# Patient Record
Sex: Female | Born: 1935 | Race: White | Hispanic: No | Smoking: Former smoker
Health system: Southern US, Community
[De-identification: ages and names within clinical notes are randomized; demographics above are authoritative.]

## PROBLEM LIST (undated history)

## (undated) DIAGNOSIS — I10 Essential (primary) hypertension: Secondary | ICD-10-CM

## (undated) DIAGNOSIS — G4700 Insomnia, unspecified: Secondary | ICD-10-CM

## (undated) DIAGNOSIS — E785 Hyperlipidemia, unspecified: Secondary | ICD-10-CM

## (undated) DIAGNOSIS — Z860100 Personal history of colon polyps, unspecified: Secondary | ICD-10-CM

## (undated) DIAGNOSIS — M1990 Unspecified osteoarthritis, unspecified site: Secondary | ICD-10-CM

## (undated) DIAGNOSIS — F419 Anxiety disorder, unspecified: Secondary | ICD-10-CM

## (undated) DIAGNOSIS — Z8709 Personal history of other diseases of the respiratory system: Secondary | ICD-10-CM

## (undated) HISTORY — PX: DILATION AND CURETTAGE OF UTERUS: SHX78

## (undated) HISTORY — DX: Unspecified osteoarthritis, unspecified site: M19.90

## (undated) HISTORY — DX: Anxiety disorder, unspecified: F41.9

## (undated) HISTORY — PX: OTHER SURGICAL HISTORY: SHX169

## (undated) HISTORY — PX: TUBAL LIGATION: SHX77

## (undated) HISTORY — DX: Hyperlipidemia, unspecified: E78.5

## (undated) HISTORY — PX: POLYPECTOMY: SHX149

## (undated) HISTORY — DX: Insomnia, unspecified: G47.00

## (undated) HISTORY — DX: Personal history of colon polyps, unspecified: Z86.0100

## (undated) HISTORY — DX: Personal history of other diseases of the respiratory system: Z87.09

## (undated) HISTORY — DX: Essential (primary) hypertension: I10

## (undated) HISTORY — DX: Personal history of colonic polyps: Z86.010

---

## 1998-11-27 ENCOUNTER — Ambulatory Visit (HOSPITAL_COMMUNITY): Admission: RE | Admit: 1998-11-27 | Discharge: 1998-11-27 | Payer: Self-pay | Admitting: Gastroenterology

## 1998-11-27 ENCOUNTER — Encounter (INDEPENDENT_AMBULATORY_CARE_PROVIDER_SITE_OTHER): Payer: Self-pay | Admitting: *Deleted

## 2000-06-26 ENCOUNTER — Other Ambulatory Visit: Admission: RE | Admit: 2000-06-26 | Discharge: 2000-06-26 | Payer: Self-pay | Admitting: Obstetrics and Gynecology

## 2000-09-23 ENCOUNTER — Encounter (INDEPENDENT_AMBULATORY_CARE_PROVIDER_SITE_OTHER): Payer: Self-pay | Admitting: Specialist

## 2000-09-23 ENCOUNTER — Ambulatory Visit (HOSPITAL_BASED_OUTPATIENT_CLINIC_OR_DEPARTMENT_OTHER): Admission: RE | Admit: 2000-09-23 | Discharge: 2000-09-23 | Payer: Self-pay | Admitting: Plastic Surgery

## 2001-07-23 ENCOUNTER — Other Ambulatory Visit: Admission: RE | Admit: 2001-07-23 | Discharge: 2001-07-23 | Payer: Self-pay | Admitting: Obstetrics and Gynecology

## 2002-07-27 ENCOUNTER — Other Ambulatory Visit: Admission: RE | Admit: 2002-07-27 | Discharge: 2002-07-27 | Payer: Self-pay | Admitting: Obstetrics and Gynecology

## 2003-08-17 ENCOUNTER — Other Ambulatory Visit: Admission: RE | Admit: 2003-08-17 | Discharge: 2003-08-17 | Payer: Self-pay | Admitting: Obstetrics and Gynecology

## 2003-11-15 ENCOUNTER — Ambulatory Visit (HOSPITAL_COMMUNITY): Admission: RE | Admit: 2003-11-15 | Discharge: 2003-11-15 | Payer: Self-pay | Admitting: Internal Medicine

## 2004-01-25 ENCOUNTER — Ambulatory Visit: Payer: Self-pay | Admitting: Internal Medicine

## 2004-04-10 ENCOUNTER — Ambulatory Visit: Payer: Self-pay | Admitting: Internal Medicine

## 2004-05-03 ENCOUNTER — Ambulatory Visit: Payer: Self-pay | Admitting: Internal Medicine

## 2004-05-09 ENCOUNTER — Ambulatory Visit: Payer: Self-pay | Admitting: Internal Medicine

## 2004-07-10 ENCOUNTER — Ambulatory Visit: Payer: Self-pay | Admitting: Internal Medicine

## 2004-07-17 ENCOUNTER — Ambulatory Visit: Payer: Self-pay | Admitting: Internal Medicine

## 2004-08-20 ENCOUNTER — Other Ambulatory Visit: Admission: RE | Admit: 2004-08-20 | Discharge: 2004-08-20 | Payer: Self-pay | Admitting: Obstetrics and Gynecology

## 2004-11-13 ENCOUNTER — Ambulatory Visit: Payer: Self-pay | Admitting: Internal Medicine

## 2005-05-17 ENCOUNTER — Ambulatory Visit: Payer: Self-pay | Admitting: Internal Medicine

## 2005-07-18 ENCOUNTER — Ambulatory Visit: Payer: Self-pay | Admitting: Internal Medicine

## 2005-07-26 ENCOUNTER — Ambulatory Visit: Payer: Self-pay | Admitting: Internal Medicine

## 2005-11-22 ENCOUNTER — Ambulatory Visit: Payer: Self-pay | Admitting: Internal Medicine

## 2006-03-25 ENCOUNTER — Encounter: Payer: Self-pay | Admitting: Internal Medicine

## 2006-09-15 ENCOUNTER — Ambulatory Visit: Payer: Self-pay | Admitting: Internal Medicine

## 2006-10-07 ENCOUNTER — Encounter: Payer: Self-pay | Admitting: Internal Medicine

## 2006-10-10 ENCOUNTER — Ambulatory Visit: Payer: Self-pay | Admitting: Internal Medicine

## 2006-10-10 LAB — CONVERTED CEMR LAB
Alkaline Phosphatase: 43 units/L (ref 39–117)
BUN: 18 mg/dL (ref 6–23)
Basophils Absolute: 0 10*3/uL (ref 0.0–0.1)
Calcium: 9.7 mg/dL (ref 8.4–10.5)
Chloride: 101 meq/L (ref 96–112)
Cholesterol: 208 mg/dL (ref 0–200)
Creatinine, Ser: 0.9 mg/dL (ref 0.4–1.2)
Direct LDL: 118 mg/dL
GFR calc Af Amer: 80 mL/min
Glucose, Bld: 107 mg/dL — ABNORMAL HIGH (ref 70–99)
HCT: 39 % (ref 36.0–46.0)
Leukocytes, UA: NEGATIVE
MCV: 93.5 fL (ref 78.0–100.0)
Monocytes Absolute: 0.6 10*3/uL (ref 0.2–0.7)
Monocytes Relative: 10 % (ref 3.0–11.0)
Neutrophils Relative %: 43.7 % (ref 43.0–77.0)
Nitrite: NEGATIVE
Potassium: 4.2 meq/L (ref 3.5–5.1)
RDW: 11.6 % (ref 11.5–14.6)
Sodium: 138 meq/L (ref 135–145)
TSH: 1.05 microintl units/mL (ref 0.35–5.50)
Total Bilirubin: 1.1 mg/dL (ref 0.3–1.2)
Triglycerides: 144 mg/dL (ref 0–149)
Urobilinogen, UA: 0.2 (ref 0.0–1.0)
pH: 7 (ref 5.0–8.0)

## 2007-03-17 ENCOUNTER — Encounter: Payer: Self-pay | Admitting: *Deleted

## 2007-03-17 DIAGNOSIS — I10 Essential (primary) hypertension: Secondary | ICD-10-CM | POA: Insufficient documentation

## 2007-03-17 DIAGNOSIS — M1990 Unspecified osteoarthritis, unspecified site: Secondary | ICD-10-CM | POA: Insufficient documentation

## 2007-03-17 DIAGNOSIS — E785 Hyperlipidemia, unspecified: Secondary | ICD-10-CM

## 2007-03-17 DIAGNOSIS — G4700 Insomnia, unspecified: Secondary | ICD-10-CM | POA: Insufficient documentation

## 2007-03-17 DIAGNOSIS — J45909 Unspecified asthma, uncomplicated: Secondary | ICD-10-CM

## 2007-03-17 DIAGNOSIS — D126 Benign neoplasm of colon, unspecified: Secondary | ICD-10-CM

## 2007-03-17 DIAGNOSIS — Z9889 Other specified postprocedural states: Secondary | ICD-10-CM

## 2007-03-17 DIAGNOSIS — F418 Other specified anxiety disorders: Secondary | ICD-10-CM

## 2007-03-27 ENCOUNTER — Ambulatory Visit: Payer: Self-pay | Admitting: Internal Medicine

## 2007-03-27 DIAGNOSIS — M545 Low back pain, unspecified: Secondary | ICD-10-CM | POA: Insufficient documentation

## 2007-03-31 ENCOUNTER — Telehealth: Payer: Self-pay | Admitting: Internal Medicine

## 2007-04-03 ENCOUNTER — Encounter: Payer: Self-pay | Admitting: Internal Medicine

## 2007-04-10 ENCOUNTER — Ambulatory Visit: Payer: Self-pay | Admitting: Internal Medicine

## 2007-04-24 ENCOUNTER — Telehealth: Payer: Self-pay | Admitting: Internal Medicine

## 2007-04-28 ENCOUNTER — Ambulatory Visit: Payer: Self-pay | Admitting: Internal Medicine

## 2007-04-28 DIAGNOSIS — IMO0002 Reserved for concepts with insufficient information to code with codable children: Secondary | ICD-10-CM | POA: Insufficient documentation

## 2007-09-23 ENCOUNTER — Ambulatory Visit: Payer: Self-pay | Admitting: Internal Medicine

## 2007-09-23 LAB — CONVERTED CEMR LAB
ALT: 20 units/L (ref 0–35)
Albumin: 3.9 g/dL (ref 3.5–5.2)
Alkaline Phosphatase: 47 units/L (ref 39–117)
BUN: 15 mg/dL (ref 6–23)
Basophils Relative: 0.6 % (ref 0.0–3.0)
CO2: 29 meq/L (ref 19–32)
Cholesterol: 186 mg/dL (ref 0–200)
GFR calc Af Amer: 91 mL/min
GFR calc non Af Amer: 75 mL/min
HCT: 36.6 % (ref 36.0–46.0)
Hemoglobin: 12.9 g/dL (ref 12.0–15.0)
Ketones, ur: NEGATIVE mg/dL
Leukocytes, UA: NEGATIVE
MCHC: 35.2 g/dL (ref 30.0–36.0)
Monocytes Relative: 8.7 % (ref 3.0–12.0)
Neutro Abs: 2.7 10*3/uL (ref 1.4–7.7)
Platelets: 242 10*3/uL (ref 150–400)
RBC: 3.92 M/uL (ref 3.87–5.11)
RDW: 11.6 % (ref 11.5–14.6)
Specific Gravity, Urine: 1.015 (ref 1.000–1.03)
Total Bilirubin: 0.9 mg/dL (ref 0.3–1.2)
Total CHOL/HDL Ratio: 3.6
Total Protein, Urine: NEGATIVE mg/dL
Urobilinogen, UA: 0.2 (ref 0.0–1.0)
VLDL: 28 mg/dL (ref 0–40)
pH: 7 (ref 5.0–8.0)

## 2007-09-30 ENCOUNTER — Ambulatory Visit: Payer: Self-pay | Admitting: Internal Medicine

## 2007-10-01 ENCOUNTER — Encounter: Payer: Self-pay | Admitting: Internal Medicine

## 2008-02-01 ENCOUNTER — Ambulatory Visit: Payer: Self-pay | Admitting: Internal Medicine

## 2008-03-28 ENCOUNTER — Encounter: Payer: Self-pay | Admitting: Internal Medicine

## 2008-09-30 ENCOUNTER — Encounter: Payer: Self-pay | Admitting: Internal Medicine

## 2008-10-03 ENCOUNTER — Ambulatory Visit: Payer: Self-pay | Admitting: Internal Medicine

## 2008-10-03 LAB — CONVERTED CEMR LAB
ALT: 18 units/L (ref 0–35)
AST: 22 units/L (ref 0–37)
Albumin: 4.2 g/dL (ref 3.5–5.2)
BUN: 19 mg/dL (ref 6–23)
Basophils Absolute: 0 10*3/uL (ref 0.0–0.1)
Bilirubin, Direct: 0.1 mg/dL (ref 0.0–0.3)
Chloride: 106 meq/L (ref 96–112)
Cholesterol: 193 mg/dL (ref 0–200)
Eosinophils Absolute: 0.1 10*3/uL (ref 0.0–0.7)
Glucose, Bld: 108 mg/dL — ABNORMAL HIGH (ref 70–99)
HCT: 38.3 % (ref 36.0–46.0)
MCHC: 34.8 g/dL (ref 30.0–36.0)
MCV: 93.7 fL (ref 78.0–100.0)
Platelets: 236 10*3/uL (ref 150.0–400.0)
Potassium: 4.2 meq/L (ref 3.5–5.1)
RDW: 11.6 % (ref 11.5–14.6)
Sodium: 142 meq/L (ref 135–145)
Total Bilirubin: 1 mg/dL (ref 0.3–1.2)
Total Protein: 7.3 g/dL (ref 6.0–8.3)
Triglycerides: 102 mg/dL (ref 0.0–149.0)
WBC: 5.3 10*3/uL (ref 4.5–10.5)

## 2008-11-15 ENCOUNTER — Ambulatory Visit: Payer: Self-pay | Admitting: Internal Medicine

## 2009-04-18 ENCOUNTER — Encounter (INDEPENDENT_AMBULATORY_CARE_PROVIDER_SITE_OTHER): Payer: Self-pay | Admitting: *Deleted

## 2009-10-25 ENCOUNTER — Encounter: Payer: Self-pay | Admitting: Internal Medicine

## 2009-10-25 ENCOUNTER — Ambulatory Visit: Payer: Self-pay | Admitting: Internal Medicine

## 2009-11-16 ENCOUNTER — Encounter: Payer: Self-pay | Admitting: Internal Medicine

## 2009-11-21 ENCOUNTER — Encounter (INDEPENDENT_AMBULATORY_CARE_PROVIDER_SITE_OTHER): Payer: Self-pay | Admitting: *Deleted

## 2009-12-26 ENCOUNTER — Encounter (INDEPENDENT_AMBULATORY_CARE_PROVIDER_SITE_OTHER): Payer: Self-pay | Admitting: *Deleted

## 2010-01-01 ENCOUNTER — Ambulatory Visit: Payer: Self-pay | Admitting: Internal Medicine

## 2010-01-10 ENCOUNTER — Ambulatory Visit: Payer: Self-pay | Admitting: Internal Medicine

## 2010-01-14 ENCOUNTER — Encounter: Payer: Self-pay | Admitting: Internal Medicine

## 2010-02-22 ENCOUNTER — Telehealth: Payer: Self-pay | Admitting: Internal Medicine

## 2010-03-02 ENCOUNTER — Telehealth: Payer: Self-pay | Admitting: Internal Medicine

## 2010-03-07 ENCOUNTER — Telehealth: Payer: Self-pay | Admitting: Internal Medicine

## 2010-03-18 LAB — CONVERTED CEMR LAB
ALT: 19 units/L (ref 0–35)
AST: 21 units/L (ref 0–37)
Alkaline Phosphatase: 47 units/L (ref 39–117)
BUN: 16 mg/dL (ref 6–23)
Basophils Absolute: 0 10*3/uL (ref 0.0–0.1)
Basophils Relative: 0.7 % (ref 0.0–3.0)
Bilirubin, Direct: 0.1 mg/dL (ref 0.0–0.3)
CO2: 29 meq/L (ref 19–32)
Calcium: 9.8 mg/dL (ref 8.4–10.5)
Chloride: 101 meq/L (ref 96–112)
Creatinine, Ser: 0.7 mg/dL (ref 0.4–1.2)
Direct LDL: 115.8 mg/dL
GFR calc non Af Amer: 90.05 mL/min (ref >60)
Hemoglobin: 13.4 g/dL (ref 12.0–15.0)
Lymphocytes Relative: 33.3 % (ref 12.0–46.0)
MCHC: 35.1 g/dL (ref 30.0–36.0)
MCV: 94.5 fL (ref 78.0–100.0)
Monocytes Absolute: 0.5 10*3/uL (ref 0.1–1.0)
Platelets: 263 10*3/uL (ref 150.0–400.0)
Potassium: 4 meq/L (ref 3.5–5.1)
Sodium: 140 meq/L (ref 135–145)
Total Bilirubin: 0.6 mg/dL (ref 0.3–1.2)
Total Protein: 7 g/dL (ref 6.0–8.3)
WBC: 6.1 10*3/uL (ref 4.5–10.5)

## 2010-03-20 NOTE — Letter (Signed)
Summary: Pre Visit Letter Revised  [facility]  [address] [place], [place] [address]   Phone: [phone]  Fax: [phone]        11/21/2009 MRN: [id number] [facility] [address] [place], [place]  [address]             Procedure Date:  01/10/2010   Welcome to the Gastroenterology Division at [facility].    You are scheduled to see a nurse for your pre-procedure visit on 01/01/2010 at 10:00AM on the 3rd floor at [facility], [address]  We ask that you try to arrive at our office 15 minutes prior to your appointment time to allow for check-in.  Please take a minute to review the attached form.  If you answer "Yes" to one or more of the questions on the first page, we ask that you call the person listed at your earliest opportunity.  If you answer "No" to all of the questions, please complete the rest of the form and bring it to your appointment.    Your nurse visit will consist of discussing your medical and surgical history, your immediate family medical history, and your medications.   If you are unable to list all of your medications on the form, please bring the medication bottles to your appointment and we will list them.  We will need to be aware of both prescribed and over the counter drugs.  We will need to know exact dosage information as well.    Please be prepared to read and sign documents such as consent forms, a financial agreement, and acknowledgement forms.  If necessary, and with your consent, a friend or relative is welcome to sit-in on the nurse visit with you.  Please bring your insurance card so that we may make a copy of it.  If your insurance requires a referral to see a specialist, please bring your referral form from your primary care physician.  No co-pay is required for this nurse visit.     If you cannot keep your appointment, please call [phone] to cancel or reschedule prior to your appointment  date.  This allows [place] the opportunity to schedule an appointment for another patient in need of care.    Thank you for choosing [facility] for your medical needs.  We appreciate the opportunity to care for you.  Please visit [place] at our website  to learn more about our practice.  Sincerely, The Gastroenterology Division

## 2010-03-20 NOTE — Assessment & Plan Note (Signed)
Summary: YEARLY/WILL COME FASTING/#/CD   Vital Signs:  Patient profile:   76 year old female Height:      60 inches Weight:      127 pounds BMI:     24.89 O2 Sat:      97 % on Room air Temp:     97.9 degrees F oral Pulse rate:   70 / minute BP sitting:   128 / 80  (left arm) Cuff size:   regular  O2 Flow:  Room air         Flu Vaccine Consent Questions     Do you have a history of severe allergic reactions to this vaccine? no    Any prior history of allergic reactions to egg and/or gelatin? no    Do you have a sensitivity to the preservative Thimersol? no    Do you have a past history of Guillan-Barre Syndrome? no    Do you currently have an acute febrile illness? no    Have you ever had a severe reaction to latex? no    Vaccine information given and explained to patient? yes    Are you currently pregnant? no    Lot Number:[id number]   Exp Date:08/17/2009   Site Given  Left Deltoid IM  CC: yearly/ ab  Vision Screening:      Vision Comments: June 2011 Normal at [facility]   Primary Care Provider:  Norins  CC:  yearly/ ab.  History of Present Illness: Patient presents for general health maintenance exam. she is feeling well and has no active complaints. 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 She will be seeing Dr. Senaida Ores for gyn in the next month.  She is very active - canning, seeing her grandchildren. She is 100% independent in  all ADLs. She manages her own affairs: shopping, keeping the household, keeping track of the achievements of her grandchildren.  Preventive Screening-Counseling & Management  Alcohol-Tobacco     Alcohol drinks/day: 1     Alcohol type: wine     Smoking Status: current     Year Quit: quit 33 years ago  Caffeine-Diet-Exercise     Caffeine use/day: 1 cup     Does Patient Exercise: yes     Type of exercise: walking     Times/week: 6  Hep-HIV-STD-Contraception     Dental Visit-last 6 months yes     Sun Exposure-Excessive: no  Safety-Violence-Falls     Seat Belt Use: yes     Firearms in the Home: firearms in the home     Smoke Detectors: yes     Violence in the Home: no risk noted     Sexual Abuse: no     Fall Risk: low fall risk  Current Medications (verified): 1)  Multivitamins   Tabs (Multiple Vitamin) .... Take One Tablet Once Daily 2)  Lotrel 10-20 Mg  Caps (Amlodipine Besy-Benazepril Hcl) .... Take One Tablet Once Daily 3)  Hydrochlorothiazide 25 Mg  Tabs (Hydrochlorothiazide) .... Take One Tablet Once Daily 4)  Calcium 600 1500 Mg  Tabs (Calcium Carbonate) .... Take 1 Tablet By Mouth Two Times A Day 5)  Aspirin 81 Mg  Tabs (Aspirin) .... Take One Tablet Once Daily 6)  Glucosamine Chondroitin Complx   Tabs (Glucosamine-Chondroit-Vit C-Mn) .... Take One Tablet Twice Daily 7)  Lipitor 10 Mg  Tabs (Atorvastatin Calcium) .... 1/2 Per Day  Allergies (verified): No Known Drug Allergies  Past History:  Past Medical History: Last updated: 03/27/2007 Hx of INSOMNIA (ICD-780.52) Hx of ANXIETY (ICD-300.00) Hx of POLYP, COLON (ICD-211.3) DEGENERATIVE JOINT DISEASE (ICD-715.90) HYPERLIPIDEMIA (ICD-272.4) Hx of ASTHMA (ICD-493.90) HYPERTENSION (ICD-401.9) Osteopenia  Past Surgical History: Last updated: 03/17/2007 POLYPECTOMY, HX OF (ICD-V15.9) DILATION AND CURETTAGE, HX OF (ICD-V45.89) TUBAL LIGATION, HX OF (ICD-V26.51)    Family History: Last updated:  Oct 11, 2007 father- deceased @60 : colon cancer, had had a blow to the abdomen in an MVA mother - deceased @ 44: GI issues/cancer, HTN Neg- breast cancer; DM  Social History: Last updated: 2007/10/11 HSG Married '61 2 sons - '69, '78; 2 daughtes - '63, '65; 4 grandchildren 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    Physical Exam  General:  Well-developed,well-nourished,in no acute distress; alert,appropriate and cooperative throughout examination Head:  Normocephalic and atraumatic without obvious abnormalities.  No apparent alopecia or balding. Eyes:  No corneal or conjunctival inflammation noted. EOMI. Perrla. Funduscopic exam benign, without hemorrhages, exudates or papilledema. Vision grossly normal. Ears:  External ear exam shows no significant lesions or deformities.  Otoscopic examination reveals clear canals, tympanic membranes are intact bilaterally without bulging, retraction, inflammation or discharge. Hearing is grossly normal bilaterally. Nose:  no external deformity and no external erythema.   Mouth:  Oral mucosa and oropharynx without lesions or exudates.  Teeth in good repair. Neck:  supple, full ROM, no thyromegaly, no carotid bruits, and no cervical lymphadenopathy.   Chest Wall:  No deformities, masses, or tenderness noted. Breasts:  deferred to gyn  Lungs:  Normal respiratory effort, chest expands symmetrically. Lungs are clear to auscultation, no crackles or  wheezes. Heart:  normal rate, regular rhythm, no murmur, no gallop, no JVD, and no HJR.   Abdomen:  soft, non-tender, normal bowel sounds, no guarding, and no hepatomegaly.   Genitalia:  deferred to gyn  Msk:  enlarged PIP/DIP joints. Trigger finger 4th right. normal ROM and no joint warmth.   Pulses:  2+ radial and DP pulses Extremities:  No clubbing, cyanosis, edema, or deformity noted with normal full range of motion of all joints.   Neurologic:  alert & oriented X3, cranial nerves II-XII intact, strength normal in all extremities, gait normal, and DTRs symmetrical and normal.   Skin:  macular lesion right and left cheeks. No other suspicious lesions Cervical Nodes:  no anterior cervical adenopathy and no posterior cervical adenopathy.   Psych:  Oriented X3, memory intact for recent and remote, normally interactive, and good eye contact.     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 Shehas no limitations in her activities.  Her updated medication list for this problem includes:    Aspirin 81 Mg Tabs (Aspirin) .Marland Kitchen... Take one tablet once daily  Problem # 2:  Hx of ANXIETY (ICD-300.00) Although her home situation is challenging to her she is doing well. She continues to do  activities that please her.  Problem # 3:  DEGENERATIVE JOINT DISEASE (ICD-715.90) No active complaints at this visit. She remains very active.  Her updated medication list for this problem includes:    Aspirin 81 Mg Tabs (Aspirin) .Marland Kitchen... Take one tablet once daily  Problem # 4:  HYPERLIPIDEMIA (ICD-272.4) Due for follow-up lab with recommendations to follow.  Her updated medication list for this problem includes:    Lipitor 10 Mg Tabs (Atorvastatin calcium) .Marland Kitchen... 1/2 per day  Orders: TLB-Lipid Panel (80061-LIPID) TLB-Hepatic/Liver Function Pnl (80076-HEPATIC) TLB-TSH (Thyroid Stimulating Hormone) (84443-TSH)  Addendum - LDL at 115 is at goal. Liver  functions are normal.  Plan - continue resent medications.  Problem # 5:  HYPERTENSION (ICD-401.9)  Her updated medication list for this problem includes:    Lotrel 10-20 Mg Caps (Amlodipine besy-benazepril hcl) .Marland Kitchen... Take one tablet once daily    Hydrochlorothiazide 25 Mg Tabs (Hydrochlorothiazide) .Marland Kitchen... Take one tablet once daily  Orders: TLB-BMP (Basic Metabolic Panel-BMET) (80048-METABOL)  BP today: 128/80 Prior BP: 132/76 (10/03/2008)  Good control of BP. Labs are normal.  Plan - continue resent medications.  Problem # 6:  Preventive Health Care (ICD-V70.0)  Unremarkable history since her last visit. She does have very mild onychyomycosis of the the toe nails and wants treatment - lamisil 250mg  once daily x 12 weeks. Physical exam is normal- gyn exam deferred, breast exam deferred. She had colonoscopy '06.  She is cvurrent with pneumonia vaccine. Flu shot today. She is a candidate for shingles vaccine at a later date. 12 lead EKG normal  Ms. Wentworth  has a difficult relationship with her husband but has no signs or symptoms of depression. She is cognitively intact and independent in ADLs  as reviewed in HPI. She has no fall or injury risk. She is counselled to continue all her medications, to remain active and to possibly increase her aerobic exercise if possible, i.e. walking daily.  In summary - avery nice woman who is medically stable. She will return as needed or in 1 year.  Orders: [facility] -Subsequent Annual Wellness Visit [phone])  Complete Medication List: 1)  Multivitamins Tabs (Multiple vitamin) .... Take one tablet once daily 2)  Lotrel 10-20 Mg Caps (Amlodipine besy-benazepril hcl) .... Take one tablet once daily 3)  Hydrochlorothiazide 25 Mg Tabs (Hydrochlorothiazide) .... Take one tablet once daily 4)  Calcium 600 1500 Mg Tabs (Calcium carbonate) .... Take 1 tablet by mouth two times a day 5)  Aspirin 81 Mg Tabs (Aspirin) .... Take one tablet once daily 6)  Glucosamine  Chondroitin Complx Tabs (Glucosamine-chondroit-vit c-mn) .... Take one tablet twice daily 7)  Lipitor 10 Mg Tabs (Atorvastatin calcium) .... 1/2 per day 8)  Terbinafine Hcl 250 Mg Tabs (Terbinafine hcl) .Marland Kitchen.. 1 by mouth once daily x 12 weeks for nail fungus  Other Orders: Flu Vaccine 61yrs + (60454) Administration Flu vaccine - MCR (G0008) EKG w/ Interpretation (93000) TLB-CBC Platelet - w/Differential (85025-CBCD)   Patient: Whitney Benson Note: All result statuses are Final unless otherwise noted.  Tests: (1) Lipid Panel (LIPID)   Cholesterol          [H]  202 mg/dL                   0-981     ATP III Classification            Desirable:  < 200 mg/dL                    Borderline High:  200 - 239 mg/dL               High:  > = 240 mg/dL   Triglycerides             149.0 mg/dL                 1.9-147.8     Normal:  <150 mg/dL     Borderline High:  295 - 199 mg/dL   HDL                       62.13 mg/dL                 >08.65   VLDL Cholesterol          29.8 mg/dL                  7.8-46.9  CHO/HDL Ratio:  CHD Risk                             3                    Men          Women     1/2 Average Risk     3.4  3.3     Average Risk          5.0          4.4     2X Average Risk          9.6          7.1     3X Average Risk          15.0          11.0                           Tests: (2) Hepatic/Liver Function Panel (HEPATIC)   Total Bilirubin           0.6 mg/dL                   8.1-1.9   Direct Bilirubin          0.1 mg/dL                   1.4-7.8   Alkaline Phosphatase      47 U/L                      39-117   AST                       21 U/L                      0-37   ALT                       19 U/L                      0-35   Total Protein             7.0 g/dL                    2.9-5.6   Albumin                   4.3 g/dL                    2.1-3.0  Tests: (3) BMP (METABOL)   Sodium                    140 mEq/L                   135-145   Potassium                  4.0 mEq/L                   3.5-5.1   Chloride                  101 mEq/L                   96-112   Carbon Dioxide            29 mEq/L                    19-32   Glucose                   96 mg/dL  70-99   BUN                       16 mg/dL                    1-61   Creatinine                0.7 mg/dL                   0.9-6.0   Calcium                   9.8 mg/dL                   4.5-40.9   GFR                       90.05 mL/min                >60  Tests: (4) CBC Platelet w/Diff (CBCD)   White Cell Count          6.1 K/uL                    4.5-10.5   Red Cell Count            4.06 Mil/uL                 3.87-5.11   Hemoglobin                13.4 g/dL                   81.1-91.4   Hematocrit                38.4 %                      36.0-46.0   MCV                       94.5 fl                     78.0-100.0   MCHC                      35.1 g/dL                   78.2-95.6   RDW                       12.8 %                      11.5-14.6   Platelet Count            263.0 K/uL                  150.0-400.0   Neutrophil %              57.1 %                      43.0-77.0   Lymphocyte %              33.3 %                      12.0-46.0   Monocyte %  7.6 %                       3.0-12.0   Eosinophils%              1.3 %                       0.0-5.0   Basophils %               0.7 %                       0.0-3.0   Neutrophill Absolute      3.5 K/uL                    1.4-7.7   Lymphocyte Absolute       2.0 K/uL                    0.7-4.0   Monocyte Absolute         0.5 K/uL                    0.1-1.0  Eosinophils, Absolute                             0.1 K/uL                    0.0-0.7   Basophils Absolute        0.0 K/uL                    0.0-0.1  Tests: (5) TSH (TSH)   FastTSH                   0.86 uIU/mL                 0.35-5.50  Tests: (6) Cholesterol LDL - Direct (DIRLDL)  Cholesterol LDL - Direct                             115.8 mg/dL      Optimal:  <161 mg/dL     Near or Above Optimal:  100-129 mg/dL     Borderline High:  096-045 mg/dL     High:  409-811 mg/dLPrescriptions: TERBINAFINE HCL 250 MG TABS (TERBINAFINE HCL) 1 by mouth once daily x 12 weeks for nail fungus  #30 x 2   Entered and Authorized by:   Jacques Navy MD   Signed by:   Jacques Navy MD on 10/26/2009   Method used:   Electronically to        [facility]* (retail)       [address] [place], [place]  [address]       Ph: [phone] or [phone]       Fax: [phone]   RxID:   [id number] LIPITOR 10 MG  TABS (ATORVASTATIN CALCIUM) 1/2 per day  #15 x 12   Entered and Authorized by:   Jacques Navy MD   Signed by:   Jacques Navy MD on 10/25/2009   Method used:   Electronically to        [facility]* (retail)  [address] [place], [place]  [address]       Ph: [phone] or [phone]       Fax: [phone]   RxID:   [id number] HYDROCHLOROTHIAZIDE 25 MG  TABS (HYDROCHLOROTHIAZIDE) Take one tablet once daily  #30 x 12   Entered and Authorized by:   Jacques Navy MD   Signed by:   Jacques Navy MD on 10/25/2009   Method used:   Electronically to        [facility]* (retail)       [address] [place], [place]  [address]       Ph: [phone] or [phone]       Fax: [phone]   RxID:   [id number] LOTREL 10-20 MG  CAPS (AMLODIPINE BESY-BENAZEPRIL HCL) Take one tablet once daily  #30 x 12   Entered and Authorized by:   Jacques Navy MD   Signed by:   Jacques Navy MD on 10/25/2009   Method used:   Electronically to        [facility]* (retail)       [address] [place], [place]  [address]       Ph: [phone] or [phone]       Fax:  [phone]   RxID:   [id number]

## 2010-03-20 NOTE — Letter (Signed)
Summary: Patient Notice- Polyp Results  [facility]  [address] [place], [place] [address]   Phone: [phone]  Fax: [phone]        January 14, 2010 MRN: [id number]    [facility] [address] [place], [place]  [address]    Dear Ms. Mcclellan,  The polyp removed from your colon was adenomatous. This means that it was pre-cancerous or that  it had the potential to change into cancer over time.  I recommend that you have a repeat colonoscopy in 5 years to determine if you have developed any new polyps over time and screen for colorectal cancer. (If sensible considering your health at that time). If you develop any new rectal bleeding, abdominal pain or significant bowel habit changes, please contact us before then.  Please call us if you are having persistent problems or have questions about your condition that have not been fully answered at this time.  Sincerely,  Iva Boop MD, [facility]  This letter has been electronically signed by your physician.  Appended Document: Patient Notice- Polyp Results Letter mailed

## 2010-03-20 NOTE — Letter (Signed)
Summary: [facility] Instructions  [facility]  [address] [place], [place] [address]   Phone: [phone]  Fax: [phone]       Whitney Benson    1934/07/16    MRN: [id number]        Procedure Day Dorna Bloom:  Wednesday 01/10/2010     Arrival Time: 8:30 am      Procedure Time: 9:30 am     Location of Procedure:                    _x _  [facility] (4th Floor)                        PREPARATION FOR COLONOSCOPY WITH MOVIPREP   Starting 5 days prior to your procedure Friday 11/18 do not eat nuts, seeds, popcorn, corn, beans, peas,  salads, or any raw vegetables.  Do not take any fiber supplements (e.g. Metamucil, Citrucel, and Benefiber).  THE DAY BEFORE YOUR PROCEDURE         DATE: Tuesday 11/22  1.  Drink clear liquids the entire day-NO SOLID FOOD  2.  Do not drink anything colored red or purple.  Avoid juices with pulp.  No orange juice.  3.  Drink at least 64 oz. (8 glasses) of fluid/clear liquids during the day to prevent dehydration and help the prep work efficiently.  CLEAR LIQUIDS INCLUDE: Water Jello Ice Popsicles Tea (sugar ok, no milk/cream) Powdered fruit flavored drinks Coffee (sugar ok, no milk/cream) Gatorade Juice: apple, white grape, white cranberry  Lemonade Clear bullion, consomm, broth Carbonated beverages (any kind) Strained chicken noodle soup Hard Candy                             4.  In the morning, mix first dose of MoviPrep solution:    Empty 1 Pouch A and 1 Pouch B into the disposable container    Add lukewarm drinking water to the top line of the container. Mix to dissolve    Refrigerate (mixed solution should be used within 24 hrs)  5.  Begin drinking the prep at 5:00 p.m. The MoviPrep container is divided by 4 marks.   Every 15 minutes drink the solution down to the next mark (approximately 8 oz) until the full liter is complete.   6.  Follow completed prep with 16 oz of clear liquid of your choice  (Nothing red or purple).  Continue to drink clear liquids until bedtime.  7.  Before going to bed, mix second dose of MoviPrep solution:    Empty 1 Pouch A and 1 Pouch B into the disposable container    Add lukewarm drinking water to the top line of the container. Mix to dissolve    Refrigerate  THE DAY OF YOUR PROCEDURE      DATE: Wednesday 11/23  Beginning at 4:30 a.m. (5 hours before procedure):         1. Every 15 minutes, drink the solution down to the next mark (approx 8 oz) until the full liter is complete.  2. Follow completed prep with 16 oz. of clear liquid of your choice.    3. You may drink clear liquids until 7:30 am (2 HOURS BEFORE PROCEDURE).   MEDICATION INSTRUCTIONS  Unless otherwise instructed, you should take regular prescription medications with a small sip of water   as early as possible the morning of  your procedure.   Additional medication instructions: Do not take HCTZ day of procedure.         OTHER INSTRUCTIONS  You will need a responsible adult at least 74 years of age to accompany you and drive you home.   This person must remain in the waiting room during your procedure.  Wear loose fitting clothing that is easily removed.  Leave jewelry and other valuables at home.  However, you may wish to bring a book to read or  an iPod/MP3 player to listen to music as you wait for your procedure to start.  Remove all body piercing jewelry and leave at home.  Total time from sign-in until discharge is approximately 2-3 hours.  You should go home directly after your procedure and rest.  You can resume normal activities the  day after your procedure.  The day of your procedure you should not:   Drive   Make legal decisions   Operate machinery   Drink alcohol   Return to work  You will receive specific instructions about eating, activities and medications before you leave.    The above instructions have been reviewed and explained to me  by   Ezra Sites RN  January 01, 2010 10:08 AM    I fully understand and can verbalize these instructions _____________________________ Date _________

## 2010-03-20 NOTE — Miscellaneous (Signed)
Summary: [facility]  Clinical Lists Changes  Medications: Added new medication of MOVIPREP 100 GM  SOLR (PEG-KCL-NACL-NASULF-NA ASC-C) As per prep instructions. - Signed Rx of MOVIPREP 100 GM  SOLR (PEG-KCL-NACL-NASULF-NA ASC-C) As per prep instructions.;  #1 x 0;  Signed;  Entered by: Ezra Sites RN;  Authorized by: Iva Boop MD, FACG;  Method used: Electronically to [facility]*, [address] [place], [place]  [address], Ph: [phone] or [phone], Fax: [phone] Observations: Added new observation of NKA: T (01/01/2010 9:47)    Prescriptions: MOVIPREP 100 GM  SOLR (PEG-KCL-NACL-NASULF-NA ASC-C) As per prep instructions.  #1 x 0   Entered by:   Ezra Sites RN   Authorized by:   Iva Boop MD, [facility]   Signed by:   Ezra Sites RN on 01/01/2010   Method used:   Electronically to        [facility]* (retail)       [address] [place], [place]  [address]       Ph: [phone] or [phone]       Fax: [phone]   RxID:   [id number]

## 2010-03-20 NOTE — Procedures (Signed)
Summary: Colonoscopy  Patient: Whitney Benson Note: All result statuses are Final unless otherwise noted.  Tests: (1) Colonoscopy (COL)   COL Colonoscopy           DONE     [facility]     [address]     [place], [place]  [address]           COLONOSCOPY PROCEDURE REPORT           PATIENT:  Whitney Benson, Whitney Benson  MR#:  [id number]     BIRTHDATE:  01/24/1935, 75 yrs. old  GENDER:  female     ENDOSCOPIST:  Iva Boop, MD, [facility]           PROCEDURE DATE:  01/10/2010     PROCEDURE:  Colonoscopy with biopsy     ASA CLASS:  Class II     INDICATIONS:  Elevated Risk Screening, family history of colon     cancer father died at age 67 from colon cancer     MEDICATIONS:   Fentanyl 50 mcg IV, Versed 5 mg IV           DESCRIPTION OF PROCEDURE:   After the risks benefits and     alternatives of the procedure were thoroughly explained, informed     consent was obtained.  Digital rectal exam was performed and     revealed no abnormalities.   The LB CF-[id number] endoscope     was introduced through the anus and advanced to the cecum, which     was identified by both the appendix and ileocecal valve, without     limitations.  The quality of the prep was excellent, using     MoviPrep.  The instrument was then slowly withdrawn as the colon     was fully examined. Insertion: 5:00 minutes Withdrawal: 8:46     minutes     <<PROCEDUREIMAGES>>           FINDINGS:  A diminutive polyp was found in the ascending colon.     The polyp was removed using cold biopsy forceps.  Mild     diverticulosis was found in the sigmoid colon.  This was otherwise     a normal examination of the colon.   Retroflexed views in the     rectum revealed it was not tolerated by the patient.    The scope     was then withdrawn from the patient and the procedure completed.           COMPLICATIONS:  None     ENDOSCOPIC IMPRESSION:     1) Diminutive polyp in the ascending colon     2) Mild diverticulosis in the sigmoid  colon     3) Otherwise normal examination     4) Family history of colon cancer (father deceased at 61)           REPEAT EXAM:  In for Colonoscopy, pending biopsy results. likely 5     years           Iva Boop, MD, [facility]           CC:  The Patient           n.     eSIGNED:   Iva Boop at 01/10/2010 10:32 AM           Whitney Benson, [id number]  Note: An exclamation mark (!) indicates a result that was not dispersed into the flowsheet. Document Creation  Date: 01/10/2010 10:32 AM _______________________________________________________________________  (1) Order result status: Final Collection or observation date-time: 01/10/2010 10:23 Requested date-time:  Receipt date-time:  Reported date-time:  Referring Physician:   Ordering Physician: Stan Head [phone]) Specimen Source:  Source: Launa Grill Order Number: [id number] Lab site:   Appended Document: Colonoscopy   Colonoscopy  Procedure date:  01/10/2010  Findings:          1) Diminutive polyp in the ascending colon     2) Mild diverticulosis in the sigmoid colon     3) Otherwise normal examination     4) Family history of colon cancer (father deceased at 39)  1. Colon, polyp(s), ascending :  TUBULAR ADENOMA.  NO HIGH GRADE DYSPLASIA OR MALIGNANCY IDENTIFIED.  Comments:      Repeat colonoscopy in 5 years.   Procedures Next Due Date:    Colonoscopy: 01/2015   Appended Document: Colonoscopy     Procedures Next Due Date:    Colonoscopy: 01/2015

## 2010-03-20 NOTE — Letter (Signed)
Summary: Juluis Mire MD  Juluis Mire MD   Imported By: Lester Waterville 11/30/2009 09:53:14  _____________________________________________________________________  External Attachment:    Type:   Image     Comment:   External Document

## 2010-03-20 NOTE — Letter (Signed)
Summary: Colonoscopy Letter  [facility]  [address] [place], [place] [address]   Phone: [phone]  Fax: [phone]      April 18, 2009 MRN: [id number]   [facility] [address] [place], [place]  [address]   Dear Ms. Brancato,   According to your medical record, it is time for you to schedule a Colonoscopy. The American Cancer Society recommends this procedure as a method to detect early colon cancer. Patients with a family history of colon cancer, or a personal history of colon polyps or inflammatory bowel disease are at increased risk.  This letter has beeen generated based on the recommendations made at the time of your procedure. If you feel that in your particular situation this may no longer apply, please contact our office.  Please call our office at [phone] to schedule this appointment or to update your records at your earliest convenience.  Thank you for cooperating with [place] to provide you with the very best care possible.   Sincerely,  Iva Boop, M.D.  [facility] [phone]

## 2010-03-22 NOTE — Progress Notes (Signed)
Summary: med change?  Phone Note From Pharmacy Call back at [phone]   Caller: [facility] Summary of Call: [facility] called regarding pt's Lipitor rx. Insurance will no longer cover Lipitor and they are requesting pt be switched to Zocor Initial call taken by: Brenton Grills CMA Duncan Dull),  February 22, 2010 11:33 AM  Follow-up for Phone Call        ok to change to simvatatin 20mg  qPM  Follow-up by: Jacques Navy MD,  February 22, 2010 1:03 PM    New/Updated Medications: SIMVASTATIN 20 MG TABS (SIMVASTATIN) 1 by mouth qPM Prescriptions: SIMVASTATIN 20 MG TABS (SIMVASTATIN) 1 by mouth qPM  #30 x 12   Entered and Authorized by:   Jacques Navy MD   Signed by:   Jacques Navy MD on 02/22/2010   Method used:   Electronically to        [facility]* (retail)       [address] [place], [place]  [address]       Ph: [phone] or [phone]       Fax: [phone]   RxID:   [id number]

## 2010-03-22 NOTE — Progress Notes (Signed)
  Phone Note Call from Patient Call back at Home Phone [phone]   Caller: [phone] Call For: Jacques Navy MD Summary of Call: Pt out of Lipitor, spouse states form will be faxed today from [facility].Will need signed and sent back to drug store. Initial call taken by: Verdell Face,  March 02, 2010 9:41 AM  Follow-up for Phone Call        Pt advised that she spoke with [employer] and they will now cover Lipitor at $6/month and pt would like to be changed back. Okay to change rx and sent to [facility]? Follow-up by: Margaret Pyle, CMA,  March 02, 2010 10:27 AM  Additional Follow-up for Phone Call Additional follow up Details #1::        ok for lipitor 20mg  #30, refill x 12, sig 1 by mouth qPM. eScribed to drug store. Additional Follow-up by: Jacques Navy MD,  March 02, 2010 10:55 AM    Additional Follow-up for Phone Call Additional follow up Details #2::    Pt advised Follow-up by: Margaret Pyle, CMA,  March 02, 2010 1:56 PM  New/Updated Medications: LIPITOR 20 MG TABS (ATORVASTATIN CALCIUM) 1 by mouth qPM Prescriptions: LIPITOR 20 MG TABS (ATORVASTATIN CALCIUM) 1 by mouth qPM  #30 x 12   Entered and Authorized by:   Jacques Navy MD   Signed by:   Jacques Navy MD on 03/02/2010   Method used:   Electronically to        [facility] (retail)       [address] [place], [place]  [address]       Ph: [phone] or [phone]       Fax: [phone]   RxID:   [id number]

## 2010-03-28 NOTE — Progress Notes (Signed)
Summary: PA-Lipitor  Phone Note Call from Patient   Summary of Call: Pt and pharm called regarding lipitor. We did lipitor PA form yesterday.  Initial call taken by: Lamar Sprinkles, CMA,  March 07, 2010 11:34 AM  Follow-up for Phone Call        PA Faxed to [facility] @ [phone], awaiting approval. Dagoberto Reef  March 07, 2010 4:39 PM   PA WAS DENIED. Per pharmacy- pt can get this med for approx 45$. Pt was spoken to [employer] and they advised our office can get cost share reduction to get med for 6/mth. Request was denied.  Pt must have tried and failed pravastatin and lovastatin. Are there any clinical reasons why the patient should not take either of those options? If yes, we can resubmit the information to insurance for review, otherwise pt needs rx for either.   FYI- simvastatin may have same problem b/c insurance only prefers the 2 above mention options.  Follow-up by: Lamar Sprinkles, CMA,  March 17, 2010 1:59 PM  Additional Follow-up for Phone Call Additional follow up Details #1::        So...the patient has been very well controlled on lipitor but this is no longer covered!!   Plan - trial of lovastatin 40mg  once daily. repeat lipid panel in  3 weeks 272.4, hepatic 995.20. If she fails lovastatin will not try pravastatin an even weaker statin product., but apply again for PA. Additional Follow-up by: Jacques Navy MD,  March 18, 2010 6:03 PM    Additional Follow-up for Phone Call Additional follow up Details #2::    Spoke w/pt's husband - pharm filled simvastatin 20mg  (previously rx'd) w/no problems. Med list updated................Marland KitchenLamar Sprinkles, CMA  March 19, 2010 4:14 PM  Follow-up by: Jacques Navy MD,  March 19, 2010 5:49 PM  New/Updated Medications: SIMVASTATIN 20 MG TABS (SIMVASTATIN) 1 at bedtime

## 2010-03-30 ENCOUNTER — Other Ambulatory Visit: Payer: Medicare Other

## 2010-03-30 ENCOUNTER — Encounter (INDEPENDENT_AMBULATORY_CARE_PROVIDER_SITE_OTHER): Payer: Self-pay | Admitting: *Deleted

## 2010-03-30 ENCOUNTER — Other Ambulatory Visit: Payer: Self-pay | Admitting: Internal Medicine

## 2010-03-30 DIAGNOSIS — T887XXA Unspecified adverse effect of drug or medicament, initial encounter: Secondary | ICD-10-CM

## 2010-03-30 LAB — LIPID PANEL
HDL: 57.4 mg/dL (ref >39.00)
LDL Cholesterol: 96 mg/dL (ref 0–99)
Triglycerides: 179 mg/dL — ABNORMAL HIGH (ref 0.0–149.0)

## 2010-03-30 LAB — HEPATIC FUNCTION PANEL
ALT: 19 U/L (ref 0–35)
Total Protein: 6.5 g/dL (ref 6.0–8.3)

## 2010-04-08 ENCOUNTER — Encounter: Payer: Self-pay | Admitting: Internal Medicine

## 2010-04-17 NOTE — Letter (Signed)
[facility] [address] [place], [place]  [address] Phone: [phone]      April 09, 2010   [facility] [address] [place], [place] [address]  RE:  LAB RESULTS  Dear  [name],  The following is an interpretation of your most recent lab tests.  Please take note of any instructions provided or changes to medications that have resulted from your lab work.  LIVER FUNCTION TESTS:  Good - no changes needed  LIPID PANEL:  Good - no changes needed Triglyceride: 179.0   Cholesterol: 189   LDL: 96   HDL: 57.40   Chol/HDL%:  3   Great control of cholesterol. Keep up the good work and keep taking the medication.   Sincerely Yours,    Jacques Navy MD Patient: Whitney Benson Note: All result statuses are Final unless otherwise noted.  Tests: (1) Lipid Panel (LIPID)   Cholesterol               189 mg/dL                   2-831     ATP III Classification            Desirable:  < 200 mg/dL                    Borderline High:  200 - 239 mg/dL               High:  > = 240 mg/dL   Triglycerides        [H]  179.0 mg/dL                 5.1-761.6     Normal:  <150 mg/dL     Borderline High:  073 - 199 mg/dL   HDL                       71.06 mg/dL                 >26.94   VLDL Cholesterol          35.8 mg/dL                  8.5-46.2   LDL Cholesterol           96 mg/dL                    7-03  CHO/HDL Ratio:  CHD Risk                             3                    Men          Women     1/2 Average Risk     3.4          3.3     Average Risk          5.0          4.4     2X Average Risk          9.6          7.1     3X Average Risk          15.0          11.0  Tests: (2) Hepatic/Liver Function Panel (HEPATIC)   Total Bilirubin      [L]  0.2 mg/dL                   4.5-4.0   Direct Bilirubin          0.0 mg/dL                   9.8-1.1   Alkaline Phosphatase      49 U/L                      39-117   AST                        22 U/L                      0-37   ALT                       19 U/L                      0-35   Total Protein             6.5 g/dL                    9.1-4.7   Albumin                   3.7 g/dL                    8.2-9.5

## 2010-05-21 ENCOUNTER — Other Ambulatory Visit: Payer: Self-pay | Admitting: Internal Medicine

## 2010-05-24 ENCOUNTER — Other Ambulatory Visit: Payer: Self-pay | Admitting: Internal Medicine

## 2010-05-24 MED ORDER — DIAZEPAM 5 MG PO TABS: 5.0000 mg | ORAL_TABLET | Freq: Four times a day (QID) | ORAL | Status: AC | PRN

## 2010-07-03 NOTE — Assessment & Plan Note (Signed)
[facility]                           PRIMARY CARE OFFICE NOTE   NAME:Whitney Benson, Nevin                        MRN:          [id number]  DATE:09/15/2006                            DOB:          [birth year]    Ms. Whitney Benson is a 76 year old woman who presents for followup evaluation  and exam.  She is followed for hypertension, hyperlipidemia.   The patient was last seen in the office July 26, 2005 and in the interval  has had a good year with no significant medical problems or changes in  her condition.  She continues to have chronic low back pain.  She has  some hip pain, both relieved by Aleve.  She also complains of possible  decreased hearing.   PAST MEDICAL HISTORY:   SURGICAL:  1. BTL.  2. History of dilation and curettage.   MEDICAL:  1. Usual childhood disease.  2. Hypertension.  3. History of asthma, now quiescent.  4. Hyperlipidemia.  5. Degenerative joint disease.  6. History of colon polyps.  7. Anxiety and insomnia.   CURRENT MEDICATIONS:  1. Lipitor 5 mg daily.  2. Multivitamin daily.  3. Lotrel 10/20 once daily.  4. Hydrochlorothiazide 25 mg daily.  5. Viactiv 1 daily.  6. Aspirin 81 mg daily.  7. Glucosamine chondroitin.  8. Vitamin C daily.   FAMILY HISTORY:  Sister with ear, nose, and throat cancer, and lung  cancer.  No family history for colon cancer or breast cancer.   SOCIAL HISTORY:  The patient is married for greater than 42 years.  She  has 2 daughters, 2 sons, 2 grandchildren.  The patient's marriage is  stable.   HEALTH MAINTENANCE:  The patient's last bone density study dates from  2005, which showed mild osteopenia.  The patient reports that she is  current with mammography, but does not know the specific date.  Last  pelvic and Pap smear was August of 2007.  Last colonoscopy was May 03, 2004 with followup recommended in 2011.   IMMUNIZATIONS:  The patient has had pneumonia vaccine November 2000.  She had  a tetanus booster possibly 9 years ago.   PHYSICIAN ROSTER:  Dionne Ano. Amanda Pea, M.D. for orthopedics.  Juluis Mire, M.D. for gynecology.  John C. Madilyn Fireman, M.D. for GI.   REVIEW OF SYSTEMS:  The patient has had no constitutional symptoms.  Her  last eye exam was June of 2008 and she has no problems.  No ENT,  cardiovascular, respiratory, GI, GU, or neurologic complaints.   EXAMINATION:  Temperature 97.4, blood pressure 152/85, pulse 71, weight  127.  GENERAL APPEARANCE:  This is a well-developed, well-nourished Caucasian  woman in no acute distress.  HEENT:  Normocephalic, atraumatic.  EACs and TMs showed some cerumen,  but TMs are normal otherwise.  Oropharynx with native dentition.  No  buccal or palatal lesions were noted.  Posterior pharynx was clear.  Conjunctivae, sclerae clear.  PERRLA.  EOMI.  Funduscopic exam deferred  to recent ophthalm exam.  NECK:  Supple without thyromegaly.  NODES:  No adenopathy  was noted in the cervical or supraclavicular  regions.  CHEST:  Without deformity.  LUNGS:  Clear with no rales, wheezes, or rhonchi.  BREASTS:  Deferred to gynecology.  CARDIOVASCULAR:  2+ radial pulse.  No JVD or carotid bruit.  She had a  quiet precordium with regular rate and rhythm without murmurs, rubs, or  gallops.  ABDOMEN:  Soft.  No guarding or rebound.  No organo-splenomegaly was  noted.  PELVIC AND RECTAL:  Deferred to gynecology.  EXTREMITIES:  Without cyanosis, clubbing, or edema.  No deformity was  noted, although some early signs of osteoarthritis at the first MCP  joints and the second MCP joints.  SKIN:  Clear.  NEUROLOGIC:  Nonfocal.   No laboratory ordered at this time, and we will defer for 3 weeks.   ASSESSMENT AND PLAN:  1. Hypertension.  The patient's blood pressure is mildly elevated at      today's visit.  At the last visit, she was 148/76.  Plan:  The      patient will continue her present medications of Lotrel and      hydrochlorothiazide.   Will have her monitor her blood pressures at      home and notify me if her systolics are consistently greater than      140.  2. Hyperlipidemia.  The patient's last lipid panel showed a      cholesterol of 199, triglycerides 120, HDL 63, and LDL of 112 on      low-dose Lipitor.  She felt it might be contributing to her back      pain and hand pain, so she discontinued medication.  At this point,      I have advised her to resume medication.  We will recheck her      lipids in 3 weeks.  3. Pulmonary.  The patient is stable with no abnormalities on      examination.  4. Health maintenance.  The patient discontinued Fosamax.  She would      be due for a followup DEXA scan.  The patient is current with her      gynecologist for pelvic and Pap smears.   SUMMARY:  This is a pleasant woman with minor discomfort in her back and  hips.  She does get relief with Aleve and she is reassured that this is  adequate medication at this time.  She is to return to see me on a  p.r.n. basis.     Whitney Gess Norins, MD  Electronically Signed    MEN/MedQ  DD: 09/16/2006  DT: 09/16/2006  Job #: [id number]   cc:   [name]

## 2010-07-06 NOTE — Op Note (Signed)
[place]  Patient:    Whitney Benson, Whitney Benson                        MRN: [id number] Proc. Date: 09/23/00 Adm. Date:  [id number] Attending:  Eloise Levels                           Operative Report  PREOPERATIVE DIAGNOSIS:  Basal cell carcinoma, right posterior shoulder.  POSTOPERATIVE DIAGNOSIS:  Basal cell carcinoma, right posterior shoulder.  PROCEDURES: 1. Excision of 1.3 cm basal cell carcinoma, posterior right shoulder, with    intraoperative frozen section diagnosis. 2. Complex closure of 2.8 cm incision, right posterior shoulder.  SURGEON:  Mary A. Contogiannis, M.D.  ANESTHESIA:  1% lidocaine with epinephrine.  COMPLICATIONS:  None.  INDICATIONS FOR PROCEDURE:  The patient is a 76 year old Caucasian female who has a biopsy-proven basal cell carcinoma of the right posterior shoulder.  The lesion has been there for about six months and has not been healing over.  She presents at this point to undergo excision of the lesion with an intraoperative frozen section diagnosis.  DESCRIPTION OF PROCEDURE:  The patient was brought into the minor room and placed on the OR table in the prone position.  The right shoulder and posterior back were then prepped with Betadine and draped in sterile fashion. The skin and subcutaneous tissues in the area of the basal cell cancer were then injected with 1% lidocaine with epinephrine.  After adequate hemostasis and anesthesia had taken effect, the procedure was begun.  Using loupe magnification, the basal cell cancer was identified and a 2 mm margin of apparent clear skin was taken in a circumferential fashion around the cancer. This was done in elliptical fashion full-thickness through the skin and subcutaneous fatty tissues.  The specimen was then marked at the 12 oclock position and passed off the table to undergo intraoperative frozen section diagnosis.  After consultation with the pathologist, he  reported that there was a basal cell cancer in the specimen and that all the margins were clear. At this point the skin edges were then undermined for easy closure.  The wound was closed in complex fashion after meticulous hemostasis was obtained.  The deeper subcutaneous tissues were closed with 3-0 Monocryl sutures.  The skin was then closed using 3-0 Monocryl interrupted dermal sutures, followed by 4-0 Monocryl running intracuticular stitch on the skin.  The incision was then dressed with benzoin and Steri-Strips.  There were no complications.  The patient tolerated the procedure well.  She was then discharged home in stable condition in the care of her husband.  She was taught proper wound care prior to discharge.  A follow-up appointment will be tomorrow in the office. DD:  09/23/00 TD:  09/24/00 Job: [id number]

## 2010-07-27 ENCOUNTER — Telehealth: Payer: Self-pay | Admitting: *Deleted

## 2010-07-27 MED ORDER — DIAZEPAM 5 MG PO TABS: 5.0000 mg | ORAL_TABLET | Freq: Four times a day (QID) | ORAL | Status: AC | PRN

## 2010-07-27 NOTE — Telephone Encounter (Signed)
Ok for refill x 5 

## 2010-07-27 NOTE — Telephone Encounter (Signed)
Fax from [facility] for Diazepam 5mg  SIG take one tablet by mouth q 6 hours prn last fill was 05/24/2010. Please Advise

## 2010-07-27 NOTE — Telephone Encounter (Signed)
Called in RX 

## 2010-10-29 ENCOUNTER — Other Ambulatory Visit: Payer: Self-pay | Admitting: Internal Medicine

## 2010-11-09 ENCOUNTER — Encounter: Payer: Self-pay | Admitting: Internal Medicine

## 2010-11-12 ENCOUNTER — Ambulatory Visit (INDEPENDENT_AMBULATORY_CARE_PROVIDER_SITE_OTHER): Payer: Medicare Other | Admitting: Internal Medicine

## 2010-11-12 ENCOUNTER — Other Ambulatory Visit (INDEPENDENT_AMBULATORY_CARE_PROVIDER_SITE_OTHER): Payer: Medicare Other

## 2010-11-12 VITALS — BP 138/76 | HR 63 | Temp 98.0°F | Wt 126.0 lb

## 2010-11-12 DIAGNOSIS — I10 Essential (primary) hypertension: Secondary | ICD-10-CM

## 2010-11-12 DIAGNOSIS — M545 Low back pain: Secondary | ICD-10-CM

## 2010-11-12 DIAGNOSIS — Z0000 Encounter for general adult medical examination without abnormal findings: Secondary | ICD-10-CM

## 2010-11-12 DIAGNOSIS — E785 Hyperlipidemia, unspecified: Secondary | ICD-10-CM

## 2010-11-12 DIAGNOSIS — Z136 Encounter for screening for cardiovascular disorders: Secondary | ICD-10-CM

## 2010-11-12 LAB — COMPREHENSIVE METABOLIC PANEL
ALT: 18 U/L (ref 0–35)
Albumin: 4.2 g/dL (ref 3.5–5.2)
Alkaline Phosphatase: 48 U/L (ref 39–117)
Calcium: 9.1 mg/dL (ref 8.4–10.5)
Creatinine, Ser: 0.8 mg/dL (ref 0.4–1.2)
GFR: 73.38 mL/min (ref >60.00)
Sodium: 140 mEq/L (ref 135–145)
Total Protein: 7.1 g/dL (ref 6.0–8.3)

## 2010-11-12 LAB — HEPATIC FUNCTION PANEL
ALT: 18 U/L (ref 0–35)
AST: 22 U/L (ref 0–37)
Albumin: 4.2 g/dL (ref 3.5–5.2)
Alkaline Phosphatase: 48 U/L (ref 39–117)
Bilirubin, Direct: 0.1 mg/dL (ref 0.0–0.3)
Total Bilirubin: 0.8 mg/dL (ref 0.3–1.2)

## 2010-11-12 LAB — LIPID PANEL
Total CHOL/HDL Ratio: 3
Triglycerides: 118 mg/dL (ref 0.0–149.0)
VLDL: 23.6 mg/dL (ref 0.0–40.0)

## 2010-11-12 NOTE — Progress Notes (Signed)
Subjective:    Patient ID: Whitney Benson, female    DOB: 1934/07/16, 76 y.o.   MRN: [id number]  HPI   The patient is here for annual Medicare wellness examination and management of other chronic and acute problems. sdhe cpontinues to have chronic back pain but only takes meloxicam and valium on an as needed basis. She is not doing any back exercises   The risk factors are reflected in the social history.  The roster of all physicians providing medical care to patient - is listed in the Snapshot section of the chart.  Activities of daily living:  The patient is 100% inedpendent in all ADLs: dressing, toileting, feeding as well as independent mobility  Home safety : The patient has smoke detectors in the home. They wear seatbelts.No firearms at home ( firearms are present in the home, kept in a safe fashion). There is no violence in the home.   There is no risks for hepatitis, STDs or HIV. There is no   history of blood transfusion. They have no travel history to infectious disease endemic areas of the world.  The patient has not seen their dentist in the last six month. They have not seen their eye doctor in the last year. They admit to any hearing difficulty and have not had audiologic testing in the last year.  They do not  have excessive sun exposure. Discussed the need for sun protection: hats, long sleeves and use of sunscreen if there is significant sun exposure.   Diet: the importance of a healthy diet is discussed. They do have a healthy (unhealthy-high fat/fast food) diet.  The patient has a regular exercise program: walking, house work.  The benefits of regular aerobic exercise were discussed.  Depression screen: there are no signs or vegative symptoms of depression- irritability, change in appetite, anhedonia, sadness/tearfullness.  Cognitive assessment: the patient manages all their financial and personal affairs and is actively engaged. They could relate day,date,year and  events; recalled 3/3 objects at 3 minutes; performed clock-face test normally.  The following portions of the patient's history were reviewed and updated as appropriate: allergies, current medications, past family history, past medical history,  past surgical history, past social history  and problem list.  Vision, hearing, body mass index were assessed and reviewed.   During the course of the visit the patient was educated and counseled about appropriate screening and preventive services including : fall prevention , diabetes screening, nutrition counseling, colorectal cancer screening, and recommended immunizations.   Review of Systems Review of Systems  Constitutional:  Negative for fever, chills, activity change and unexpected weight change.  HEENT:  Negative for hearing loss, ear pain, congestion, neck stiffness and postnasal drip. Negative for sore throat or swallowing problems. Negative for dental complaints.   Eyes: Negative for vision loss or change in visual acuity.  Respiratory: Negative for chest tightness and wheezing.   Cardiovascular: Negative for chest pain and palpitation. No decreased exercise tolerance Gastrointestinal: No change in bowel habit. No bloating or gas. No reflux or indigestion Genitourinary: Negative for urgency, frequency, flank pain and difficulty urinating.  Musculoskeletal: Negative for myalgias, back pain, arthralgias and gait problem.  Neurological: Negative for dizziness, tremors, weakness and headaches.  Hematological: Negative for adenopathy.  Psychiatric/Behavioral: Negative for behavioral problems and dysphoric mood.       Objective:   Physical Exam Vitals reviewed. Gen'l: well nourished, well developed white woman in no distress HEENT - [place]/AT, EACs/TMs normal, oropharynx with native dentition in  good condition, no buccal or palatal lesions, posterior pharynx clear, mucous membranes moist. C&S clear, PERRLA, fundi - normal Neck - supple, no  thyromegaly Nodes- negative submental, cervical, supraclavicular regions Chest - no deformity, no CVAT Lungs - cleat without rales, wheezes. No increased work of breathing Breast - deferred to gyn  Cardiovascular - regular rate and rhythm, quiet precordium, no murmurs, rubs or gallops, 2+ radial, DP and PT pulses Abdomen - BS+ x 4, no HSM, no guarding or rebound or tenderness Pelvic - deferred to gyn Rectal - deferred to gyn Extremities - no clubbing, cyanosis, edema or deformity.  Neuro - A&O x 3, CN II-XII normal, motor strength normal and equal, DTRs 2+ and symmetrical biceps, radial, and patellar tendons. Cerebellar - no tremor, no rigidity, fluid movement and normal gait. Derm - Head, neck, back, abdomen and extremities without suspicious lesions  Lab Results  Component Value Date   WBC 6.1 10/25/2009   HGB 13.4 10/25/2009   HCT 38.4 10/25/2009   PLT 263.0 10/25/2009   CHOL 169 11/12/2010   TRIG 118.0 11/12/2010   HDL 65.90 11/12/2010   LDLDIRECT 115.8 10/25/2009   ALT 18 11/12/2010   ALT 18 11/12/2010   AST 22 11/12/2010   AST 22 11/12/2010   NA 140 11/12/2010   K 3.8 11/12/2010   CL 103 11/12/2010   CREATININE 0.8 11/12/2010   BUN 17 11/12/2010   CO2 28 11/12/2010   TSH 0.86 10/25/2009        Glucose                 93         Assessment & Plan:

## 2010-11-13 DIAGNOSIS — Z0000 Encounter for general adult medical examination without abnormal findings: Secondary | ICD-10-CM | POA: Insufficient documentation

## 2010-11-13 NOTE — Assessment & Plan Note (Signed)
Chronic low back pain that will wax and wane. For periods of increased pain, usually at night when supine, she will take meloxicam and diazepam.  Plan - provided with back exercise pamphlet for daily exercise           Continue with prn medications.

## 2010-11-13 NOTE — Assessment & Plan Note (Signed)
Excellent control with LDL below goal of 130. Tolerating medication w/o adverse effects  Plan - continue present medication

## 2010-11-13 NOTE — Assessment & Plan Note (Signed)
Interval history benign. Physical exam, sans pelvic and breast, is normal. Lab results are in normal limits. She is due for mammography with report requested. She is current w/ colorectal cancer screening. 12 Lead EKG is normal w/o evidence of ischemia or injury. Immunizations current.  In summary- a very nice woman who is medically stable and doing well. She is encouraged to do back exercises daily and to continue healthy diet. She will return as needed or in 1 year.

## 2010-11-13 NOTE — Assessment & Plan Note (Signed)
BP Readings from Last 3 Encounters:  11/12/10 138/76  10/25/09 128/80  10/03/08 132/76   Pattern of good control. Normal lab results.  Plan - continue present treatment

## 2011-03-19 ENCOUNTER — Other Ambulatory Visit: Payer: Self-pay | Admitting: Internal Medicine

## 2011-03-19 NOTE — Telephone Encounter (Signed)
Done

## 2011-04-15 ENCOUNTER — Other Ambulatory Visit: Payer: Self-pay | Admitting: Internal Medicine

## 2011-04-16 NOTE — Telephone Encounter (Signed)
Denied: Pt's current EMR shows simvastatin; no note in chart to state otherwise.

## 2011-04-17 ENCOUNTER — Other Ambulatory Visit: Payer: Self-pay | Admitting: Internal Medicine

## 2011-04-18 NOTE — Telephone Encounter (Signed)
Denied-EMR chart shows Pt prescribed Simvastatin; not atorvastatin/SLS

## 2011-04-25 ENCOUNTER — Other Ambulatory Visit: Payer: Self-pay | Admitting: Internal Medicine

## 2011-05-24 ENCOUNTER — Other Ambulatory Visit: Payer: Self-pay | Admitting: Internal Medicine

## 2011-09-19 HISTORY — PX: OTHER SURGICAL HISTORY: SHX169

## 2011-09-26 ENCOUNTER — Other Ambulatory Visit: Payer: Self-pay | Admitting: Internal Medicine

## 2011-10-29 ENCOUNTER — Ambulatory Visit: Payer: Medicare Other | Admitting: General Practice

## 2011-10-29 DIAGNOSIS — Z23 Encounter for immunization: Secondary | ICD-10-CM

## 2011-10-29 MED ORDER — INFLUENZA VIRUS VACC SPLIT PF IM SUSP: 0.5000 mL | Freq: Once | INTRAMUSCULAR | Status: DC

## 2011-12-09 ENCOUNTER — Other Ambulatory Visit: Payer: Self-pay | Admitting: Internal Medicine

## 2011-12-25 ENCOUNTER — Other Ambulatory Visit: Payer: Self-pay | Admitting: Internal Medicine

## 2012-02-29 ENCOUNTER — Other Ambulatory Visit: Payer: Self-pay | Admitting: Internal Medicine

## 2012-03-02 ENCOUNTER — Other Ambulatory Visit: Payer: Self-pay | Admitting: *Deleted

## 2012-03-02 MED ORDER — DIAZEPAM 5 MG PO TABS: 5.0000 mg | ORAL_TABLET | Freq: Four times a day (QID) | ORAL | Status: DC | PRN

## 2012-03-02 MED ORDER — MELOXICAM 15 MG PO TABS: 15.0000 mg | ORAL_TABLET | Freq: Every day | ORAL | Status: DC

## 2012-03-16 ENCOUNTER — Other Ambulatory Visit: Payer: Self-pay | Admitting: Internal Medicine

## 2012-05-13 ENCOUNTER — Other Ambulatory Visit: Payer: Self-pay | Admitting: Internal Medicine

## 2012-05-14 ENCOUNTER — Telehealth: Payer: Self-pay | Admitting: Internal Medicine

## 2012-05-14 NOTE — Telephone Encounter (Signed)
Pt req refill for generic for Lipitor 20mg . Pt is out of this med, pt has an appt for CPE in 07/20/12. Please advise.

## 2012-05-15 MED ORDER — ATORVASTATIN CALCIUM 20 MG PO TABS: 20.0000 mg | ORAL_TABLET | Freq: Every day | ORAL | Status: DC

## 2012-05-15 NOTE — Telephone Encounter (Signed)
rx routed to pt's pharmacy.

## 2012-07-20 ENCOUNTER — Encounter: Payer: Self-pay | Admitting: Internal Medicine

## 2012-07-20 ENCOUNTER — Ambulatory Visit (INDEPENDENT_AMBULATORY_CARE_PROVIDER_SITE_OTHER): Payer: Medicare Other | Admitting: Internal Medicine

## 2012-07-20 ENCOUNTER — Other Ambulatory Visit (INDEPENDENT_AMBULATORY_CARE_PROVIDER_SITE_OTHER): Payer: Medicare Other

## 2012-07-20 VITALS — BP 150/72 | HR 69 | Temp 97.5°F | Wt 123.4 lb

## 2012-07-20 DIAGNOSIS — M1990 Unspecified osteoarthritis, unspecified site: Secondary | ICD-10-CM

## 2012-07-20 DIAGNOSIS — M79641 Pain in right hand: Secondary | ICD-10-CM

## 2012-07-20 DIAGNOSIS — M79642 Pain in left hand: Secondary | ICD-10-CM

## 2012-07-20 DIAGNOSIS — E785 Hyperlipidemia, unspecified: Secondary | ICD-10-CM

## 2012-07-20 DIAGNOSIS — M79609 Pain in unspecified limb: Secondary | ICD-10-CM

## 2012-07-20 DIAGNOSIS — I10 Essential (primary) hypertension: Secondary | ICD-10-CM

## 2012-07-20 DIAGNOSIS — Z0000 Encounter for general adult medical examination without abnormal findings: Secondary | ICD-10-CM

## 2012-07-20 DIAGNOSIS — M545 Low back pain: Secondary | ICD-10-CM

## 2012-07-20 LAB — LIPID PANEL: Cholesterol: 175 mg/dL (ref 0–200)

## 2012-07-20 LAB — COMPREHENSIVE METABOLIC PANEL
ALT: 21 U/L (ref 0–35)
AST: 22 U/L (ref 0–37)
Alkaline Phosphatase: 50 U/L (ref 39–117)
BUN: 18 mg/dL (ref 6–23)
Creatinine, Ser: 0.8 mg/dL (ref 0.4–1.2)
Potassium: 3.7 mEq/L (ref 3.5–5.1)

## 2012-07-20 LAB — SEDIMENTATION RATE: Sed Rate: 13 mm/hr (ref 0–22)

## 2012-07-20 LAB — TSH: TSH: 0.76 u[IU]/mL (ref 0.35–5.50)

## 2012-07-20 MED ORDER — MELOXICAM 15 MG PO TABS: 15.0000 mg | ORAL_TABLET | Freq: Every day | ORAL | Status: DC

## 2012-07-20 NOTE — Patient Instructions (Addendum)
Thanks for coming in to see me.  Hand pain - by history and exam this is osteoarthrits. Plan Use the hands  Heat always helps, reduce morning stiffness by soaking hands in warm water  Consider getting special gloves to warm your hands - on online to google: arthritis,hands and gloves  Cholesterol - will check lab today with recommendations to follow.  Blood pressure:  BP Readings from Last 3 Encounters:  07/20/12 150/72  11/12/10 138/76  10/25/09 128/80   Generally good control on your present medications.  Health maintenance - up to date.  You need to sign up to MyChart

## 2012-07-20 NOTE — Assessment & Plan Note (Signed)
Interval history significant for progressive arthritis of the hands, otherwise normal. Limited physical exam, sans breast and pelvic, is normal. She is current with colorectal cancer screening. Despite her age and USPHCTF recommendations I have advised a mammogram every 2-3 years. She is current with immunizations but is due shingles vaccine.   In summary - a nice, very independent woman who appears to be medically stable. She will return in 1 year or sooner as needed.

## 2012-07-20 NOTE — Progress Notes (Signed)
Subjective:    Patient ID: Whitney Benson, female    DOB: [birth year], 78 y.o.   MRN: [id number]  HPI Mrs. Whitney Benson is here for annual Medicare wellness examination and management of other chronic and acute problems.  She has seen her gynecologist and had a normal exam, normal mammogram and normal bone density.  She complains of joint pains hands with a morning gel of 20 min. The pain can be severe and she feels her hands may lock up. She has not had any joint deformity.     The risk factors are reflected in the social history.  The roster of all physicians providing medical care to patient - is listed in the Snapshot section of the chart.  Activities of daily living:  The patient is 100% inedpendent in all ADLs: dressing, toileting, feeding as well as independent mobility  Home safety : The patient has smoke detectors in the home. They wear seatbelts. No firearms at home. There is no violence in the home.   There is no risks for hepatitis, STDs or HIV. There is no history of blood transfusion. They have no travel history to infectious disease endemic areas of the world.  The patient has seen their dentist in the last six month. They have seen their eye doctor in the last year. They admit to hearing difficulty and have not had audiologic testing in the last year.  They do not  have excessive sun exposure. Discussed the need for sun protection: hats, long sleeves and use of sunscreen if there is significant sun exposure.   Diet: the importance of a healthy diet is discussed. They do have a healthy diet.  The patient has no regular exercise program.  The benefits of regular aerobic exercise were discussed.  Depression screen: there are no signs or vegative symptoms of depression- irritability, change in appetite, anhedonia, sadness/tearfullness.  Cognitive assessment: the patient manages all their financial and personal affairs and is actively engaged.   The following portions of the  patient's history were reviewed and updated as appropriate: allergies, current medications, past family history, past medical history,  past surgical history, past social history  and problem list.  Vision, hearing, body mass index were assessed and reviewed.   During the course of the visit the patient was educated and counseled about appropriate screening and preventive services including : fall prevention , diabetes screening, nutrition counseling, colorectal cancer screening, and recommended immunizations.  Past Medical History  Diagnosis Date  . Insomnia   . History of colon polyps   . DJD (degenerative joint disease)   . Hyperlipidemia   . History of asthma   . Hypertension    Past Surgical History  Procedure Laterality Date  . Polypectomy    . Dilation and curettage of uterus    . Tubal ligation    . Other surgical history  09/2011    mammogram and bone density   Family History  Problem Relation Age of Onset  . Cancer Mother   . Hypertension Mother   . Cancer Father    History   Social History  . Marital Status: Married    Spouse Name: N/A    Number of Children: N/A  . Years of Education: N/A   Occupational History  . Not on file.   Social History Main Topics  . Smoking status: Former Games developer  . Smokeless tobacco: Not on file  . Alcohol Use: Yes     Comment: wine  . Drug Use: No  .  Sexually Active: Not on file   Other Topics Concern  . 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 amLODipine (NORVASC) 10 MG tablet Take 10 mg by mouth daily.        Marland Kitchen aspirin 81 MG tablet Take 81 mg by mouth daily.        Marland Kitchen atorvastatin (LIPITOR) 20 MG tablet Take 1 tablet (20 mg total) by mouth daily.  30 tablet  2  . benazepril (LOTENSIN) 20 MG tablet TAKE 1  TABLET BY MOUTH DAILY  90 tablet  0  . calcium carbonate (OS-CAL) 600 MG TABS Take 600 mg by mouth 2 (two) times daily with a meal.        . diazepam (VALIUM) 5 MG tablet Take 1 tablet (5 mg total) by mouth every 6 (six) hours as needed for anxiety.  30 tablet  1  . glucosamine-chondroitin 500-400 MG tablet Take 1 tablet by mouth 2 (two) times daily.       . hydrochlorothiazide (HYDRODIURIL) 25 MG tablet TAKE 1 TABLET BY MOUTH ONCE DAILY  90 tablet  1  . MULTIPLE VITAMIN PO Take by mouth.         No current facility-administered medications on file prior to visit.     Review of Systems Constitutional:  Negative for fever, chills, activity change and unexpected weight change.  HEENT:  Negative for hearing loss, ear pain, congestion, neck stiffness and postnasal drip. Negative for sore throat or swallowing problems. Negative for dental complaints.   Eyes: Negative for vision loss or change in visual acuity.  Respiratory: Negative for chest tightness and wheezing. Negative for DOE.   Cardiovascular: Negative for chest pain or palpitations. No decreased exercise tolerance Gastrointestinal: No change in bowel habit. No bloating or gas. No reflux or indigestion Genitourinary: Negative for urgency, frequency, flank pain and difficulty urinating.  Musculoskeletal: Negative for myalgias, back pain and gait problem. positive with arthralgia Neurological: Negative for dizziness, tremors, weakness and headaches.  Hematological: Negative for adenopathy.  Psychiatric/Behavioral: Negative for behavioral problems and dysphoric mood.       Objective:   Physical Exam Filed Vitals:   07/20/12 1334  BP: 150/72  Pulse: 69  Temp: 97.5 F (36.4 C)   Wt Readings from Last 3 Encounters:  07/20/12 123 lb 6.4 oz (55.974 kg)  11/12/10 126 lb (57.153 kg)  10/25/09 127 lb (57.607 kg)   Gen'l: well nourished, well developed white Woman in no distress HEENT - [place]/AT, EACs/TMs normal, oropharynx with native  dentition in good condition, no buccal or palatal lesions, posterior pharynx clear, mucous membranes moist. C&S clear, PERRLA, fundi - normal Neck - supple, no thyromegaly Nodes- negative submental, cervical, supraclavicular regions Chest - no deformity, no CVAT Lungs - clear without rales, wheezes. No increased work of breathing Breast - deferred Cardiovascular - regular rate and rhythm, quiet precordium, no murmurs, rubs or gallops, 2+ radial, DP and PT pulses Abdomen - BS+ x 4, no HSM, no guarding or rebound or tenderness Pelvic - deferred to gyn Rectal - deferred to gyn Extremities - no clubbing, cyanosis, edema or deformity.  Neuro - A&O x 3, CN II-XII normal, motor strength normal and equal, DTRs 2+ and symmetrical biceps, radial, and patellar tendons.  Cerebellar - no tremor, no rigidity, fluid movement and normal gait. Derm - Head, neck, back, abdomen and extremities without suspicious lesions  Recent Results (from the past 2160 hour(s))  LIPID PANEL     Status: Abnormal   Collection Time    07/20/12  2:34 PM      Result Value Range   Cholesterol 175  0 - 200 mg/dL   Comment: ATP III Classification       Desirable:  < 200 mg/dL               Borderline High:  200 - 239 mg/dL          High:  > = 213 mg/dL   Triglycerides 086.5 (*) 0.0 - 149.0 mg/dL   Comment: Normal:  <784 mg/dLBorderline High:  150 - 199 mg/dL   HDL 69.62  >95.28 mg/dL   VLDL 41.3 (*) 0.0 - 24.4 mg/dL   Total CHOL/HDL Ratio 3     Comment:                Men          Women1/2 Average Risk     3.4          3.3Average Risk          5.0          4.42X Average Risk          9.6          7.13X Average Risk          15.0          11.0                      TSH     Status: None   Collection Time    07/20/12  2:34 PM      Result Value Range   TSH 0.76  0.35 - 5.50 uIU/mL  SEDIMENTATION RATE     Status: None   Collection Time    07/20/12  2:34 PM      Result Value Range   Sed Rate 13  0 - 22 mm/hr  COMPREHENSIVE  METABOLIC PANEL     Status: Abnormal   Collection Time    07/20/12  2:34 PM      Result Value Range   Sodium 137  135 - 145 mEq/L   Potassium 3.7  3.5 - 5.1 mEq/L   Chloride 100  96 - 112 mEq/L   CO2 28  19 - 32 mEq/L   Glucose, Bld 103 (*) 70 - 99 mg/dL   BUN 18  6 - 23 mg/dL   Creatinine, Ser 0.8  0.4 - 1.2 mg/dL   Total Bilirubin 0.5  0.3 - 1.2 mg/dL   Alkaline Phosphatase 50  39 - 117 U/L   AST 22  0 - 37 U/L   ALT 21  0 - 35 U/L   Total Protein 7.2  6.0 - 8.3 g/dL   Albumin 4.2  3.5 - 5.2 g/dL   Calcium 01.0  8.4 - 27.2 mg/dL   GFR 53.66  >44.03 mL/min  LDL CHOLESTEROL, DIRECT     Status: None   Collection Time    07/20/12  2:34 PM      Result Value Range   Direct LDL 84.1     Comment: Optimal:  <100 mg/dLNear or Above Optimal:  100-129 mg/dLBorderline High:  130-159 mg/dLHigh:  160-189 mg/dLVery High:  >190 mg/dL  Assessment & Plan:

## 2012-07-20 NOTE — Assessment & Plan Note (Signed)
Bilateral hand pain with early joint changes c/w OA.   Plan Continue glucosamine  Heat: warm soaks in the AM, consider OA devices, e.g. Warming gloves.  NSAID, e.g. Meloxicam, as needed for flares of pain.  Active use of the hands.

## 2012-07-20 NOTE — Assessment & Plan Note (Signed)
Chronbic mild back pain that will occasionally flair, currently not a problem.  Plan NSAIDs as needed  Valium as needed for muscle spasm.

## 2012-07-20 NOTE — Assessment & Plan Note (Signed)
Tolerating medication w/o adverse effects.  Lab reveals excellent control with normal liver functions.  Plan  Continue present treatment

## 2012-07-20 NOTE — Assessment & Plan Note (Signed)
BP Readings from Last 3 Encounters:  07/20/12 150/72  11/12/10 138/76  10/25/09 128/80   Generally good control on present regimen.  PLan Continue present medications.

## 2012-07-29 ENCOUNTER — Other Ambulatory Visit: Payer: Self-pay | Admitting: Internal Medicine

## 2012-08-25 ENCOUNTER — Other Ambulatory Visit: Payer: Self-pay | Admitting: Internal Medicine

## 2012-10-29 ENCOUNTER — Other Ambulatory Visit: Payer: Self-pay | Admitting: Internal Medicine

## 2012-10-29 ENCOUNTER — Ambulatory Visit (INDEPENDENT_AMBULATORY_CARE_PROVIDER_SITE_OTHER): Payer: Medicare Other

## 2012-10-29 ENCOUNTER — Other Ambulatory Visit: Payer: Self-pay

## 2012-10-29 DIAGNOSIS — Z23 Encounter for immunization: Secondary | ICD-10-CM

## 2012-10-29 MED ORDER — HYDROCHLOROTHIAZIDE 25 MG PO TABS: 25.0000 mg | ORAL_TABLET | Freq: Every day | ORAL | Status: DC

## 2013-03-15 ENCOUNTER — Telehealth: Payer: Self-pay

## 2013-03-15 NOTE — Telephone Encounter (Signed)
Ok to call in order.

## 2013-03-15 NOTE — Telephone Encounter (Signed)
Husband walked in requesting a rx for Zostavax be sent to [facility]

## 2013-03-16 MED ORDER — ZOSTER VACCINE LIVE 19400 UNT/0.65ML ~~LOC~~ SOLR
0.6500 mL | Freq: Once | SUBCUTANEOUS | Status: DC
Start: 2013-03-16 — End: 2013-07-23

## 2013-03-16 NOTE — Telephone Encounter (Signed)
done

## 2013-04-16 ENCOUNTER — Other Ambulatory Visit: Payer: Self-pay | Admitting: Internal Medicine

## 2013-05-08 ENCOUNTER — Other Ambulatory Visit: Payer: Self-pay | Admitting: Internal Medicine

## 2013-06-03 ENCOUNTER — Telehealth: Payer: Self-pay | Admitting: Internal Medicine

## 2013-06-03 NOTE — Telephone Encounter (Signed)
Pt request to transfer from Dr. Linda Hedges to Dr. Camila Li. Pt stated her spouse is a pt of Dr. Camila Li already that's why she would like to be with the same doctor. Pt was with doctor Norins for a long time and would like to stay at the same office. Please advise.

## 2013-06-03 NOTE — Telephone Encounter (Signed)
OK. Thx

## 2013-06-03 NOTE — Telephone Encounter (Signed)
Pt schedule for CPE 07/23/13. Thank you

## 2013-07-23 ENCOUNTER — Ambulatory Visit (INDEPENDENT_AMBULATORY_CARE_PROVIDER_SITE_OTHER): Payer: Medicare Other | Admitting: Internal Medicine

## 2013-07-23 ENCOUNTER — Other Ambulatory Visit (INDEPENDENT_AMBULATORY_CARE_PROVIDER_SITE_OTHER): Payer: Medicare Other

## 2013-07-23 ENCOUNTER — Encounter: Payer: Self-pay | Admitting: Internal Medicine

## 2013-07-23 VITALS — BP 140/60 | HR 68 | Temp 98.3°F | Ht 60.0 in | Wt 128.4 lb

## 2013-07-23 DIAGNOSIS — D126 Benign neoplasm of colon, unspecified: Secondary | ICD-10-CM

## 2013-07-23 DIAGNOSIS — F411 Generalized anxiety disorder: Secondary | ICD-10-CM

## 2013-07-23 DIAGNOSIS — E785 Hyperlipidemia, unspecified: Secondary | ICD-10-CM

## 2013-07-23 DIAGNOSIS — J45909 Unspecified asthma, uncomplicated: Secondary | ICD-10-CM

## 2013-07-23 DIAGNOSIS — I10 Essential (primary) hypertension: Secondary | ICD-10-CM

## 2013-07-23 DIAGNOSIS — Z0000 Encounter for general adult medical examination without abnormal findings: Secondary | ICD-10-CM

## 2013-07-23 DIAGNOSIS — G4700 Insomnia, unspecified: Secondary | ICD-10-CM

## 2013-07-23 LAB — LIPID PANEL
CHOLESTEROL: 179 mg/dL (ref 0–200)
HDL: 63.2 mg/dL (ref >39.00)
LDL Cholesterol: 91 mg/dL (ref 0–99)
NonHDL: 115.8
Total CHOL/HDL Ratio: 3
Triglycerides: 122 mg/dL (ref 0.0–149.0)
VLDL: 24.4 mg/dL (ref 0.0–40.0)

## 2013-07-23 LAB — URINALYSIS
Bilirubin Urine: NEGATIVE
KETONES UR: NEGATIVE
Leukocytes, UA: NEGATIVE
Nitrite: NEGATIVE
SPECIFIC GRAVITY, URINE: 1.01 (ref 1.000–1.030)
Total Protein, Urine: NEGATIVE
URINE GLUCOSE: NEGATIVE
UROBILINOGEN UA: 0.2 (ref 0.0–1.0)
pH: 7.5 (ref 5.0–8.0)

## 2013-07-23 LAB — CBC WITH DIFFERENTIAL/PLATELET
BASOS ABS: 0 10*3/uL (ref 0.0–0.1)
Basophils Relative: 0.4 % (ref 0.0–3.0)
EOS ABS: 0.2 10*3/uL (ref 0.0–0.7)
Eosinophils Relative: 2.9 % (ref 0.0–5.0)
HCT: 38.5 % (ref 36.0–46.0)
HEMOGLOBIN: 13.1 g/dL (ref 12.0–15.0)
Lymphocytes Relative: 29.5 % (ref 12.0–46.0)
Lymphs Abs: 1.8 10*3/uL (ref 0.7–4.0)
MCHC: 34 g/dL (ref 30.0–36.0)
MCV: 93.5 fl (ref 78.0–100.0)
MONOS PCT: 7.4 % (ref 3.0–12.0)
Monocytes Absolute: 0.5 10*3/uL (ref 0.1–1.0)
NEUTROS ABS: 3.7 10*3/uL (ref 1.4–7.7)
Neutrophils Relative %: 59.8 % (ref 43.0–77.0)
Platelets: 266 10*3/uL (ref 150.0–400.0)
RBC: 4.12 Mil/uL (ref 3.87–5.11)
RDW: 13 % (ref 11.5–15.5)
WBC: 6.1 10*3/uL (ref 4.0–10.5)

## 2013-07-23 LAB — HEPATIC FUNCTION PANEL
ALBUMIN: 4 g/dL (ref 3.5–5.2)
ALT: 25 U/L (ref 0–35)
AST: 26 U/L (ref 0–37)
Alkaline Phosphatase: 51 U/L (ref 39–117)
Bilirubin, Direct: 0.1 mg/dL (ref 0.0–0.3)
Total Bilirubin: 0.6 mg/dL (ref 0.2–1.2)
Total Protein: 7 g/dL (ref 6.0–8.3)

## 2013-07-23 LAB — BASIC METABOLIC PANEL
BUN: 18 mg/dL (ref 6–23)
CO2: 29 meq/L (ref 19–32)
Calcium: 9.6 mg/dL (ref 8.4–10.5)
Chloride: 101 mEq/L (ref 96–112)
Creatinine, Ser: 0.8 mg/dL (ref 0.4–1.2)
GFR: 76.09 mL/min (ref >60.00)
GLUCOSE: 95 mg/dL (ref 70–99)
POTASSIUM: 4 meq/L (ref 3.5–5.1)
Sodium: 138 mEq/L (ref 135–145)

## 2013-07-23 LAB — TSH: TSH: 1.05 u[IU]/mL (ref 0.35–4.50)

## 2013-07-23 MED ORDER — MELOXICAM 15 MG PO TABS: 15.0000 mg | ORAL_TABLET | Freq: Every day | ORAL | Status: DC

## 2013-07-23 MED ORDER — BENAZEPRIL HCL 20 MG PO TABS: ORAL_TABLET | ORAL | Status: DC

## 2013-07-23 MED ORDER — DIAZEPAM 5 MG PO TABS: 5.0000 mg | ORAL_TABLET | Freq: Four times a day (QID) | ORAL | Status: DC | PRN

## 2013-07-23 MED ORDER — VITAMIN D 1000 UNITS PO TABS: 1000.0000 [IU] | ORAL_TABLET | Freq: Every day | ORAL | Status: DC

## 2013-07-23 MED ORDER — VITAMIN D 1000 UNITS PO TABS: 1000.0000 [IU] | ORAL_TABLET | Freq: Every day | ORAL | Status: AC

## 2013-07-23 NOTE — Progress Notes (Signed)
Subjective:    Patient ID: Whitney Benson, female    DOB: Aug 24, 1934, 79 y.o.   MRN: [id number]  HPI  The patient is here for a wellness exam. The patient has been doing well overall without major physical or psychological issues going on lately.  Past Medical History   Diagnosis  Date   .  Insomnia    .  History of colon polyps    .  DJD (degenerative joint disease)    .  Hyperlipidemia    .  History of asthma    .  Hypertension     Past Surgical History   Procedure  Laterality  Date   .  Polypectomy     .  Dilation and curettage of uterus     .  Tubal ligation     .  Other surgical history   09/2011     mammogram and bone density    Family History   Problem  Relation  Age of Onset   .  Cancer  Mother    .  Hypertension  Mother    .  Cancer  Father     History    Social History   .  Marital Status:  Married     Spouse Name:  N/A     Number of Children:  N/A   .  Years of Education:  N/A    Occupational History   .  Not on file.    Social History Main Topics   .  Smoking status:  Former Research scientist (life sciences)   .  Smokeless tobacco:  Not on file   .  Alcohol Use:  Yes      Comment: wine   .  Drug Use:  No   .  Sexually Active:  Not on file    Other Topics  Concern   .  Not on file    Social History Narrative    HSG    Married '61    2 sons- '69, '78, 2 daughters- '63, '65, 4 grandchildren    Worked- Magazine features editor, Medical sales representative work, homemaker    Current Outpatient Prescriptions on File Prior to Visit   Medication  Sig  Dispense  Refill   .  amLODipine (NORVASC) 10 MG tablet  Take 10 mg by mouth daily.     Marland Kitchen  aspirin 81 MG tablet  Take 81 mg by mouth daily.     Marland Kitchen  atorvastatin (LIPITOR) 20 MG tablet  Take 1 tablet (20 mg total) by mouth daily.  30 tablet  2   .  benazepril (LOTENSIN) 20 MG tablet  TAKE 1 TABLET BY MOUTH DAILY  90 tablet  0   .  calcium carbonate (OS-CAL) 600 MG TABS  Take 600 mg by mouth 2 (two) times daily with a meal.     .  diazepam (VALIUM) 5 MG tablet   Take 1 tablet (5 mg total) by mouth every 6 (six) hours as needed for anxiety.  30 tablet  1   .  glucosamine-chondroitin 500-400 MG tablet  Take 1 tablet by mouth 2 (two) times daily.     .  hydrochlorothiazide (HYDRODIURIL) 25 MG tablet  TAKE 1 TABLET BY MOUTH ONCE DAILY  90 tablet  1   .  MULTIPLE VITAMIN PO  Take by mouth.      No current facility-administered medications on file prior to visit.       Review of Systems  Constitutional: Negative for chills, activity  change, appetite change, fatigue and unexpected weight change.  HENT: Negative for congestion, mouth sores and sinus pressure.   Eyes: Negative for visual disturbance.  Respiratory: Negative for cough and chest tightness.   Gastrointestinal: Negative for nausea and abdominal pain.  Genitourinary: Negative for frequency, difficulty urinating and vaginal pain.  Musculoskeletal: Positive for arthralgias. Negative for back pain and gait problem.  Skin: Negative for pallor and rash.  Neurological: Negative for dizziness, tremors, weakness, numbness and headaches.  Psychiatric/Behavioral: Negative for suicidal ideas, confusion and sleep disturbance. The patient is nervous/anxious.        Objective:   Physical Exam  Constitutional: She appears well-developed. No distress.  HENT:  Head: Normocephalic.  Right Ear: External ear normal.  Left Ear: External ear normal.  Nose: Nose normal.  Mouth/Throat: Oropharynx is clear and moist.  Eyes: Conjunctivae are normal. Pupils are equal, round, and reactive to light. Right eye exhibits no discharge. Left eye exhibits no discharge.  Neck: Normal range of motion. Neck supple. No JVD present. No tracheal deviation present. No thyromegaly present.  Cardiovascular: Normal rate, regular rhythm and normal heart sounds.   Pulmonary/Chest: No stridor. No respiratory distress. She has no wheezes.  Abdominal: Soft. Bowel sounds are normal. She exhibits no distension and no mass. There is no  tenderness. There is no rebound and no guarding.  Musculoskeletal: She exhibits no edema and no tenderness.  Lymphadenopathy:    She has no cervical adenopathy.  Neurological: She displays normal reflexes. No cranial nerve deficit. She exhibits normal muscle tone. Coordination normal.  Skin: No rash noted. No erythema.  Psychiatric: She has a normal mood and affect. Her behavior is normal. Judgment and thought content normal.          Assessment & Plan:

## 2013-07-23 NOTE — Assessment & Plan Note (Signed)
Continue with current prescription therapy as reflected on the Med list.  

## 2013-07-23 NOTE — Progress Notes (Signed)
Pre visit review using our clinic review tool, if applicable. No additional management support is needed unless otherwise documented below in the visit note. 

## 2013-07-23 NOTE — Assessment & Plan Note (Signed)
Here for medicare wellness/physical  Diet: heart healthy  Physical activity: not sedentary  Depression/mood screen: negative  Hearing: decreased to whispered voice  Visual acuity: grossly normal, performs annual eye exam  ADLs: capable  Fall risk: none  Home safety: good  Cognitive evaluation: intact to orientation, naming, recall and repetition  EOL planning: adv directives, full code/ I agree  I have personally reviewed and have noted  1. The patient's medical and social history  2. Their use of alcohol, tobacco or illicit drugs  3. Their current medications and supplements  4. The patient's functional ability including ADL's, fall risks, home safety risks and hearing or visual impairment.  5. Diet and physical activities  6. Evidence for depression or mood disorders    Today patient counseled on age appropriate routine health concerns for screening and prevention, each reviewed and up to date or declined. Immunizations reviewed and up to date or declined. Labs ordered and reviewed. Risk factors for depression reviewed and negative. Hearing function and visual acuity are intact. ADLs screened and addressed as needed. Functional ability and level of safety reviewed and appropriate. Education, counseling and referrals performed based on assessed risks today. Patient provided with a copy of personalized plan for preventive services.

## 2013-07-23 NOTE — Patient Instructions (Signed)
Hold Lipitor x 2-3 weeks to see if hand pain is better

## 2013-07-23 NOTE — Assessment & Plan Note (Signed)
Doing well 

## 2013-07-23 NOTE — Assessment & Plan Note (Signed)
Colonoscopy

## 2013-09-27 ENCOUNTER — Telehealth: Payer: Self-pay | Admitting: *Deleted

## 2013-09-27 NOTE — Telephone Encounter (Signed)
Received call from Legrand Como wanting to verify what BP med pt is suppose to be taking. Pt had drop off a sheet that had losartan/hctz & benazapril. Inform pharmacist per pt records she has been taking benazapril & at her cpx back in June md stated to continue taking benazpril & gave year refill. There is no notation on losartan. Micheal stated he will call pt back to see if she was needing refill on her husband because he takes losartan...lmb

## 2013-09-30 ENCOUNTER — Other Ambulatory Visit: Payer: Self-pay | Admitting: *Deleted

## 2013-09-30 MED ORDER — ATORVASTATIN CALCIUM 20 MG PO TABS: 20.0000 mg | ORAL_TABLET | Freq: Every day | ORAL | Status: DC

## 2013-11-04 ENCOUNTER — Ambulatory Visit (INDEPENDENT_AMBULATORY_CARE_PROVIDER_SITE_OTHER): Payer: Medicare Other

## 2013-11-04 DIAGNOSIS — Z23 Encounter for immunization: Secondary | ICD-10-CM

## 2013-11-29 ENCOUNTER — Other Ambulatory Visit: Payer: Self-pay | Admitting: Obstetrics and Gynecology

## 2013-11-30 LAB — CYTOLOGY - PAP

## 2013-12-16 ENCOUNTER — Other Ambulatory Visit (INDEPENDENT_AMBULATORY_CARE_PROVIDER_SITE_OTHER): Payer: Medicare Other

## 2013-12-16 ENCOUNTER — Ambulatory Visit (INDEPENDENT_AMBULATORY_CARE_PROVIDER_SITE_OTHER)
Admission: RE | Admit: 2013-12-16 | Discharge: 2013-12-16 | Disposition: A | Discharge status: Home / Self Care | Payer: Medicare Other | Source: Ambulatory Visit | Attending: Internal Medicine | Admitting: Internal Medicine

## 2013-12-16 ENCOUNTER — Ambulatory Visit (INDEPENDENT_AMBULATORY_CARE_PROVIDER_SITE_OTHER): Payer: Medicare Other | Admitting: Internal Medicine

## 2013-12-16 ENCOUNTER — Encounter: Payer: Self-pay | Admitting: Internal Medicine

## 2013-12-16 VITALS — BP 142/80 | HR 66 | Temp 97.3°F

## 2013-12-16 DIAGNOSIS — J189 Pneumonia, unspecified organism: Secondary | ICD-10-CM

## 2013-12-16 LAB — BASIC METABOLIC PANEL
BUN: 10 mg/dL (ref 6–23)
CALCIUM: 9.4 mg/dL (ref 8.4–10.5)
CO2: 24 mEq/L (ref 19–32)
CREATININE: 0.7 mg/dL (ref 0.4–1.2)
Chloride: 98 mEq/L (ref 96–112)
GFR: 92.17 mL/min (ref >60.00)
Glucose, Bld: 109 mg/dL — ABNORMAL HIGH (ref 70–99)
Potassium: 3.6 mEq/L (ref 3.5–5.1)
SODIUM: 132 meq/L — AB (ref 135–145)

## 2013-12-16 LAB — CBC WITH DIFFERENTIAL/PLATELET
BASOS ABS: 0.1 10*3/uL (ref 0.0–0.1)
BASOS PCT: 0.6 % (ref 0.0–3.0)
EOS PCT: 3.4 % (ref 0.0–5.0)
Eosinophils Absolute: 0.3 10*3/uL (ref 0.0–0.7)
HCT: 39 % (ref 36.0–46.0)
Hemoglobin: 13.2 g/dL (ref 12.0–15.0)
LYMPHS ABS: 1.4 10*3/uL (ref 0.7–4.0)
LYMPHS PCT: 16.3 % (ref 12.0–46.0)
MCHC: 33.9 g/dL (ref 30.0–36.0)
MCV: 92.6 fl (ref 78.0–100.0)
Monocytes Absolute: 0.6 10*3/uL (ref 0.1–1.0)
Monocytes Relative: 6.7 % (ref 3.0–12.0)
Neutro Abs: 6.1 10*3/uL (ref 1.4–7.7)
Neutrophils Relative %: 73 % (ref 43.0–77.0)
Platelets: 259 10*3/uL (ref 150.0–400.0)
RBC: 4.22 Mil/uL (ref 3.87–5.11)
RDW: 11.8 % (ref 11.5–15.5)
WBC: 8.4 10*3/uL (ref 4.0–10.5)

## 2013-12-16 LAB — LIPASE: Lipase: 31 U/L (ref 11.0–59.0)

## 2013-12-16 MED ORDER — ONDANSETRON HCL 4 MG/2ML IJ SOLN
4.0000 mg | Freq: Once | INTRAMUSCULAR | Status: AC
  Administered 2013-12-16: 4 mg via INTRAMUSCULAR

## 2013-12-16 MED ORDER — CEFTRIAXONE SODIUM 1 G IJ SOLR
1.0000 g | Freq: Once | INTRAMUSCULAR | Status: AC
  Administered 2013-12-16: 1 g via INTRAMUSCULAR

## 2013-12-16 MED ORDER — PROMETHAZINE-CODEINE 6.25-10 MG/5ML PO SYRP: 5.0000 mL | ORAL_SOLUTION | ORAL | Status: DC | PRN

## 2013-12-16 MED ORDER — LEVOFLOXACIN 500 MG PO TABS: 500.0000 mg | ORAL_TABLET | Freq: Every day | ORAL | Status: DC

## 2013-12-16 NOTE — Assessment & Plan Note (Signed)
10/15 L Rocephin 1 g IM

## 2013-12-16 NOTE — Progress Notes (Signed)
Pre visit review using our clinic review tool, if applicable. No additional management support is needed unless otherwise documented below in the visit note. 

## 2013-12-19 ENCOUNTER — Encounter: Payer: Self-pay | Admitting: Internal Medicine

## 2013-12-19 NOTE — Progress Notes (Signed)
Patient ID: Whitney Benson, female   DOB: 1935-02-14, 79 y.o.   MRN: [id number]   Subjective:    HPI   C/o cough, fever, nausea and weakness x 1 week - worse...  Past Medical History   Diagnosis  Date   .  Insomnia    .  History of colon polyps    .  DJD (degenerative joint disease)    .  Hyperlipidemia    .  History of asthma    .  Hypertension     Past Surgical History   Procedure  Laterality  Date   .  Polypectomy     .  Dilation and curettage of uterus     .  Tubal ligation     .  Other surgical history   09/2011     mammogram and bone density    Family History   Problem  Relation  Age of Onset   .  Cancer  Mother    .  Hypertension  Mother    .  Cancer  Father     History    Social History   .  Marital Status:  Married     Spouse Name:  N/A     Number of Children:  N/A   .  Years of Education:  N/A    Occupational History   .  Not on file.    Social History Main Topics   .  Smoking status:  Former Research scientist (life sciences)   .  Smokeless tobacco:  Not on file   .  Alcohol Use:  Yes      Comment: wine   .  Drug Use:  No   .  Sexually Active:  Not on file    Other Topics  Concern   .  Not on file    Social History Narrative    HSG    Married '61    2 sons- '69, '78, 2 daughters- '63, '65, 4 grandchildren    Worked- Magazine features editor, Medical sales representative work, homemaker    Current Outpatient Prescriptions on File Prior to Visit   Medication  Sig  Dispense  Refill   .  amLODipine (NORVASC) 10 MG tablet  Take 10 mg by mouth daily.     Marland Kitchen  aspirin 81 MG tablet  Take 81 mg by mouth daily.     Marland Kitchen  atorvastatin (LIPITOR) 20 MG tablet  Take 1 tablet (20 mg total) by mouth daily.  30 tablet  2   .  benazepril (LOTENSIN) 20 MG tablet  TAKE 1 TABLET BY MOUTH DAILY  90 tablet  0   .  calcium carbonate (OS-CAL) 600 MG TABS  Take 600 mg by mouth 2 (two) times daily with a meal.     .  diazepam (VALIUM) 5 MG tablet  Take 1 tablet (5 mg total) by mouth every 6 (six) hours as needed for anxiety.  30 tablet   1   .  glucosamine-chondroitin 500-400 MG tablet  Take 1 tablet by mouth 2 (two) times daily.     .  hydrochlorothiazide (HYDRODIURIL) 25 MG tablet  TAKE 1 TABLET BY MOUTH ONCE DAILY  90 tablet  1   .  MULTIPLE VITAMIN PO  Take by mouth.      No current facility-administered medications on file prior to visit.       Review of Systems  Constitutional: Positive for fever, chills, diaphoresis and fatigue. Negative for activity change, appetite change and unexpected weight change.  HENT: Negative for congestion, mouth sores and sinus pressure.   Eyes: Negative for visual disturbance.  Respiratory: Positive for cough. Negative for chest tightness.   Gastrointestinal: Positive for nausea and vomiting. Negative for abdominal pain.  Genitourinary: Negative for frequency, difficulty urinating and vaginal pain.  Musculoskeletal: Positive for arthralgias. Negative for back pain and gait problem.  Skin: Negative for pallor and rash.  Neurological: Negative for dizziness, tremors, weakness, numbness and headaches.  Psychiatric/Behavioral: Negative for suicidal ideas, confusion and sleep disturbance. The patient is not nervous/anxious.        Objective:   Physical Exam  Constitutional: She appears well-developed. No distress.  Tired   HENT:  Head: Normocephalic.  Right Ear: External ear normal.  Left Ear: External ear normal.  Nose: Nose normal.  Mouth/Throat: Oropharynx is clear and moist.  Eyes: Conjunctivae are normal. Pupils are equal, round, and reactive to light. Right eye exhibits no discharge. Left eye exhibits no discharge.  Neck: Normal range of motion. Neck supple. No JVD present. No tracheal deviation present. No thyromegaly present.  Cardiovascular: Normal rate, regular rhythm and normal heart sounds.   Pulmonary/Chest: No stridor. No respiratory distress. She has no wheezes. She has rales.  L base  Abdominal: Soft. Bowel sounds are normal. She exhibits no distension and no  mass. There is no tenderness. There is no rebound and no guarding.  Musculoskeletal: She exhibits no edema or tenderness.  Lymphadenopathy:    She has no cervical adenopathy.  Neurological: She displays normal reflexes. No cranial nerve deficit. She exhibits normal muscle tone. Coordination normal.  Skin: No rash noted. She is not diaphoretic. No erythema.  Psychiatric: She has a normal mood and affect. Her behavior is normal. Judgment and thought content normal.    Lab Results  Component Value Date   WBC 8.4 12/16/2013   HGB 13.2 12/16/2013   HCT 39.0 12/16/2013   PLT 259.0 12/16/2013   GLUCOSE 109* 12/16/2013   CHOL 179 07/23/2013   TRIG 122.0 07/23/2013   HDL 63.20 07/23/2013   LDLDIRECT 84.1 07/20/2012   LDLCALC 91 07/23/2013   ALT 25 07/23/2013   AST 26 07/23/2013   NA 132* 12/16/2013   K 3.6 12/16/2013   CL 98 12/16/2013   CREATININE 0.7 12/16/2013   BUN 10 12/16/2013   CO2 24 12/16/2013   TSH 1.05 07/23/2013         Assessment & Plan:

## 2013-12-28 ENCOUNTER — Encounter: Payer: Self-pay | Admitting: Internal Medicine

## 2013-12-28 ENCOUNTER — Ambulatory Visit (INDEPENDENT_AMBULATORY_CARE_PROVIDER_SITE_OTHER): Payer: Medicare Other | Admitting: Internal Medicine

## 2013-12-28 VITALS — BP 140/74 | HR 85 | Temp 97.8°F | Wt 125.0 lb

## 2013-12-28 DIAGNOSIS — J189 Pneumonia, unspecified organism: Secondary | ICD-10-CM

## 2013-12-28 DIAGNOSIS — IMO0001 Reserved for inherently not codable concepts without codable children: Secondary | ICD-10-CM | POA: Insufficient documentation

## 2013-12-28 DIAGNOSIS — J4520 Mild intermittent asthma, uncomplicated: Secondary | ICD-10-CM

## 2013-12-28 DIAGNOSIS — M791 Myalgia: Secondary | ICD-10-CM

## 2013-12-28 DIAGNOSIS — M609 Myositis, unspecified: Secondary | ICD-10-CM

## 2013-12-28 DIAGNOSIS — I10 Essential (primary) hypertension: Secondary | ICD-10-CM

## 2013-12-28 MED ORDER — NAPROXEN SODIUM 220 MG PO TABS: 220.0000 mg | ORAL_TABLET | Freq: Two times a day (BID) | ORAL | Status: DC | PRN

## 2013-12-28 NOTE — Assessment & Plan Note (Signed)
Continue with current prescription therapy as reflected on the Med list.  

## 2013-12-28 NOTE — Assessment & Plan Note (Signed)
11/15 new post-URI -- better Hold Lipitor x 1 mo Call if not better or if worse - labs

## 2013-12-28 NOTE — Patient Instructions (Signed)
Hold Lipitor x 1 month Call if not better or if worse

## 2013-12-28 NOTE — Progress Notes (Signed)
Pre visit review using our clinic review tool, if applicable. No additional management support is needed unless otherwise documented below in the visit note. 

## 2013-12-28 NOTE — Assessment & Plan Note (Signed)
Doing well 

## 2013-12-28 NOTE — Progress Notes (Signed)
Subjective:    HPI   F/u cough, fever, nausea and weakness - better. CXR was clear. Re-started Lipitor last night. C/o pain in shoulders and calves...    Past Medical History   Diagnosis  Date   .  Insomnia    .  History of colon polyps    .  DJD (degenerative joint disease)    .  Hyperlipidemia    .  History of asthma    .  Hypertension     Past Surgical History   Procedure  Laterality  Date   .  Polypectomy     .  Dilation and curettage of uterus     .  Tubal ligation     .  Other surgical history   09/2011     mammogram and bone density    Family History   Problem  Relation  Age of Onset   .  Cancer  Mother    .  Hypertension  Mother    .  Cancer  Father     History    Social History   .  Marital Status:  Married     Spouse Name:  N/A     Number of Children:  N/A   .  Years of Education:  N/A    Occupational History   .  Not on file.    Social History Main Topics   .  Smoking status:  Former Research scientist (life sciences)   .  Smokeless tobacco:  Not on file   .  Alcohol Use:  Yes      Comment: wine   .  Drug Use:  No   .  Sexually Active:  Not on file    Other Topics  Concern   .  Not on file    Social History Narrative    HSG    Married '61    2 sons- '69, '78, 2 daughters- '63, '65, 4 grandchildren    Worked- Magazine features editor, Medical sales representative work, homemaker    Current Outpatient Prescriptions on File Prior to Visit   Medication  Sig  Dispense  Refill   .  amLODipine (NORVASC) 10 MG tablet  Take 10 mg by mouth daily.     Marland Kitchen  aspirin 81 MG tablet  Take 81 mg by mouth daily.     Marland Kitchen  atorvastatin (LIPITOR) 20 MG tablet  Take 1 tablet (20 mg total) by mouth daily.  30 tablet  2   .  benazepril (LOTENSIN) 20 MG tablet  TAKE 1 TABLET BY MOUTH DAILY  90 tablet  0   .  calcium carbonate (OS-CAL) 600 MG TABS  Take 600 mg by mouth 2 (two) times daily with a meal.     .  diazepam (VALIUM) 5 MG tablet  Take 1 tablet (5 mg total) by mouth every 6 (six) hours as needed for anxiety.  30 tablet  1     .  glucosamine-chondroitin 500-400 MG tablet  Take 1 tablet by mouth 2 (two) times daily.     .  hydrochlorothiazide (HYDRODIURIL) 25 MG tablet  TAKE 1 TABLET BY MOUTH ONCE DAILY  90 tablet  1   .  MULTIPLE VITAMIN PO  Take by mouth.      No current facility-administered medications on file prior to visit.       Review of Systems  Constitutional: Positive for fatigue. Negative for fever, chills, diaphoresis, activity change, appetite change and unexpected weight change.  HENT: Negative for congestion,  mouth sores and sinus pressure.   Eyes: Negative for visual disturbance.  Respiratory: Positive for cough. Negative for chest tightness.   Gastrointestinal: Negative for nausea, vomiting and abdominal pain.  Genitourinary: Negative for frequency, difficulty urinating and vaginal pain.  Musculoskeletal: Positive for myalgias and arthralgias. Negative for back pain and gait problem.  Skin: Negative for pallor and rash.  Neurological: Negative for dizziness, tremors, weakness, numbness and headaches.  Psychiatric/Behavioral: Negative for suicidal ideas, confusion and sleep disturbance. The patient is not nervous/anxious.        Objective:   Physical Exam  Constitutional: She appears well-developed. No distress.  Looks much better  HENT:  Head: Normocephalic.  Right Ear: External ear normal.  Left Ear: External ear normal.  Nose: Nose normal.  Mouth/Throat: Oropharynx is clear and moist.  Eyes: Conjunctivae are normal. Pupils are equal, round, and reactive to light. Right eye exhibits no discharge. Left eye exhibits no discharge.  Neck: Normal range of motion. Neck supple. No JVD present. No tracheal deviation present. No thyromegaly present.  Cardiovascular: Normal rate, regular rhythm and normal heart sounds.   Pulmonary/Chest: No stridor. No respiratory distress. She has no wheezes. She has rales.  L base  Abdominal: Soft. Bowel sounds are normal. She exhibits no distension and no  mass. There is no tenderness. There is no rebound and no guarding.  Musculoskeletal: She exhibits no edema or tenderness.  Lymphadenopathy:    She has no cervical adenopathy.  Neurological: She displays normal reflexes. No cranial nerve deficit. She exhibits normal muscle tone. Coordination normal.  Skin: No rash noted. She is not diaphoretic. No erythema.  Psychiatric: She has a normal mood and affect. Her behavior is normal. Judgment and thought content normal.    Lab Results  Component Value Date   WBC 8.4 12/16/2013   HGB 13.2 12/16/2013   HCT 39.0 12/16/2013   PLT 259.0 12/16/2013   GLUCOSE 109* 12/16/2013   CHOL 179 07/23/2013   TRIG 122.0 07/23/2013   HDL 63.20 07/23/2013   LDLDIRECT 84.1 07/20/2012   LDLCALC 91 07/23/2013   ALT 25 07/23/2013   AST 26 07/23/2013   NA 132* 12/16/2013   K 3.6 12/16/2013   CL 98 12/16/2013   CREATININE 0.7 12/16/2013   BUN 10 12/16/2013   CO2 24 12/16/2013   TSH 1.05 07/23/2013         Assessment & Plan:

## 2013-12-28 NOTE — Assessment & Plan Note (Signed)
Better  

## 2013-12-29 ENCOUNTER — Telehealth: Payer: Self-pay | Admitting: Internal Medicine

## 2013-12-29 NOTE — Telephone Encounter (Signed)
emmi emailed °

## 2014-01-04 ENCOUNTER — Telehealth: Payer: Self-pay | Admitting: Internal Medicine

## 2014-01-04 ENCOUNTER — Other Ambulatory Visit: Payer: Self-pay

## 2014-01-04 DIAGNOSIS — M5440 Lumbago with sciatica, unspecified side: Secondary | ICD-10-CM

## 2014-01-04 DIAGNOSIS — M159 Polyosteoarthritis, unspecified: Secondary | ICD-10-CM

## 2014-01-04 MED ORDER — HYDROCHLOROTHIAZIDE 25 MG PO TABS: 25.0000 mg | ORAL_TABLET | Freq: Every day | ORAL | Status: DC

## 2014-01-04 NOTE — Telephone Encounter (Signed)
Pt calling again please see note from earlier today, husband wants a call.

## 2014-01-04 NOTE — Telephone Encounter (Signed)
Pls come to lab for ESR, CBC, LFTs, CK, aldolase, UA OV w/me Thur 11:45 Thx

## 2014-01-04 NOTE — Telephone Encounter (Signed)
Pt seen by PCP last week, pt still in pain - Dr told pt to call in a week if not improved and labs? Pls advise and let pt know.

## 2014-01-05 ENCOUNTER — Other Ambulatory Visit (INDEPENDENT_AMBULATORY_CARE_PROVIDER_SITE_OTHER): Payer: Medicare Other

## 2014-01-05 ENCOUNTER — Ambulatory Visit: Payer: Medicare Other | Admitting: Internal Medicine

## 2014-01-05 DIAGNOSIS — M5440 Lumbago with sciatica, unspecified side: Secondary | ICD-10-CM

## 2014-01-05 DIAGNOSIS — M159 Polyosteoarthritis, unspecified: Secondary | ICD-10-CM

## 2014-01-05 LAB — CBC WITH DIFFERENTIAL/PLATELET
BASOS ABS: 0 10*3/uL (ref 0.0–0.1)
Basophils Relative: 0.5 % (ref 0.0–3.0)
Eosinophils Absolute: 0.2 10*3/uL (ref 0.0–0.7)
Eosinophils Relative: 3.9 % (ref 0.0–5.0)
HEMATOCRIT: 37.5 % (ref 36.0–46.0)
Hemoglobin: 12.8 g/dL (ref 12.0–15.0)
LYMPHS ABS: 2 10*3/uL (ref 0.7–4.0)
Lymphocytes Relative: 34.4 % (ref 12.0–46.0)
MCHC: 34.3 g/dL (ref 30.0–36.0)
MCV: 90.4 fl (ref 78.0–100.0)
MONO ABS: 0.4 10*3/uL (ref 0.1–1.0)
MONOS PCT: 7.1 % (ref 3.0–12.0)
Neutro Abs: 3.1 10*3/uL (ref 1.4–7.7)
Neutrophils Relative %: 54.1 % (ref 43.0–77.0)
PLATELETS: 312 10*3/uL (ref 150.0–400.0)
RBC: 4.15 Mil/uL (ref 3.87–5.11)
RDW: 11.7 % (ref 11.5–15.5)
WBC: 5.8 10*3/uL (ref 4.0–10.5)

## 2014-01-05 LAB — HEPATIC FUNCTION PANEL
ALT: 19 U/L (ref 0–35)
AST: 21 U/L (ref 0–37)
Albumin: 4.3 g/dL (ref 3.5–5.2)
Alkaline Phosphatase: 50 U/L (ref 39–117)
BILIRUBIN TOTAL: 0.4 mg/dL (ref 0.2–1.2)
Bilirubin, Direct: 0.1 mg/dL (ref 0.0–0.3)
Total Protein: 7.1 g/dL (ref 6.0–8.3)

## 2014-01-05 LAB — SEDIMENTATION RATE: Sed Rate: 23 mm/hr — ABNORMAL HIGH (ref 0–22)

## 2014-01-05 LAB — URINALYSIS, ROUTINE W REFLEX MICROSCOPIC
Bilirubin Urine: NEGATIVE
Ketones, ur: NEGATIVE
LEUKOCYTES UA: NEGATIVE
Nitrite: NEGATIVE
SPECIFIC GRAVITY, URINE: 1.01 (ref 1.000–1.030)
Total Protein, Urine: NEGATIVE
UROBILINOGEN UA: 0.2 (ref 0.0–1.0)
Urine Glucose: NEGATIVE
pH: 6.5 (ref 5.0–8.0)

## 2014-01-05 LAB — CK: CK TOTAL: 65 U/L (ref 7–177)

## 2014-01-05 NOTE — Telephone Encounter (Signed)
Whitney Benson notified pt husband with md response. Placing lab orders...Whitney Benson

## 2014-01-06 ENCOUNTER — Ambulatory Visit (INDEPENDENT_AMBULATORY_CARE_PROVIDER_SITE_OTHER): Payer: Medicare Other | Admitting: Internal Medicine

## 2014-01-06 ENCOUNTER — Telehealth: Payer: Self-pay

## 2014-01-06 ENCOUNTER — Encounter: Payer: Self-pay | Admitting: Internal Medicine

## 2014-01-06 VITALS — BP 150/90 | HR 80 | Temp 97.8°F | Resp 16 | Wt 125.0 lb

## 2014-01-06 DIAGNOSIS — J45909 Unspecified asthma, uncomplicated: Secondary | ICD-10-CM

## 2014-01-06 DIAGNOSIS — M25519 Pain in unspecified shoulder: Secondary | ICD-10-CM

## 2014-01-06 DIAGNOSIS — J189 Pneumonia, unspecified organism: Secondary | ICD-10-CM

## 2014-01-06 MED ORDER — METHYLPREDNISOLONE ACETATE 40 MG/ML IJ SUSP: 80.0000 mg | Freq: Once | INTRAMUSCULAR | Status: DC

## 2014-01-06 MED ORDER — METHYLPREDNISOLONE ACETATE 80 MG/ML IJ SUSP
80.0000 mg | Freq: Once | INTRAMUSCULAR | Status: AC
  Administered 2014-01-06: 80 mg via INTRA_ARTICULAR

## 2014-01-06 NOTE — Assessment & Plan Note (Signed)
11/15 B - no evidence of PMR Likely OA L>R  Discussed Rest  Will inject w/Depo

## 2014-01-06 NOTE — Progress Notes (Signed)
Subjective:    HPI   F/u cough, fever, nausea and weakness - better. CXR was clear. Re-started Lipitor last night. C/o pain in shoulders and calves...    Past Medical History   Diagnosis  Date   .  Insomnia    .  History of colon polyps    .  DJD (degenerative joint disease)    .  Hyperlipidemia    .  History of asthma    .  Hypertension     Past Surgical History   Procedure  Laterality  Date   .  Polypectomy     .  Dilation and curettage of uterus     .  Tubal ligation     .  Other surgical history   09/2011     mammogram and bone density    Family History   Problem  Relation  Age of Onset   .  Cancer  Mother    .  Hypertension  Mother    .  Cancer  Father     History    Social History   .  Marital Status:  Married     Spouse Name:  N/A     Number of Children:  N/A   .  Years of Education:  N/A    Occupational History   .  Not on file.    Social History Main Topics   .  Smoking status:  Former Research scientist (life sciences)   .  Smokeless tobacco:  Not on file   .  Alcohol Use:  Yes      Comment: wine   .  Drug Use:  No   .  Sexually Active:  Not on file    Other Topics  Concern   .  Not on file    Social History Narrative    HSG    Married '61    2 sons- '69, '78, 2 daughters- '63, '65, 4 grandchildren    Worked- Magazine features editor, Medical sales representative work, homemaker    Current Outpatient Prescriptions on File Prior to Visit   Medication  Sig  Dispense  Refill   .  amLODipine (NORVASC) 10 MG tablet  Take 10 mg by mouth daily.     Marland Kitchen  aspirin 81 MG tablet  Take 81 mg by mouth daily.     Marland Kitchen  atorvastatin (LIPITOR) 20 MG tablet  Take 1 tablet (20 mg total) by mouth daily.  30 tablet  2   .  benazepril (LOTENSIN) 20 MG tablet  TAKE 1 TABLET BY MOUTH DAILY  90 tablet  0   .  calcium carbonate (OS-CAL) 600 MG TABS  Take 600 mg by mouth 2 (two) times daily with a meal.     .  diazepam (VALIUM) 5 MG tablet  Take 1 tablet (5 mg total) by mouth every 6 (six) hours as needed for anxiety.  30 tablet  1     .  glucosamine-chondroitin 500-400 MG tablet  Take 1 tablet by mouth 2 (two) times daily.     .  hydrochlorothiazide (HYDRODIURIL) 25 MG tablet  TAKE 1 TABLET BY MOUTH ONCE DAILY  90 tablet  1   .  MULTIPLE VITAMIN PO  Take by mouth.      No current facility-administered medications on file prior to visit.       Review of Systems  Constitutional: Positive for fatigue. Negative for fever, chills, diaphoresis, activity change, appetite change and unexpected weight change.  HENT: Negative for congestion,  mouth sores and sinus pressure.   Eyes: Negative for visual disturbance.  Respiratory: Positive for cough. Negative for chest tightness.   Gastrointestinal: Negative for nausea, vomiting and abdominal pain.  Genitourinary: Negative for frequency, difficulty urinating and vaginal pain.  Musculoskeletal: Positive for myalgias and arthralgias. Negative for back pain and gait problem.  Skin: Negative for pallor and rash.  Neurological: Negative for dizziness, tremors, weakness, numbness and headaches.  Psychiatric/Behavioral: Negative for suicidal ideas, confusion and sleep disturbance. The patient is not nervous/anxious.        Objective:   Physical Exam  Constitutional: She appears well-developed. No distress.  Looks much better  HENT:  Head: Normocephalic.  Right Ear: External ear normal.  Left Ear: External ear normal.  Nose: Nose normal.  Mouth/Throat: Oropharynx is clear and moist.  Eyes: Conjunctivae are normal. Pupils are equal, round, and reactive to light. Right eye exhibits no discharge. Left eye exhibits no discharge.  Neck: Normal range of motion. Neck supple. No JVD present. No tracheal deviation present. No thyromegaly present.  Cardiovascular: Normal rate, regular rhythm and normal heart sounds.   Pulmonary/Chest: No stridor. No respiratory distress. She has no wheezes. She has rales.  L base  Abdominal: Soft. Bowel sounds are normal. She exhibits no distension and no  mass. There is no tenderness. There is no rebound and no guarding.  Musculoskeletal: She exhibits no edema or tenderness.  Lymphadenopathy:    She has no cervical adenopathy.  Neurological: She displays normal reflexes. No cranial nerve deficit. She exhibits normal muscle tone. Coordination normal.  Skin: No rash noted. She is not diaphoretic. No erythema.  Psychiatric: She has a normal mood and affect. Her behavior is normal. Judgment and thought content normal.    Lab Results  Component Value Date   WBC 5.8 01/05/2014   HGB 12.8 01/05/2014   HCT 37.5 01/05/2014   PLT 312.0 01/05/2014   GLUCOSE 109* 12/16/2013   CHOL 179 07/23/2013   TRIG 122.0 07/23/2013   HDL 63.20 07/23/2013   LDLDIRECT 84.1 07/20/2012   LDLCALC 91 07/23/2013   ALT 19 01/05/2014   AST 21 01/05/2014   NA 132* 12/16/2013   K 3.6 12/16/2013   CL 98 12/16/2013   CREATININE 0.7 12/16/2013   BUN 10 12/16/2013   CO2 24 12/16/2013   TSH 1.05 07/23/2013     Procedure :Joint Injection, B  shoulder   Indication:  Subacromial bursitis with refractory  chronic pain.   Risks including unsuccessful procedure , bleeding, infection, bruising, skin atrophy, "steroid flare-up" and others were explained to the patient in detail as well as the benefits. Informed consent was obtained and signed.   Tthe patient was placed in a comfortable position. Lateral approach was used. Skin was prepped with Betadine and alcohol  and anesthetized with a cooling spray. Then, a 5 cc syringe with a 2 inch long 24-gauge needle was used for a joint injection.. The needle was advanced  Into the subacromial space.The bursa was injected with 3 mL of 2% lidocaine and 40 mg of Depo-Medrol each .  Band-Aid was applied B.   Tolerated well. Complications: None. Good pain relief following the procedure.   Postprocedure instructions :    A Band-Aid should be left on for 12 hours. Injection therapy is not a cure itself. It is used in conjunction with  other modalities. You can use nonsteroidal anti-inflammatories like ibuprofen , hot and cold compresses. Rest is recommended in the next 24 hours. You need to  report immediately  if fever, chills or any signs of infection develop.       Assessment & Plan:

## 2014-01-06 NOTE — Telephone Encounter (Signed)
For this patient: Pt stated that MD stated that she was going to stop the atorvastatin for a month.  It is indicated that there is sporadic refills of the Atorvastatin 20 mg. Please also think about rxing the 90 day supply of this med if pt agrees.

## 2014-01-06 NOTE — Assessment & Plan Note (Signed)
No sx lately

## 2014-01-06 NOTE — Telephone Encounter (Signed)
Cont to hold Lipitor Thx

## 2014-01-06 NOTE — Assessment & Plan Note (Signed)
Resolved

## 2014-01-06 NOTE — Patient Instructions (Signed)
Postprocedure instructions :    A Band-Aid should be left on for 12 hours. Injection therapy is not a cure itself. It is used in conjunction with other modalities. You can use nonsteroidal anti-inflammatories like ibuprofen , hot and cold compresses. Rest is recommended in the next 24 hours. You need to report immediately  if fever, chills or any signs of infection develop. 

## 2014-01-06 NOTE — Progress Notes (Signed)
Pre visit review using our clinic review tool, if applicable. No additional management support is needed unless otherwise documented below in the visit note. 

## 2014-01-07 LAB — ALDOLASE: ALDOLASE: 4.6 U/L (ref <=8.1)

## 2014-01-10 ENCOUNTER — Telehealth: Payer: Self-pay

## 2014-01-10 NOTE — Telephone Encounter (Signed)
See 01/06/14 phone note, MD advised pt to continue to hold Lipitor.

## 2014-01-10 NOTE — Telephone Encounter (Signed)
Whitney Benson ([birth year])  Atorvastatin 20 mg (83% adherent) o States that her refills are sporadic due to accumulation o States that her and Dr. Lajuana Carry discussed her stopping medication for 1 month o Filled #30 on 6-16, 8-4, 9-28, and 10-27  Benazepril 20 mg (100% adherent) o 90 day Rx  ACTION ITEMS: o Follow up on MD stopping atorvastatin 20 mg x 1 mth o Consider re-writing atorvastatin Rx for 90 day supply

## 2014-04-28 ENCOUNTER — Other Ambulatory Visit: Payer: Self-pay | Admitting: Internal Medicine

## 2014-06-02 ENCOUNTER — Other Ambulatory Visit: Payer: Self-pay | Admitting: Internal Medicine

## 2014-06-02 NOTE — Telephone Encounter (Signed)
Notified pharmacy spoke with elizabeth gave md approval.../lmb

## 2014-09-29 ENCOUNTER — Other Ambulatory Visit: Payer: Self-pay | Admitting: Internal Medicine

## 2014-11-09 ENCOUNTER — Ambulatory Visit: Payer: Medicare Other

## 2014-11-09 DIAGNOSIS — Z23 Encounter for immunization: Secondary | ICD-10-CM

## 2014-11-19 ENCOUNTER — Other Ambulatory Visit: Payer: Self-pay | Admitting: Internal Medicine

## 2014-12-06 DIAGNOSIS — Z1231 Encounter for screening mammogram for malignant neoplasm of breast: Secondary | ICD-10-CM | POA: Diagnosis not present

## 2014-12-06 DIAGNOSIS — Z6824 Body mass index (BMI) 24.0-24.9, adult: Secondary | ICD-10-CM | POA: Diagnosis not present

## 2014-12-06 DIAGNOSIS — Z01419 Encounter for gynecological examination (general) (routine) without abnormal findings: Secondary | ICD-10-CM | POA: Diagnosis not present

## 2014-12-07 ENCOUNTER — Ambulatory Visit (INDEPENDENT_AMBULATORY_CARE_PROVIDER_SITE_OTHER): Payer: Medicare Other | Admitting: Family

## 2014-12-07 ENCOUNTER — Encounter: Payer: Self-pay | Admitting: Family

## 2014-12-07 VITALS — BP 168/100 | HR 66 | Temp 97.6°F | Resp 18 | Ht 60.0 in | Wt 128.0 lb

## 2014-12-07 DIAGNOSIS — I10 Essential (primary) hypertension: Secondary | ICD-10-CM

## 2014-12-07 MED ORDER — BENAZEPRIL HCL 40 MG PO TABS: ORAL_TABLET | ORAL | Status: DC

## 2014-12-07 NOTE — Progress Notes (Signed)
Subjective:    Patient ID: Whitney Benson, female    DOB: 1934/03/18, 80 y.o.   MRN: [id number]  Chief Complaint  Patient presents with  . Blood Pressure Check    states that when she went to her GYN yesterday her BP was in the 200s, when she went home she states it came down    HPI:  Whitney Benson is a 80 y.o. female who  has a past medical history of Insomnia; History of colon polyps; DJD (degenerative joint disease); Hyperlipidemia; History of asthma; and Hypertension. and presents today for an acute office visit.  Hypertension is currently managed with benazepril and hydrochlorothiazide. Takes the medications as prescribed and denies adverse side effects. Was at her GYN appointment yesterday and was noted to have a systolic pressure in the 938'H. At home her blood pressure came down to the 180's. Does not generally take her blood pressure at home.   BP Readings from Last 3 Encounters:  12/07/14 168/100  01/06/14 150/90  12/28/13 140/74    No Known Allergies   Current Outpatient Prescriptions on File Prior to Visit  Medication Sig Dispense Refill  . aspirin 81 MG tablet Take 81 mg by mouth daily.      Marland Kitchen atorvastatin (LIPITOR) 20 MG tablet TAKE 1 TABLET BY MOUTH DAILY 90 tablet 1  . diazepam (VALIUM) 5 MG tablet TAKE 1 TABLET BY MOUTH EVERY 6 HOURS AS NEEDED FOR ANXIETY 30 tablet 5  . hydrochlorothiazide (HYDRODIURIL) 25 MG tablet Take 1 tablet (25 mg total) by mouth daily. 90 tablet 3  . MULTIPLE VITAMIN PO Take by mouth.       No current facility-administered medications on file prior to visit.      Past Surgical History  Procedure Laterality Date  . Polypectomy    . Dilation and curettage of uterus    . Tubal ligation    . Other surgical history  09/2011    mammogram and bone density    Review of Systems  Constitutional: Negative for fever and chills.  Eyes:       Negative for changes in vision.   Respiratory: Negative for chest tightness and shortness of  breath.   Cardiovascular: Negative for chest pain, palpitations and leg swelling.  Neurological: Negative for headaches.      Objective:    BP 168/100 mmHg  Pulse 66  Temp(Src) 97.6 F (36.4 C) (Oral)  Resp 18  Ht 5' (1.524 m)  Wt 128 lb (58.06 kg)  BMI 25.00 kg/m2  SpO2 98% Nursing note and vital signs reviewed.  Physical Exam  Constitutional: She is oriented to person, place, and time. She appears well-developed and well-nourished. No distress.  Cardiovascular: Normal rate, regular rhythm, normal heart sounds and intact distal pulses.   Pulmonary/Chest: Effort normal and breath sounds normal.  Neurological: She is alert and oriented to person, place, and time.  Skin: Skin is warm and dry.  Psychiatric: She has a normal mood and affect. Her behavior is normal. Judgment and thought content normal.       Assessment & Plan:   Problem List Items Addressed This Visit      Cardiovascular and Mediastinum   Essential hypertension - Primary    Blood pressure elevated during the past 2 medical office visits over goal of 150/90. Takes her medication as prescribed and denies symptoms of end organ damage. Increase benazepril to 40 mg. Continue current dosage of hydrochlorothiazide. Monitor blood pressure at home. Follow up in  3 weeks for blood pressure check.       Relevant Medications   benazepril (LOTENSIN) 40 MG tablet

## 2014-12-07 NOTE — Assessment & Plan Note (Signed)
Blood pressure elevated during the past 2 medical office visits over goal of 150/90. Takes her medication as prescribed and denies symptoms of end organ damage. Increase benazepril to 40 mg. Continue current dosage of hydrochlorothiazide. Monitor blood pressure at home. Follow up in 3 weeks for blood pressure check.

## 2014-12-07 NOTE — Progress Notes (Signed)
Pre visit review using our clinic review tool, if applicable. No additional management support is needed unless otherwise documented below in the visit note. 

## 2014-12-07 NOTE — Patient Instructions (Signed)
Thank you for choosing [facility].  Summary/Instructions:  Please monitor your blood pressure at home.   Please increase your benazepril to 40 mg daily.   Your prescription(s) have been submitted to your pharmacy or been printed and provided for you. Please take as directed and contact our office if you believe you are having problem(s) with the medication(s) or have any questions.  If your symptoms worsen or fail to improve, please contact our office for further instruction, or in case of emergency go directly to the emergency room at the closest medical facility.

## 2015-01-03 ENCOUNTER — Encounter: Payer: Self-pay | Admitting: Internal Medicine

## 2015-01-03 ENCOUNTER — Ambulatory Visit (INDEPENDENT_AMBULATORY_CARE_PROVIDER_SITE_OTHER): Payer: Medicare Other | Admitting: Internal Medicine

## 2015-01-03 VITALS — BP 132/84 | HR 75 | Wt 126.0 lb

## 2015-01-03 DIAGNOSIS — F418 Other specified anxiety disorders: Secondary | ICD-10-CM

## 2015-01-03 DIAGNOSIS — I10 Essential (primary) hypertension: Secondary | ICD-10-CM | POA: Diagnosis not present

## 2015-01-03 NOTE — Assessment & Plan Note (Signed)
Benazepril, HCTZ Better controlled

## 2015-01-03 NOTE — Assessment & Plan Note (Signed)
Diazepam prn  Potential benefits of a long term benzodiazepines  use as well as potential risks  and complications were explained to the patient and were aknowledged. 

## 2015-01-03 NOTE — Progress Notes (Signed)
Pre visit review using our clinic review tool, if applicable. No additional management support is needed unless otherwise documented below in the visit note. 

## 2015-01-03 NOTE — Progress Notes (Signed)
Subjective:  Patient ID: Whitney Benson, female    DOB: 1935/02/12  Age: 80 y.o. MRN: [id number]  CC: No chief complaint on file.   HPI LANDEN GOETZMAN presents for HTN. Gregg increased Benazepril to 40 mg a day  Outpatient Prescriptions Prior to Visit  Medication Sig Dispense Refill  . aspirin 81 MG tablet Take 81 mg by mouth daily.      Marland Kitchen atorvastatin (LIPITOR) 20 MG tablet TAKE 1 TABLET BY MOUTH DAILY 90 tablet 1  . benazepril (LOTENSIN) 40 MG tablet TAKE 1 TABLET BY MOUTH ONCE DAILY 30 tablet 1  . diazepam (VALIUM) 5 MG tablet TAKE 1 TABLET BY MOUTH EVERY 6 HOURS AS NEEDED FOR ANXIETY 30 tablet 5  . hydrochlorothiazide (HYDRODIURIL) 25 MG tablet Take 1 tablet (25 mg total) by mouth daily. 90 tablet 3  . MULTIPLE VITAMIN PO Take by mouth.       No facility-administered medications prior to visit.    ROS Review of Systems  Constitutional: Negative for chills, activity change, appetite change, fatigue and unexpected weight change.  HENT: Negative for congestion, mouth sores and sinus pressure.   Eyes: Negative for visual disturbance.  Respiratory: Negative for cough and chest tightness.   Gastrointestinal: Negative for nausea and abdominal pain.  Genitourinary: Negative for frequency, difficulty urinating and vaginal pain.  Musculoskeletal: Negative for back pain and gait problem.  Skin: Negative for pallor and rash.  Neurological: Negative for dizziness, tremors, weakness, numbness and headaches.  Psychiatric/Behavioral: Negative for confusion and sleep disturbance. The patient is not nervous/anxious.     Objective:  BP 132/84 mmHg  Pulse 75  Wt 126 lb (57.153 kg)  SpO2 94%  BP Readings from Last 3 Encounters:  01/03/15 132/84  12/07/14 168/100  01/06/14 150/90    Wt Readings from Last 3 Encounters:  01/03/15 126 lb (57.153 kg)  12/07/14 128 lb (58.06 kg)  01/06/14 125 lb (56.7 kg)    Physical Exam  Constitutional: She appears well-developed. No distress.    HENT:  Head: Normocephalic.  Right Ear: External ear normal.  Left Ear: External ear normal.  Nose: Nose normal.  Mouth/Throat: Oropharynx is clear and moist.  Eyes: Conjunctivae are normal. Pupils are equal, round, and reactive to light. Right eye exhibits no discharge. Left eye exhibits no discharge.  Neck: Normal range of motion. Neck supple. No JVD present. No tracheal deviation present. No thyromegaly present.  Cardiovascular: Normal rate, regular rhythm and normal heart sounds.   Pulmonary/Chest: No stridor. No respiratory distress. She has no wheezes.  Abdominal: Soft. Bowel sounds are normal. She exhibits no distension and no mass. There is no tenderness. There is no rebound and no guarding.  Musculoskeletal: She exhibits no edema or tenderness.  Lymphadenopathy:    She has no cervical adenopathy.  Neurological: She displays normal reflexes. No cranial nerve deficit. She exhibits normal muscle tone. Coordination normal.  Skin: No rash noted. No erythema.  Psychiatric: She has a normal mood and affect. Her behavior is normal. Judgment and thought content normal.    Lab Results  Component Value Date   WBC 5.8 01/05/2014   HGB 12.8 01/05/2014   HCT 37.5 01/05/2014   PLT 312.0 01/05/2014   GLUCOSE 109* 12/16/2013   CHOL 179 07/23/2013   TRIG 122.0 07/23/2013   HDL 63.20 07/23/2013   LDLDIRECT 84.1 07/20/2012   LDLCALC 91 07/23/2013   ALT 19 01/05/2014   AST 21 01/05/2014   NA 132* 12/16/2013   K  3.6 12/16/2013   CL 98 12/16/2013   CREATININE 0.7 12/16/2013   BUN 10 12/16/2013   CO2 24 12/16/2013   TSH 1.05 07/23/2013    Dg Chest 2 View  12/16/2013  CLINICAL DATA:  Cough, shortness of breath, abdominal pain for 2 days, possible pneumonia EXAM: CHEST  2 VIEW COMPARISON:  None. FINDINGS: Cardiomediastinal silhouette is unremarkable. No acute infiltrate or pleural effusion. No pulmonary edema. Degenerative changes noted mid and lower thoracic spine. IMPRESSION: No active  cardiopulmonary disease. Electronically Signed   By: Lahoma Crocker M.D.   On: 12/16/2013 15:25    Assessment & Plan:   There are no diagnoses linked to this encounter. I am having Ms. Bozich maintain her aspirin, MULTIPLE VITAMIN PO, hydrochlorothiazide, diazepam, atorvastatin, benazepril, and meloxicam.  Meds ordered this encounter  Medications  . meloxicam (MOBIC) 15 MG tablet    Sig: as needed.     Follow-up: No Follow-up on file.  Walker Kehr, MD

## 2015-01-17 ENCOUNTER — Other Ambulatory Visit: Payer: Self-pay | Admitting: Internal Medicine

## 2015-01-30 ENCOUNTER — Encounter: Payer: Self-pay | Admitting: Internal Medicine

## 2015-03-01 ENCOUNTER — Encounter: Payer: Self-pay | Admitting: Internal Medicine

## 2015-03-01 ENCOUNTER — Ambulatory Visit (INDEPENDENT_AMBULATORY_CARE_PROVIDER_SITE_OTHER)
Admission: RE | Admit: 2015-03-01 | Discharge: 2015-03-01 | Disposition: A | Discharge status: Home / Self Care | Payer: Medicare Other | Source: Ambulatory Visit | Attending: Internal Medicine | Admitting: Internal Medicine

## 2015-03-01 ENCOUNTER — Ambulatory Visit (INDEPENDENT_AMBULATORY_CARE_PROVIDER_SITE_OTHER): Payer: Medicare Other | Admitting: Internal Medicine

## 2015-03-01 ENCOUNTER — Other Ambulatory Visit (INDEPENDENT_AMBULATORY_CARE_PROVIDER_SITE_OTHER): Payer: Medicare Other

## 2015-03-01 VITALS — BP 160/90 | HR 74 | Ht 60.0 in | Wt 130.0 lb

## 2015-03-01 DIAGNOSIS — I10 Essential (primary) hypertension: Secondary | ICD-10-CM | POA: Diagnosis not present

## 2015-03-01 DIAGNOSIS — R0789 Other chest pain: Secondary | ICD-10-CM

## 2015-03-01 DIAGNOSIS — E785 Hyperlipidemia, unspecified: Secondary | ICD-10-CM

## 2015-03-01 DIAGNOSIS — F418 Other specified anxiety disorders: Secondary | ICD-10-CM | POA: Diagnosis not present

## 2015-03-01 DIAGNOSIS — K219 Gastro-esophageal reflux disease without esophagitis: Secondary | ICD-10-CM

## 2015-03-01 DIAGNOSIS — Z0000 Encounter for general adult medical examination without abnormal findings: Secondary | ICD-10-CM

## 2015-03-01 DIAGNOSIS — R079 Chest pain, unspecified: Secondary | ICD-10-CM

## 2015-03-01 DIAGNOSIS — J4520 Mild intermittent asthma, uncomplicated: Secondary | ICD-10-CM

## 2015-03-01 LAB — TROPONIN I: TNIDX: 0.01 ug/L (ref 0.00–0.06)

## 2015-03-01 LAB — BASIC METABOLIC PANEL
BUN: 16 mg/dL (ref 6–23)
CALCIUM: 9.7 mg/dL (ref 8.4–10.5)
CO2: 31 mEq/L (ref 19–32)
Chloride: 102 mEq/L (ref 96–112)
Creatinine, Ser: 0.78 mg/dL (ref 0.40–1.20)
GFR: 75.77 mL/min (ref >60.00)
GLUCOSE: 102 mg/dL — AB (ref 70–99)
Potassium: 3.8 mEq/L (ref 3.5–5.1)
SODIUM: 141 meq/L (ref 135–145)

## 2015-03-01 LAB — HEPATIC FUNCTION PANEL
ALBUMIN: 4.3 g/dL (ref 3.5–5.2)
ALT: 20 U/L (ref 0–35)
AST: 22 U/L (ref 0–37)
Alkaline Phosphatase: 58 U/L (ref 39–117)
BILIRUBIN DIRECT: 0.1 mg/dL (ref 0.0–0.3)
TOTAL PROTEIN: 6.8 g/dL (ref 6.0–8.3)
Total Bilirubin: 0.4 mg/dL (ref 0.2–1.2)

## 2015-03-01 LAB — URINALYSIS
Bilirubin Urine: NEGATIVE
KETONES UR: NEGATIVE
LEUKOCYTES UA: NEGATIVE
Nitrite: NEGATIVE
Total Protein, Urine: NEGATIVE
URINE GLUCOSE: NEGATIVE
UROBILINOGEN UA: 0.2 (ref 0.0–1.0)
pH: 7 (ref 5.0–8.0)

## 2015-03-01 LAB — LIPID PANEL
CHOL/HDL RATIO: 3
Cholesterol: 165 mg/dL (ref 0–200)
HDL: 61.5 mg/dL (ref >39.00)
LDL Cholesterol: 75 mg/dL (ref 0–99)
NONHDL: 103.66
TRIGLYCERIDES: 141 mg/dL (ref 0.0–149.0)
VLDL: 28.2 mg/dL (ref 0.0–40.0)

## 2015-03-01 LAB — CBC WITH DIFFERENTIAL/PLATELET
BASOS ABS: 0 10*3/uL (ref 0.0–0.1)
Basophils Relative: 0.5 % (ref 0.0–3.0)
EOS ABS: 0.1 10*3/uL (ref 0.0–0.7)
Eosinophils Relative: 1.5 % (ref 0.0–5.0)
HCT: 39.8 % (ref 36.0–46.0)
Hemoglobin: 13.1 g/dL (ref 12.0–15.0)
LYMPHS ABS: 1.8 10*3/uL (ref 0.7–4.0)
LYMPHS PCT: 32.2 % (ref 12.0–46.0)
MCHC: 33 g/dL (ref 30.0–36.0)
MCV: 93.9 fl (ref 78.0–100.0)
MONOS PCT: 6.9 % (ref 3.0–12.0)
Monocytes Absolute: 0.4 10*3/uL (ref 0.1–1.0)
NEUTROS PCT: 58.9 % (ref 43.0–77.0)
Neutro Abs: 3.3 10*3/uL (ref 1.4–7.7)
Platelets: 248 10*3/uL (ref 150.0–400.0)
RBC: 4.23 Mil/uL (ref 3.87–5.11)
RDW: 12.3 % (ref 11.5–15.5)
WBC: 5.6 10*3/uL (ref 4.0–10.5)

## 2015-03-01 LAB — TSH: TSH: 1.02 u[IU]/mL (ref 0.35–4.50)

## 2015-03-01 LAB — D-DIMER, QUANTITATIVE (NOT AT ARMC): D DIMER QUANT: 0.22 ug{FEU}/mL (ref 0.00–0.48)

## 2015-03-01 MED ORDER — ESOMEPRAZOLE MAGNESIUM 20 MG PO PACK: 20.0000 mg | PACK | Freq: Every day | ORAL | Status: DC

## 2015-03-01 MED ORDER — NITROGLYCERIN 0.4 MG SL SUBL: 0.4000 mg | SUBLINGUAL_TABLET | SUBLINGUAL | Status: DC | PRN

## 2015-03-01 MED ORDER — DIAZEPAM 5 MG PO TABS: 5.0000 mg | ORAL_TABLET | Freq: Four times a day (QID) | ORAL | Status: DC | PRN

## 2015-03-01 MED ORDER — METOPROLOL SUCCINATE ER 25 MG PO TB24: 25.0000 mg | ORAL_TABLET | Freq: Every day | ORAL | Status: DC

## 2015-03-01 MED ORDER — BENAZEPRIL HCL 40 MG PO TABS: ORAL_TABLET | ORAL | Status: DC

## 2015-03-01 NOTE — Assessment & Plan Note (Signed)
Benazepril, HCTZ 1/17 added [facility]

## 2015-03-01 NOTE — Assessment & Plan Note (Signed)
1/17 start Nexium

## 2015-03-01 NOTE — Assessment & Plan Note (Signed)
Diazepam prn  Potential benefits of a long term benzodiazepines  use as well as potential risks  and complications were explained to the patient and were aknowledged. 

## 2015-03-01 NOTE — Assessment & Plan Note (Addendum)
EKG nl CXR Stress test To ER if worse Given nexium

## 2015-03-01 NOTE — Progress Notes (Signed)
Subjective:  Patient ID: Whitney Benson, female    DOB: 1934-04-03  Age: 81 y.o. MRN: [id number]  CC: No chief complaint on file.   HPI Whitney Benson presents for a well exam C/o CP (discofort) in the mid-front off and on x 1 week, no SOB, no leg pain, no sweats. Seems stress related. C/o GERD  Outpatient Prescriptions Prior to Visit  Medication Sig Dispense Refill  . aspirin 81 MG tablet Take 81 mg by mouth daily.      Marland Kitchen atorvastatin (LIPITOR) 20 MG tablet TAKE 1 TABLET BY MOUTH DAILY 90 tablet 1  . hydrochlorothiazide (HYDRODIURIL) 25 MG tablet TAKE 1 TABLET BY MOUTH ONCE DAILY 90 tablet 3  . MULTIPLE VITAMIN PO Take by mouth.      . benazepril (LOTENSIN) 40 MG tablet TAKE 1 TABLET BY MOUTH ONCE DAILY 30 tablet 1  . diazepam (VALIUM) 5 MG tablet TAKE 1 TABLET BY MOUTH EVERY 6 HOURS AS NEEDED FOR ANXIETY 30 tablet 5  . meloxicam (MOBIC) 15 MG tablet as needed.     No facility-administered medications prior to visit.    ROS Review of Systems  Constitutional: Negative for chills, activity change, appetite change, fatigue and unexpected weight change.  HENT: Negative for congestion, mouth sores and sinus pressure.   Eyes: Negative for visual disturbance.  Respiratory: Negative for cough and chest tightness.   Cardiovascular: Positive for chest pain. Negative for palpitations.  Gastrointestinal: Negative for nausea and abdominal pain.  Genitourinary: Negative for frequency, difficulty urinating and vaginal pain.  Musculoskeletal: Negative for back pain and gait problem.  Skin: Negative for pallor and rash.  Neurological: Negative for dizziness, tremors, weakness, numbness and headaches.  Psychiatric/Behavioral: Negative for suicidal ideas, confusion and sleep disturbance.    Objective:  BP 160/90 mmHg  Pulse 74  Ht 5' (1.524 m)  Wt 130 lb (58.968 kg)  BMI 25.39 kg/m2  SpO2 98%  BP Readings from Last 3 Encounters:  03/01/15 160/90  01/03/15 132/84  12/07/14 168/100     Wt Readings from Last 3 Encounters:  03/01/15 130 lb (58.968 kg)  01/03/15 126 lb (57.153 kg)  12/07/14 128 lb (58.06 kg)    Physical Exam  Constitutional: She appears well-developed. No distress.  HENT:  Head: Normocephalic.  Right Ear: External ear normal.  Left Ear: External ear normal.  Nose: Nose normal.  Mouth/Throat: Oropharynx is clear and moist.  Eyes: Conjunctivae are normal. Pupils are equal, round, and reactive to light. Right eye exhibits no discharge. Left eye exhibits no discharge.  Neck: Normal range of motion. Neck supple. No JVD present. No tracheal deviation present. No thyromegaly present.  Cardiovascular: Normal rate, regular rhythm and normal heart sounds.   Pulmonary/Chest: No stridor. No respiratory distress. She has no wheezes.  Abdominal: Soft. Bowel sounds are normal. She exhibits no distension and no mass. There is no tenderness. There is no rebound and no guarding.  Musculoskeletal: She exhibits no edema or tenderness.  Lymphadenopathy:    She has no cervical adenopathy.  Neurological: She displays normal reflexes. No cranial nerve deficit. She exhibits normal muscle tone. Coordination normal.  Skin: No rash noted. No erythema.  Psychiatric: She has a normal mood and affect. Her behavior is normal. Judgment and thought content normal.  chest wall NT Calves NT  Lab Results  Component Value Date   WBC 5.8 01/05/2014   HGB 12.8 01/05/2014   HCT 37.5 01/05/2014   PLT 312.0 01/05/2014   GLUCOSE 109*  12/16/2013   CHOL 179 07/23/2013   TRIG 122.0 07/23/2013   HDL 63.20 07/23/2013   LDLDIRECT 84.1 07/20/2012   LDLCALC 91 07/23/2013   ALT 19 01/05/2014   AST 21 01/05/2014   NA 132* 12/16/2013   K 3.6 12/16/2013   CL 98 12/16/2013   CREATININE 0.7 12/16/2013   BUN 10 12/16/2013   CO2 24 12/16/2013   TSH 1.05 07/23/2013   EKG nl  Dg Chest 2 View  12/16/2013  CLINICAL DATA:  Cough, shortness of breath, abdominal pain for 2 days, possible  pneumonia EXAM: CHEST  2 VIEW COMPARISON:  None. FINDINGS: Cardiomediastinal silhouette is unremarkable. No acute infiltrate or pleural effusion. No pulmonary edema. Degenerative changes noted mid and lower thoracic spine. IMPRESSION: No active cardiopulmonary disease. Electronically Signed   By: Lahoma Crocker M.D.   On: 12/16/2013 15:25    Assessment & Plan:   Diagnoses and all orders for this visit:  Other chest pain -     EKG 12-Lead -     Basic metabolic panel; Future -     CBC with Differential/Platelet; Future -     Hepatic function panel; Future -     Lipid panel; Future -     TSH; Future -     Urinalysis; Future -     Troponin I; Future  Chest pain, unspecified chest pain type -     DG Chest 2 View -     Echo stress; Future -     Basic metabolic panel; Future -     CBC with Differential/Platelet; Future -     Hepatic function panel; Future -     Lipid panel; Future -     TSH; Future -     Urinalysis; Future -     D-dimer, quantitative (not at [facility]); Future  Essential hypertension -     Basic metabolic panel; Future -     CBC with Differential/Platelet; Future -     Hepatic function panel; Future -     Lipid panel; Future -     TSH; Future -     Urinalysis; Future  Well adult exam -     Basic metabolic panel; Future -     CBC with Differential/Platelet; Future -     Hepatic function panel; Future -     Lipid panel; Future -     TSH; Future -     Urinalysis; Future  Dyslipidemia -     Basic metabolic panel; Future -     CBC with Differential/Platelet; Future -     Hepatic function panel; Future -     Lipid panel; Future -     TSH; Future -     Urinalysis; Future  Gastroesophageal reflux disease without esophagitis  Other orders -     Cancel: benazepril (LOTENSIN) 40 MG tablet; TAKE 1 TABLET BY MOUTH ONCE DAILY -     Cancel: diazepam (VALIUM) 5 MG tablet; Take 1 tablet (5 mg total) by mouth every 6 (six) hours as needed. for anxiety -     Cancel: benazepril  (LOTENSIN) 40 MG tablet; TAKE 1 TABLET BY MOUTH ONCE DAILY -     Cancel: diazepam (VALIUM) 5 MG tablet; Take 1 tablet (5 mg total) by mouth every 6 (six) hours as needed. for anxiety -     benazepril (LOTENSIN) 40 MG tablet; TAKE 1 TABLET BY MOUTH ONCE DAILY -     diazepam (VALIUM) 5 MG tablet; Take 1 tablet (  5 mg total) by mouth every 6 (six) hours as needed. for anxiety -     metoprolol succinate (TOPROL-XL) 25 MG 24 hr tablet; Take 1 tablet (25 mg total) by mouth daily. -     nitroGLYCERIN (NITROSTAT) 0.4 MG SL tablet; Place 1 tablet (0.4 mg total) under the tongue every 5 (five) minutes as needed for chest pain. -     esomeprazole (NEXIUM) 20 MG packet; Take 20 mg by mouth daily before breakfast.   I have discontinued Ms. Martinek's meloxicam. I have also changed her diazepam. Additionally, I am having her start on metoprolol succinate, nitroGLYCERIN, and esomeprazole. Lastly, I am having her maintain her aspirin, MULTIPLE VITAMIN PO, atorvastatin, hydrochlorothiazide, and benazepril.  Meds ordered this encounter  Medications  . benazepril (LOTENSIN) 40 MG tablet    Sig: TAKE 1 TABLET BY MOUTH ONCE DAILY    Dispense:  90 tablet    Refill:  3  . diazepam (VALIUM) 5 MG tablet    Sig: Take 1 tablet (5 mg total) by mouth every 6 (six) hours as needed. for anxiety    Dispense:  30 tablet    Refill:  3  . metoprolol succinate (TOPROL-XL) 25 MG 24 hr tablet    Sig: Take 1 tablet (25 mg total) by mouth daily.    Dispense:  90 tablet    Refill:  3  . nitroGLYCERIN (NITROSTAT) 0.4 MG SL tablet    Sig: Place 1 tablet (0.4 mg total) under the tongue every 5 (five) minutes as needed for chest pain.    Dispense:  20 tablet    Refill:  1  . esomeprazole (NEXIUM) 20 MG packet    Sig: Take 20 mg by mouth daily before breakfast.    Dispense:  90 each    Refill:  3     Follow-up: Return in about 1 week (around 03/08/2015) for a follow-up visit.  Walker Kehr, MD

## 2015-03-01 NOTE — Patient Instructions (Addendum)
Go to ER if worse  Preventive Care for Adults, Female A healthy lifestyle and preventive care can promote health and wellness. Preventive health guidelines for women include the following key practices.  A routine yearly physical is a good way to check with your health care provider about your health and preventive screening. It is a chance to share any concerns and updates on your health and to receive a thorough exam.  Visit your dentist for a routine exam and preventive care every 6 months. Brush your teeth twice a day and floss once a day. Good oral hygiene prevents tooth decay and gum disease.  The frequency of eye exams is based on your age, health, family medical history, use of contact lenses, and other factors. Follow your health care provider's recommendations for frequency of eye exams.  Eat a healthy diet. Foods like vegetables, fruits, whole grains, low-fat dairy products, and lean protein foods contain the nutrients you need without too many calories. Decrease your intake of foods high in solid fats, added sugars, and salt. Eat the right amount of calories for you.Get information about a proper diet from your health care provider, if necessary.  Regular physical exercise is one of the most important things you can do for your health. Most adults should get at least 150 minutes of moderate-intensity exercise (any activity that increases your heart rate and causes you to sweat) each week. In addition, most adults need muscle-strengthening exercises on 2 or more days a week.  Maintain a healthy weight. The body mass index (BMI) is a screening tool to identify possible weight problems. It provides an estimate of body fat based on height and weight. Your health care provider can find your BMI and can help you achieve or maintain a healthy weight.For adults 20 years and older:  A BMI below 18.5 is considered underweight.  A BMI of 18.5 to 24.9 is normal.  A BMI of 25 to 29.9 is  considered overweight.  A BMI of 30 and above is considered obese.  Maintain normal blood lipids and cholesterol levels by exercising and minimizing your intake of saturated fat. Eat a balanced diet with plenty of fruit and vegetables. Blood tests for lipids and cholesterol should begin at age 79 and be repeated every 5 years. If your lipid or cholesterol levels are high, you are over 50, or you are at high risk for heart disease, you may need your cholesterol levels checked more frequently.Ongoing high lipid and cholesterol levels should be treated with medicines if diet and exercise are not working.  If you smoke, find out from your health care provider how to quit. If you do not use tobacco, do not start.  Lung cancer screening is recommended for adults aged 59-80 years who are at high risk for developing lung cancer because of a history of smoking. A yearly low-dose CT scan of the lungs is recommended for people who have at least a 30-pack-year history of smoking and are a current smoker or have quit within the past 15 years. A pack year of smoking is smoking an average of 1 pack of cigarettes a day for 1 year (for example: 1 pack a day for 30 years or 2 packs a day for 15 years). Yearly screening should continue until the smoker has stopped smoking for at least 15 years. Yearly screening should be stopped for people who develop a health problem that would prevent them from having lung cancer treatment.  If you are pregnant, do  not drink alcohol. If you are breastfeeding, be very cautious about drinking alcohol. If you are not pregnant and choose to drink alcohol, do not have more than 1 drink per day. One drink is considered to be 12 ounces (355 mL) of beer, 5 ounces (148 mL) of wine, or 1.5 ounces (44 mL) of liquor.  Avoid use of street drugs. Do not share needles with anyone. Ask for help if you need support or instructions about stopping the use of drugs.  High blood pressure causes heart  disease and increases the risk of stroke. Your blood pressure should be checked at least every 1 to 2 years. Ongoing high blood pressure should be treated with medicines if weight loss and exercise do not work.  If you are 1-39 years old, ask your health care provider if you should take aspirin to prevent strokes.  Diabetes screening is done by taking a blood sample to check your blood glucose level after you have not eaten for a certain period of time (fasting). If you are not overweight and you do not have risk factors for diabetes, you should be screened once every 3 years starting at age 65. If you are overweight or obese and you are 39-59 years of age, you should be screened for diabetes every year as part of your cardiovascular risk assessment.  Breast cancer screening is essential preventive care for women. You should practice "breast self-awareness." This means understanding the normal appearance and feel of your breasts and may include breast self-examination. Any changes detected, no matter how small, should be reported to a health care provider. Women in their 81s and 30s should have a clinical breast exam (CBE) by a health care provider as part of a regular health exam every 1 to 3 years. After age 41, women should have a CBE every year. Starting at age 23, women should consider having a mammogram (breast X-ray test) every year. Women who have a family history of breast cancer should talk to their health care provider about genetic screening. Women at a high risk of breast cancer should talk to their health care providers about having an MRI and a mammogram every year.  Breast cancer gene (BRCA)-related cancer risk assessment is recommended for women who have family members with BRCA-related cancers. BRCA-related cancers include breast, ovarian, tubal, and peritoneal cancers. Having family members with these cancers may be associated with an increased risk for harmful changes (mutations) in the  breast cancer genes BRCA1 and BRCA2. Results of the assessment will determine the need for genetic counseling and BRCA1 and BRCA2 testing.  Your health care provider may recommend that you be screened regularly for cancer of the pelvic organs (ovaries, uterus, and vagina). This screening involves a pelvic examination, including checking for microscopic changes to the surface of your cervix (Pap test). You may be encouraged to have this screening done every 3 years, beginning at age 63.  For women ages 10-65, health care providers may recommend pelvic exams and Pap testing every 3 years, or they may recommend the Pap and pelvic exam, combined with testing for human papilloma virus (HPV), every 5 years. Some types of HPV increase your risk of cervical cancer. Testing for HPV may also be done on women of any age with unclear Pap test results.  Other health care providers may not recommend any screening for nonpregnant women who are considered low risk for pelvic cancer and who do not have symptoms. Ask your health care provider if a  screening pelvic exam is right for you.  If you have had past treatment for cervical cancer or a condition that could lead to cancer, you need Pap tests and screening for cancer for at least 20 years after your treatment. If Pap tests have been discontinued, your risk factors (such as having a new sexual partner) need to be reassessed to determine if screening should resume. Some women have medical problems that increase the chance of getting cervical cancer. In these cases, your health care provider may recommend more frequent screening and Pap tests.  Colorectal cancer can be detected and often prevented. Most routine colorectal cancer screening begins at the age of 58 years and continues through age 32 years. However, your health care provider may recommend screening at an earlier age if you have risk factors for colon cancer. On a yearly basis, your health care provider may  provide home test kits to check for hidden blood in the stool. Use of a small camera at the end of a tube, to directly examine the colon (sigmoidoscopy or colonoscopy), can detect the earliest forms of colorectal cancer. Talk to your health care provider about this at age 51, when routine screening begins. Direct exam of the colon should be repeated every 5-10 years through age 42 years, unless early forms of precancerous polyps or small growths are found.  People who are at an increased risk for hepatitis B should be screened for this virus. You are considered at high risk for hepatitis B if:  You were born in a country where hepatitis B occurs often. Talk with your health care provider about which countries are considered high risk.  Your parents were born in a high-risk country and you have not received a shot to protect against hepatitis B (hepatitis B vaccine).  You have HIV or AIDS.  You use needles to inject street drugs.  You live with, or have sex with, someone who has hepatitis B.  You get hemodialysis treatment.  You take certain medicines for conditions like cancer, organ transplantation, and autoimmune conditions.  Hepatitis C blood testing is recommended for all people born from [birth year] through 1965 and any individual with known risks for hepatitis C.  Practice safe sex. Use condoms and avoid high-risk sexual practices to reduce the spread of sexually transmitted infections (STIs). STIs include gonorrhea, chlamydia, syphilis, trichomonas, herpes, HPV, and human immunodeficiency virus (HIV). Herpes, HIV, and HPV are viral illnesses that have no cure. They can result in disability, cancer, and death.  You should be screened for sexually transmitted illnesses (STIs) including gonorrhea and chlamydia if:  You are sexually active and are younger than 24 years.  You are older than 24 years and your health care provider tells you that you are at risk for this type of infection.  Your  sexual activity has changed since you were last screened and you are at an increased risk for chlamydia or gonorrhea. Ask your health care provider if you are at risk.  If you are at risk of being infected with HIV, it is recommended that you take a prescription medicine daily to prevent HIV infection. This is called preexposure prophylaxis (PrEP). You are considered at risk if:  You are sexually active and do not regularly use condoms or know the HIV status of your partner(s).  You take drugs by injection.  You are sexually active with a partner who has HIV.  Talk with your health care provider about whether you are at high risk  of being infected with HIV. If you choose to begin PrEP, you should first be tested for HIV. You should then be tested every 3 months for as long as you are taking PrEP.  Osteoporosis is a disease in which the bones lose minerals and strength with aging. This can result in serious bone fractures or breaks. The risk of osteoporosis can be identified using a bone density scan. Women ages 68 years and over and women at risk for fractures or osteoporosis should discuss screening with their health care providers. Ask your health care provider whether you should take a calcium supplement or vitamin D to reduce the rate of osteoporosis.  Menopause can be associated with physical symptoms and risks. Hormone replacement therapy is available to decrease symptoms and risks. You should talk to your health care provider about whether hormone replacement therapy is right for you.  Use sunscreen. Apply sunscreen liberally and repeatedly throughout the day. You should seek shade when your shadow is shorter than you. Protect yourself by wearing long sleeves, pants, a wide-brimmed hat, and sunglasses year round, whenever you are outdoors.  Once a month, do a whole body skin exam, using a mirror to look at the skin on your back. Tell your health care provider of new moles, moles that have  irregular borders, moles that are larger than a pencil eraser, or moles that have changed in shape or color.  Stay current with required vaccines (immunizations).  Influenza vaccine. All adults should be immunized every year.  Tetanus, diphtheria, and acellular pertussis (Td, Tdap) vaccine. Pregnant women should receive 1 dose of Tdap vaccine during each pregnancy. The dose should be obtained regardless of the length of time since the last dose. Immunization is preferred during the 27th-36th week of gestation. An adult who has not previously received Tdap or who does not know her vaccine status should receive 1 dose of Tdap. This initial dose should be followed by tetanus and diphtheria toxoids (Td) booster doses every 10 years. Adults with an unknown or incomplete history of completing a 3-dose immunization series with Td-containing vaccines should begin or complete a primary immunization series including a Tdap dose. Adults should receive a Td booster every 10 years.  Varicella vaccine. An adult without evidence of immunity to varicella should receive 2 doses or a second dose if she has previously received 1 dose. Pregnant females who do not have evidence of immunity should receive the first dose after pregnancy. This first dose should be obtained before leaving the health care facility. The second dose should be obtained 4-8 weeks after the first dose.  Human papillomavirus (HPV) vaccine. Females aged 13-26 years who have not received the vaccine previously should obtain the 3-dose series. The vaccine is not recommended for use in pregnant females. However, pregnancy testing is not needed before receiving a dose. If a female is found to be pregnant after receiving a dose, no treatment is needed. In that case, the remaining doses should be delayed until after the pregnancy. Immunization is recommended for any person with an immunocompromised condition through the age of 52 years if she did not get any or  all doses earlier. During the 3-dose series, the second dose should be obtained 4-8 weeks after the first dose. The third dose should be obtained 24 weeks after the first dose and 16 weeks after the second dose.  Zoster vaccine. One dose is recommended for adults aged 95 years or older unless certain conditions are present.  Measles, mumps,  and rubella (MMR) vaccine. Adults born before [birth year] generally are considered immune to measles and mumps. Adults born in [birth year] or later should have 1 or more doses of MMR vaccine unless there is a contraindication to the vaccine or there is laboratory evidence of immunity to each of the three diseases. A routine second dose of MMR vaccine should be obtained at least 28 days after the first dose for students attending postsecondary schools, health care workers, or international travelers. People who received inactivated measles vaccine or an unknown type of measles vaccine during 1963-1967 should receive 2 doses of MMR vaccine. People who received inactivated mumps vaccine or an unknown type of mumps vaccine before 1979 and are at high risk for mumps infection should consider immunization with 2 doses of MMR vaccine. For females of childbearing age, rubella immunity should be determined. If there is no evidence of immunity, females who are not pregnant should be vaccinated. If there is no evidence of immunity, females who are pregnant should delay immunization until after pregnancy. Unvaccinated health care workers born before [birth year] who lack laboratory evidence of measles, mumps, or rubella immunity or laboratory confirmation of disease should consider measles and mumps immunization with 2 doses of MMR vaccine or rubella immunization with 1 dose of MMR vaccine.  Pneumococcal 13-valent conjugate (PCV13) vaccine. When indicated, a person who is uncertain of his immunization history and has no record of immunization should receive the PCV13 vaccine. All adults 50 years of age and  older should receive this vaccine. An adult aged 41 years or older who has certain medical conditions and has not been previously immunized should receive 1 dose of PCV13 vaccine. This PCV13 should be followed with a dose of pneumococcal polysaccharide (PPSV23) vaccine. Adults who are at high risk for pneumococcal disease should obtain the PPSV23 vaccine at least 8 weeks after the dose of PCV13 vaccine. Adults older than 79 years of age who have normal immune system function should obtain the PPSV23 vaccine dose at least 1 year after the dose of PCV13 vaccine.  Pneumococcal polysaccharide (PPSV23) vaccine. When PCV13 is also indicated, PCV13 should be obtained first. All adults aged 26 years and older should be immunized. An adult younger than age 15 years who has certain medical conditions should be immunized. Any person who resides in a nursing home or long-term care facility should be immunized. An adult smoker should be immunized. People with an immunocompromised condition and certain other conditions should receive both PCV13 and PPSV23 vaccines. People with human immunodeficiency virus (HIV) infection should be immunized as soon as possible after diagnosis. Immunization during chemotherapy or radiation therapy should be avoided. Routine use of PPSV23 vaccine is not recommended for American Indians, [place] Natives, or people younger than 65 years unless there are medical conditions that require PPSV23 vaccine. When indicated, people who have unknown immunization and have no record of immunization should receive PPSV23 vaccine. One-time revaccination 5 years after the first dose of PPSV23 is recommended for people aged 19-64 years who have chronic kidney failure, nephrotic syndrome, asplenia, or immunocompromised conditions. People who received 1-2 doses of PPSV23 before age 54 years should receive another dose of PPSV23 vaccine at age 71 years or later if at least 5 years have passed since the previous dose.  Doses of PPSV23 are not needed for people immunized with PPSV23 at or after age 25 years.  Meningococcal vaccine. Adults with asplenia or persistent complement component deficiencies should receive 2 doses of quadrivalent meningococcal conjugate (MenACWY-D)  vaccine. The doses should be obtained at least 2 months apart. Microbiologists working with certain meningococcal bacteria, [employer], people at risk during an outbreak, and people who travel to or live in countries with a high rate of meningitis should be immunized. A first-year college student up through age 46 years who is living in a residence hall should receive a dose if she did not receive a dose on or after her 16th birthday. Adults who have certain high-risk conditions should receive one or more doses of vaccine.  Hepatitis A vaccine. Adults who wish to be protected from this disease, have certain high-risk conditions, work with hepatitis A-infected animals, work in hepatitis A research labs, or travel to or work in countries with a high rate of hepatitis A should be immunized. Adults who were previously unvaccinated and who anticipate close contact with an international adoptee during the first 60 days after arrival in the [place] from a country with a high rate of hepatitis A should be immunized.  Hepatitis B vaccine. Adults who wish to be protected from this disease, have certain high-risk conditions, may be exposed to blood or other infectious body fluids, are household contacts or sex partners of hepatitis B positive people, are clients or workers in certain care facilities, or travel to or work in countries with a high rate of hepatitis B should be immunized.  Haemophilus influenzae type b (Hib) vaccine. A previously unvaccinated person with asplenia or sickle cell disease or having a scheduled splenectomy should receive 1 dose of Hib vaccine. Regardless of previous immunization, a recipient of a hematopoietic stem cell  transplant should receive a 3-dose series 6-12 months after her successful transplant. Hib vaccine is not recommended for adults with HIV infection. Preventive Services / Frequency Ages 63 to 71 years  Blood pressure check.** / Every 3-5 years.  Lipid and cholesterol check.** / Every 5 years beginning at age 68.  Clinical breast exam.** / Every 3 years for women in their 42s and 39s.  BRCA-related cancer risk assessment.** / For women who have family members with a BRCA-related cancer (breast, ovarian, tubal, or peritoneal cancers).  Pap test.** / Every 2 years from ages 63 through 16. Every 3 years starting at age 64 through age 68 or 27 with a history of 3 consecutive normal Pap tests.  HPV screening.** / Every 3 years from ages 64 through ages 52 to 58 with a history of 3 consecutive normal Pap tests.  Hepatitis C blood test.** / For any individual with known risks for hepatitis C.  Skin self-exam. / Monthly.  Influenza vaccine. / Every year.  Tetanus, diphtheria, and acellular pertussis (Tdap, Td) vaccine.** / Consult your health care provider. Pregnant women should receive 1 dose of Tdap vaccine during each pregnancy. 1 dose of Td every 10 years.  Varicella vaccine.** / Consult your health care provider. Pregnant females who do not have evidence of immunity should receive the first dose after pregnancy.  HPV vaccine. / 3 doses over 6 months, if 74 and younger. The vaccine is not recommended for use in pregnant females. However, pregnancy testing is not needed before receiving a dose.  Measles, mumps, rubella (MMR) vaccine.** / You need at least 1 dose of MMR if you were born in 1957 or later. You may also need a 2nd dose. For females of childbearing age, rubella immunity should be determined. If there is no evidence of immunity, females who are not pregnant should be vaccinated. If there is no  evidence of immunity, females who are pregnant should delay immunization until after  pregnancy.  Pneumococcal 13-valent conjugate (PCV13) vaccine.** / Consult your health care provider.  Pneumococcal polysaccharide (PPSV23) vaccine.** / 1 to 2 doses if you smoke cigarettes or if you have certain conditions.  Meningococcal vaccine.** / 1 dose if you are age 14 to 16 years and a Market researcher living in a residence hall, or have one of several medical conditions, you need to get vaccinated against meningococcal disease. You may also need additional booster doses.  Hepatitis A vaccine.** / Consult your health care provider.  Hepatitis B vaccine.** / Consult your health care provider.  Haemophilus influenzae type b (Hib) vaccine.** / Consult your health care provider. Ages 9 to 66 years  Blood pressure check.** / Every year.  Lipid and cholesterol check.** / Every 5 years beginning at age 34 years.  Lung cancer screening. / Every year if you are aged 26-80 years and have a 30-pack-year history of smoking and currently smoke or have quit within the past 15 years. Yearly screening is stopped once you have quit smoking for at least 15 years or develop a health problem that would prevent you from having lung cancer treatment.  Clinical breast exam.** / Every year after age 33 years.  BRCA-related cancer risk assessment.** / For women who have family members with a BRCA-related cancer (breast, ovarian, tubal, or peritoneal cancers).  Mammogram.** / Every year beginning at age 36 years and continuing for as long as you are in good health. Consult with your health care provider.  Pap test.** / Every 3 years starting at age 23 years through age 61 or 92 years with a history of 3 consecutive normal Pap tests.  HPV screening.** / Every 3 years from ages 75 years through ages 76 to 49 years with a history of 3 consecutive normal Pap tests.  Fecal occult blood test (FOBT) of stool. / Every year beginning at age 67 years and continuing until age 75 years. You may not need  to do this test if you get a colonoscopy every 10 years.  Flexible sigmoidoscopy or colonoscopy.** / Every 5 years for a flexible sigmoidoscopy or every 10 years for a colonoscopy beginning at age 34 years and continuing until age 62 years.  Hepatitis C blood test.** / For all people born from [birth year] through 1963 and any individual with known risks for hepatitis C.  Skin self-exam. / Monthly.  Influenza vaccine. / Every year.  Tetanus, diphtheria, and acellular pertussis (Tdap/Td) vaccine.** / Consult your health care provider. Pregnant women should receive 1 dose of Tdap vaccine during each pregnancy. 1 dose of Td every 10 years.  Varicella vaccine.** / Consult your health care provider. Pregnant females who do not have evidence of immunity should receive the first dose after pregnancy.  Zoster vaccine.** / 1 dose for adults aged 44 years or older.  Measles, mumps, rubella (MMR) vaccine.** / You need at least 1 dose of MMR if you were born in 1957 or later. You may also need a second dose. For females of childbearing age, rubella immunity should be determined. If there is no evidence of immunity, females who are not pregnant should be vaccinated. If there is no evidence of immunity, females who are pregnant should delay immunization until after pregnancy.  Pneumococcal 13-valent conjugate (PCV13) vaccine.** / Consult your health care provider.  Pneumococcal polysaccharide (PPSV23) vaccine.** / 1 to 2 doses if you smoke cigarettes or if you have  certain conditions.  Meningococcal vaccine.** / Consult your health care provider.  Hepatitis A vaccine.** / Consult your health care provider.  Hepatitis B vaccine.** / Consult your health care provider.  Haemophilus influenzae type b (Hib) vaccine.** / Consult your health care provider. Ages 54 years and over  Blood pressure check.** / Every year.  Lipid and cholesterol check.** / Every 5 years beginning at age 20 years.  Lung cancer  screening. / Every year if you are aged 37-80 years and have a 30-pack-year history of smoking and currently smoke or have quit within the past 15 years. Yearly screening is stopped once you have quit smoking for at least 15 years or develop a health problem that would prevent you from having lung cancer treatment.  Clinical breast exam.** / Every year after age 83 years.  BRCA-related cancer risk assessment.** / For women who have family members with a BRCA-related cancer (breast, ovarian, tubal, or peritoneal cancers).  Mammogram.** / Every year beginning at age 44 years and continuing for as long as you are in good health. Consult with your health care provider.  Pap test.** / Every 3 years starting at age 47 years through age 39 or 42 years with 3 consecutive normal Pap tests. Testing can be stopped between 65 and 70 years with 3 consecutive normal Pap tests and no abnormal Pap or HPV tests in the past 10 years.  HPV screening.** / Every 3 years from ages 54 years through ages 71 or 35 years with a history of 3 consecutive normal Pap tests. Testing can be stopped between 65 and 70 years with 3 consecutive normal Pap tests and no abnormal Pap or HPV tests in the past 10 years.  Fecal occult blood test (FOBT) of stool. / Every year beginning at age 51 years and continuing until age 65 years. You may not need to do this test if you get a colonoscopy every 10 years.  Flexible sigmoidoscopy or colonoscopy.** / Every 5 years for a flexible sigmoidoscopy or every 10 years for a colonoscopy beginning at age 20 years and continuing until age 89 years.  Hepatitis C blood test.** / For all people born from [birth year] through 1963 and any individual with known risks for hepatitis C.  Osteoporosis screening.** / A one-time screening for women ages 58 years and over and women at risk for fractures or osteoporosis.  Skin self-exam. / Monthly.  Influenza vaccine. / Every year.  Tetanus, diphtheria, and  acellular pertussis (Tdap/Td) vaccine.** / 1 dose of Td every 10 years.  Varicella vaccine.** / Consult your health care provider.  Zoster vaccine.** / 1 dose for adults aged 46 years or older.  Pneumococcal 13-valent conjugate (PCV13) vaccine.** / Consult your health care provider.  Pneumococcal polysaccharide (PPSV23) vaccine.** / 1 dose for all adults aged 76 years and older.  Meningococcal vaccine.** / Consult your health care provider.  Hepatitis A vaccine.** / Consult your health care provider.  Hepatitis B vaccine.** / Consult your health care provider.  Haemophilus influenzae type b (Hib) vaccine.** / Consult your health care provider. ** Family history and personal history of risk and conditions may change your health care provider's recommendations.   This information is not intended to replace advice given to you by your health care provider. Make sure you discuss any questions you have with your health care provider.   Document Released: 04/02/2001 Document Revised: 02/25/2014 Document Reviewed: 07/02/2010 Elsevier Interactive Patient Education [employer].

## 2015-03-01 NOTE — Progress Notes (Signed)
Pre visit review using our clinic review tool, if applicable. No additional management support is needed unless otherwise documented below in the visit note. 

## 2015-03-01 NOTE — Assessment & Plan Note (Signed)
Here for medicare wellness/physical  Diet: heart healthy  Physical activity: not sedentary  Depression/mood screen: anxiety Hearing: intact to whispered voice  Visual acuity: grossly normal, performs annual eye exam  ADLs: capable  Fall risk: low to none  Home safety: good  Cognitive evaluation: intact to orientation, naming, recall and repetition  EOL planning: adv directives, full code/ I agree  I have personally reviewed and have noted  1. The patient's medical, surgical and social history  2. Their use of alcohol, tobacco or illicit drugs  3. Their current medications and supplements  4. The patient's functional ability including ADL's, fall risks, home safety risks and hearing or visual impairment.  5. Diet and physical activities  6. Evidence for depression or mood disorders 7. The roster of all physicians providing medical care to patient - is listed in the Snapshot section of the chart and reviewed today.    Today patient counseled on age appropriate routine health concerns for screening and prevention, each reviewed and up to date or declined. Immunizations reviewed and up to date or declined. Labs ordered and reviewed. Risk factors for depression reviewed and negative. Hearing function and visual acuity are intact. ADLs screened and addressed as needed. Functional ability and level of safety reviewed and appropriate. Education, counseling and referrals performed based on assessed risks today. Patient provided with a copy of personalized plan for preventive services.

## 2015-03-01 NOTE — Assessment & Plan Note (Signed)
No wheezing CXR

## 2015-03-09 ENCOUNTER — Encounter: Payer: Self-pay | Admitting: Internal Medicine

## 2015-03-09 ENCOUNTER — Ambulatory Visit (INDEPENDENT_AMBULATORY_CARE_PROVIDER_SITE_OTHER): Payer: Medicare Other | Admitting: Internal Medicine

## 2015-03-09 VITALS — BP 124/90 | HR 60 | Wt 131.0 lb

## 2015-03-09 DIAGNOSIS — R0789 Other chest pain: Secondary | ICD-10-CM

## 2015-03-09 DIAGNOSIS — K219 Gastro-esophageal reflux disease without esophagitis: Secondary | ICD-10-CM | POA: Diagnosis not present

## 2015-03-09 DIAGNOSIS — I10 Essential (primary) hypertension: Secondary | ICD-10-CM | POA: Diagnosis not present

## 2015-03-09 DIAGNOSIS — F419 Anxiety disorder, unspecified: Secondary | ICD-10-CM | POA: Insufficient documentation

## 2015-03-09 DIAGNOSIS — F418 Other specified anxiety disorders: Secondary | ICD-10-CM

## 2015-03-09 DIAGNOSIS — F411 Generalized anxiety disorder: Secondary | ICD-10-CM

## 2015-03-09 MED ORDER — HYDROCHLOROTHIAZIDE 25 MG PO TABS
25.0000 mg | ORAL_TABLET | Freq: Every day | ORAL | Status: DC
Start: 2015-03-09 — End: 2016-05-21

## 2015-03-09 MED ORDER — FLUOXETINE HCL 10 MG PO TABS: 10.0000 mg | ORAL_TABLET | Freq: Every day | ORAL | Status: DC

## 2015-03-09 MED ORDER — PANTOPRAZOLE SODIUM 40 MG PO TBEC: 40.0000 mg | DELAYED_RELEASE_TABLET | Freq: Every day | ORAL | Status: DC

## 2015-03-09 MED ORDER — ATORVASTATIN CALCIUM 20 MG PO TABS: 20.0000 mg | ORAL_TABLET | Freq: Every day | ORAL | Status: DC

## 2015-03-09 NOTE — Progress Notes (Signed)
Pre visit review using our clinic review tool, if applicable. No additional management support is needed unless otherwise documented below in the visit note. 

## 2015-03-09 NOTE — Assessment & Plan Note (Signed)
CP resolved w/GERD treatment CL stress test is pending

## 2015-03-09 NOTE — Assessment & Plan Note (Signed)
Benazepril, HCTZ 1/17 added Toprol

## 2015-03-09 NOTE — Assessment & Plan Note (Signed)
1/17 start Prozac

## 2015-03-09 NOTE — Assessment & Plan Note (Signed)
1/17 start Protonix

## 2015-03-09 NOTE — Progress Notes (Signed)
Subjective:  Patient ID: Whitney Benson, female    DOB: 1934/09/05  Age: 81 y.o. MRN: [id number]  CC: No chief complaint on file.   HPI RANYAH BUSSE presents for elev BP (SBP 107-166). F/u CP, GERD - resolved  Outpatient Prescriptions Prior to Visit  Medication Sig Dispense Refill  . aspirin 81 MG tablet Take 81 mg by mouth daily.      Marland Kitchen atorvastatin (LIPITOR) 20 MG tablet TAKE 1 TABLET BY MOUTH DAILY 90 tablet 1  . benazepril (LOTENSIN) 40 MG tablet TAKE 1 TABLET BY MOUTH ONCE DAILY 90 tablet 3  . diazepam (VALIUM) 5 MG tablet Take 1 tablet (5 mg total) by mouth every 6 (six) hours as needed. for anxiety 30 tablet 3  . esomeprazole (NEXIUM) 20 MG packet Take 20 mg by mouth daily before breakfast. 90 each 3  . hydrochlorothiazide (HYDRODIURIL) 25 MG tablet TAKE 1 TABLET BY MOUTH ONCE DAILY 90 tablet 3  . metoprolol succinate (TOPROL-XL) 25 MG 24 hr tablet Take 1 tablet (25 mg total) by mouth daily. 90 tablet 3  . MULTIPLE VITAMIN PO Take by mouth.      . nitroGLYCERIN (NITROSTAT) 0.4 MG SL tablet Place 1 tablet (0.4 mg total) under the tongue every 5 (five) minutes as needed for chest pain. 20 tablet 1   No facility-administered medications prior to visit.    ROS Review of Systems  Constitutional: Negative for chills, activity change, appetite change, fatigue and unexpected weight change.  HENT: Negative for congestion, mouth sores and sinus pressure.   Eyes: Negative for visual disturbance.  Respiratory: Negative for cough and chest tightness.   Cardiovascular: Negative for chest pain.  Gastrointestinal: Negative for nausea, vomiting, abdominal pain and abdominal distention.  Genitourinary: Negative for frequency, difficulty urinating and vaginal pain.  Musculoskeletal: Negative for back pain and gait problem.  Skin: Negative for pallor and rash.  Neurological: Negative for dizziness, tremors, weakness, numbness and headaches.  Psychiatric/Behavioral: Negative for confusion and  sleep disturbance.    Objective:  BP 124/90 mmHg  Pulse 60  Wt 131 lb (59.421 kg)  SpO2 97%  BP Readings from Last 3 Encounters:  03/09/15 124/90  03/01/15 160/90  01/03/15 132/84    Wt Readings from Last 3 Encounters:  03/09/15 131 lb (59.421 kg)  03/01/15 130 lb (58.968 kg)  01/03/15 126 lb (57.153 kg)    Physical Exam  Constitutional: She appears well-developed. No distress.  HENT:  Head: Normocephalic.  Right Ear: External ear normal.  Left Ear: External ear normal.  Nose: Nose normal.  Mouth/Throat: Oropharynx is clear and moist.  Eyes: Conjunctivae are normal. Pupils are equal, round, and reactive to light. Right eye exhibits no discharge. Left eye exhibits no discharge.  Neck: Normal range of motion. Neck supple. No JVD present. No tracheal deviation present. No thyromegaly present.  Cardiovascular: Normal rate, regular rhythm and normal heart sounds.   Pulmonary/Chest: No stridor. No respiratory distress. She has no wheezes.  Abdominal: Soft. Bowel sounds are normal. She exhibits no distension and no mass. There is no tenderness. There is no rebound and no guarding.  Musculoskeletal: She exhibits no edema or tenderness.  Lymphadenopathy:    She has no cervical adenopathy.  Neurological: She displays normal reflexes. No cranial nerve deficit. She exhibits normal muscle tone. Coordination normal.  Skin: No rash noted. No erythema.  Psychiatric: She has a normal mood and affect. Her behavior is normal. Judgment and thought content normal.    Lab  Results  Component Value Date   WBC 5.6 03/01/2015   HGB 13.1 03/01/2015   HCT 39.8 03/01/2015   PLT 248.0 03/01/2015   GLUCOSE 102* 03/01/2015   CHOL 165 03/01/2015   TRIG 141.0 03/01/2015   HDL 61.50 03/01/2015   LDLDIRECT 84.1 07/20/2012   LDLCALC 75 03/01/2015   ALT 20 03/01/2015   AST 22 03/01/2015   NA 141 03/01/2015   K 3.8 03/01/2015   CL 102 03/01/2015   CREATININE 0.78 03/01/2015   BUN 16 03/01/2015     CO2 31 03/01/2015   TSH 1.02 03/01/2015    Dg Chest 2 View  03/01/2015  CLINICAL DATA:  Chest pain, unspecified EXAM: CHEST  2 VIEW COMPARISON:  12/16/2013 FINDINGS: Minimal atelectasis or scar at the bases. There is no edema, consolidation, effusion, or pneumothorax. Normal heart size and aortic contours. No osseous findings to explain acute chest pain. IMPRESSION: No evidence for active cardiopulmonary disease. Electronically Signed   By: Monte Fantasia M.D.   On: 03/01/2015 12:50    Assessment & Plan:   There are no diagnoses linked to this encounter. I am having Ms. Nikolai maintain her aspirin, MULTIPLE VITAMIN PO, atorvastatin, hydrochlorothiazide, benazepril, diazepam, metoprolol succinate, nitroGLYCERIN, and esomeprazole.  No orders of the defined types were placed in this encounter.     Follow-up: No Follow-up on file.  Walker Kehr, MD

## 2015-06-14 ENCOUNTER — Encounter: Payer: Self-pay | Admitting: Internal Medicine

## 2016-01-15 DIAGNOSIS — Z6824 Body mass index (BMI) 24.0-24.9, adult: Secondary | ICD-10-CM | POA: Diagnosis not present

## 2016-01-15 DIAGNOSIS — Z124 Encounter for screening for malignant neoplasm of cervix: Secondary | ICD-10-CM | POA: Diagnosis not present

## 2016-01-15 DIAGNOSIS — Z1231 Encounter for screening mammogram for malignant neoplasm of breast: Secondary | ICD-10-CM | POA: Diagnosis not present

## 2016-01-15 DIAGNOSIS — Z01419 Encounter for gynecological examination (general) (routine) without abnormal findings: Secondary | ICD-10-CM | POA: Diagnosis not present

## 2016-03-23 ENCOUNTER — Other Ambulatory Visit: Payer: Self-pay | Admitting: Internal Medicine

## 2016-04-09 ENCOUNTER — Other Ambulatory Visit: Payer: Self-pay

## 2016-04-09 MED ORDER — PANTOPRAZOLE SODIUM 40 MG PO TBEC: 40.0000 mg | DELAYED_RELEASE_TABLET | Freq: Every day | ORAL | 0 refills | Status: DC

## 2016-04-09 NOTE — Telephone Encounter (Signed)
Patient needs OV for further refills 

## 2016-04-17 ENCOUNTER — Other Ambulatory Visit: Payer: Self-pay | Admitting: Internal Medicine

## 2016-04-26 ENCOUNTER — Other Ambulatory Visit: Payer: Self-pay | Admitting: Internal Medicine

## 2016-04-30 DIAGNOSIS — H5213 Myopia, bilateral: Secondary | ICD-10-CM | POA: Diagnosis not present

## 2016-05-07 ENCOUNTER — Other Ambulatory Visit: Payer: Self-pay | Admitting: *Deleted

## 2016-05-20 ENCOUNTER — Telehealth: Payer: Self-pay | Admitting: Internal Medicine

## 2016-05-20 NOTE — Telephone Encounter (Signed)
Pt husband called in and said that pt is feeling really bad and last time she had this chest cold it turned into pneumonia . He would like to know if she could be worked it.  He worried that she will end up in hospital again if not treated soon

## 2016-05-21 ENCOUNTER — Encounter: Payer: Self-pay | Admitting: Internal Medicine

## 2016-05-21 ENCOUNTER — Ambulatory Visit (INDEPENDENT_AMBULATORY_CARE_PROVIDER_SITE_OTHER): Payer: Medicare Other | Admitting: Internal Medicine

## 2016-05-21 VITALS — BP 170/92 | HR 70 | Temp 98.0°F | Resp 16 | Wt 128.0 lb

## 2016-05-21 DIAGNOSIS — J22 Unspecified acute lower respiratory infection: Secondary | ICD-10-CM | POA: Diagnosis not present

## 2016-05-21 MED ORDER — BENAZEPRIL HCL 40 MG PO TABS: 40.0000 mg | ORAL_TABLET | Freq: Every day | ORAL | 0 refills | Status: DC

## 2016-05-21 MED ORDER — METOPROLOL SUCCINATE ER 25 MG PO TB24: 25.0000 mg | ORAL_TABLET | Freq: Every day | ORAL | 0 refills | Status: DC

## 2016-05-21 MED ORDER — FLUOXETINE HCL 10 MG PO TABS: 10.0000 mg | ORAL_TABLET | Freq: Every day | ORAL | 6 refills | Status: DC

## 2016-05-21 MED ORDER — ATORVASTATIN CALCIUM 20 MG PO TABS: 20.0000 mg | ORAL_TABLET | Freq: Every day | ORAL | 0 refills | Status: DC

## 2016-05-21 MED ORDER — HYDROCHLOROTHIAZIDE 25 MG PO TABS: 25.0000 mg | ORAL_TABLET | Freq: Every day | ORAL | 0 refills | Status: DC

## 2016-05-21 MED ORDER — CEFDINIR 300 MG PO CAPS: 300.0000 mg | ORAL_CAPSULE | Freq: Two times a day (BID) | ORAL | 0 refills | Status: DC

## 2016-05-21 MED ORDER — PANTOPRAZOLE SODIUM 40 MG PO TBEC: 40.0000 mg | DELAYED_RELEASE_TABLET | Freq: Every day | ORAL | 0 refills | Status: DC

## 2016-05-21 MED ORDER — HYDROCODONE-HOMATROPINE 5-1.5 MG/5ML PO SYRP: 5.0000 mL | ORAL_SOLUTION | Freq: Three times a day (TID) | ORAL | 0 refills | Status: DC | PRN

## 2016-05-21 NOTE — Telephone Encounter (Signed)
Patient has an appointment with Dr. Quay Burow today.   Also patient has a cpe next week. Can she do her labs before. When she comes in for her appointment with Burns she would like to her labs. Also patient is out of medications, can we send in a weeks rx refill until her appointment next tues. Thank you.

## 2016-05-21 NOTE — Progress Notes (Signed)
Subjective:    Patient ID: [name], female    DOB: [birth year], 82 y.o.   MRN: [id number]  HPI She is here for an acute visit for cold symptoms.   Her symptoms started two days ago.  She is experiencing shoulder achiness, cough productive of some yellow phlegm, sore throat, fatigue, hoarseness and headaches.    She has tried taking some of her husbands cough medication at night and aleve with some improvement in symptoms.   Medications and allergies reviewed with patient and updated if appropriate.  Patient Active Problem List   Diagnosis Date Noted  . Anxiety state 03/09/2015  . Chest pain 03/01/2015  . GERD (gastroesophageal reflux disease) 03/01/2015  . Pain in joint, shoulder region 01/06/2014  . Myalgia and myositis 12/28/2013  . CAP (community acquired pneumonia) 12/16/2013  . Well adult exam 07/23/2013  . Routine health maintenance 11/13/2010  . UNSPECIFIED NEURALGIA NEURITIS AND RADICULITIS 04/28/2007  . BACK PAIN, LUMBAR 03/27/2007  . POLYP, COLON 03/17/2007  . HYPERLIPIDEMIA 03/17/2007  . Situational anxiety 03/17/2007  . Essential hypertension 03/17/2007  . Asthma 03/17/2007  . DEGENERATIVE JOINT DISEASE 03/17/2007  . INSOMNIA 03/17/2007  . DILATION AND CURETTAGE, HX OF 03/17/2007    Current Outpatient Prescriptions on File Prior to Visit  Medication Sig Dispense Refill  . aspirin 81 MG tablet Take 81 mg by mouth daily.      Marland Kitchen atorvastatin (LIPITOR) 20 MG tablet Take 1 tablet (20 mg total) by mouth daily. 30 tablet 0  . benazepril (LOTENSIN) 40 MG tablet Take 1 tablet (40 mg total) by mouth daily. Overdue for yearly physical must see MD for refills 30 tablet 0  . diazepam (VALIUM) 5 MG tablet Take 1 tablet (5 mg total) by mouth every 6 (six) hours as needed. for anxiety 30 tablet 3  . FLUoxetine (PROZAC) 10 MG tablet Take 1 tablet (10 mg total) by mouth daily. 30 tablet 6  . hydrochlorothiazide (HYDRODIURIL) 25 MG tablet Take 1 tablet (25 mg total) by  mouth daily. 30 tablet 0  . metoprolol succinate (TOPROL-XL) 25 MG 24 hr tablet Take 1 tablet (25 mg total) by mouth daily. Overdue for yearly physical w/labs must see MD for refills 30 tablet 0  . MULTIPLE VITAMIN PO Take by mouth.      . nitroGLYCERIN (NITROSTAT) 0.4 MG SL tablet Place 1 tablet (0.4 mg total) under the tongue every 5 (five) minutes as needed for chest pain. 20 tablet 1  . pantoprazole (PROTONIX) 40 MG tablet Take 1 tablet (40 mg total) by mouth daily. 30 tablet 0   No current facility-administered medications on file prior to visit.     Past Medical History:  Diagnosis Date  . DJD (degenerative joint disease)   . History of asthma   . History of colon polyps   . Hyperlipidemia   . Hypertension   . Insomnia     Past Surgical History:  Procedure Laterality Date  . DILATION AND CURETTAGE OF UTERUS    . OTHER SURGICAL HISTORY  09/2011   mammogram and bone density  . POLYPECTOMY    . TUBAL LIGATION      Social History   Social History  . Marital status: Married    Spouse name: N/A  . Number of children: N/A  . Years of education: N/A   Social History Main Topics  . Smoking status: Former Research scientist (life sciences)  . Smokeless tobacco: Not on file  . Alcohol use Yes  Comment: wine  . Drug use: No  . Sexual activity: Not on file   Other Topics Concern  . Not on file   Social History Narrative   HSG   Married '61   2 sons- '69, '78, 2 daughters- '63, '65, 4 grandchildren   Worked- Magazine features editor, Medical sales representative work, homemaker    Family History  Problem Relation Age of Onset  . Cancer Mother   . Hypertension Mother   . Cancer Father     Review of Systems  Constitutional: Positive for fatigue. Negative for appetite change and fever.  HENT: Positive for sore throat and voice change. Negative for congestion, ear pain, sinus pain and sinus pressure.   Respiratory: Positive for cough (yellow phlegm). Negative for chest tightness, shortness of breath and wheezing.     Cardiovascular: Negative for chest pain.  Gastrointestinal: Negative for diarrhea, nausea and vomiting.  Musculoskeletal: Positive for myalgias (shoulders only).  Neurological: Positive for headaches. Negative for dizziness and light-headedness.       Objective:   Vitals:   05/21/16 1617  BP: (!) 170/92  Pulse: 70  Resp: 16  Temp: 98 F (36.7 C)   Filed Weights   05/21/16 1617  Weight: 128 lb (58.1 kg)   Body mass index is 25 kg/m.  Wt Readings from Last 3 Encounters:  05/21/16 128 lb (58.1 kg)  03/09/15 131 lb (59.4 kg)  03/01/15 130 lb (59 kg)     Physical Exam GENERAL APPEARANCE: Appears stated age, well appearing, NAD EYES: conjunctiva clear, no icterus HEENT: bilateral tympanic membranes and ear canals normal, oropharynx with no erythema, no thyromegaly, trachea midline, no cervical or supraclavicular lymphadenopathy LUNGS: Clear to auscultation without wheeze or crackles, unlabored breathing, good air entry bilaterally HEART: Normal S1,S2 without murmurs EXTREMITIES: Without clubbing, cyanosis, or edema        Assessment & Plan:   See Problem List for Assessment and Plan of chronic medical problems.

## 2016-05-21 NOTE — Patient Instructions (Signed)
Take the cough syrup as needed.  Take aleve or over the counter cold medications as needed.  An antibiotic was given to you - use it only if needed

## 2016-05-21 NOTE — Assessment & Plan Note (Signed)
Likely viral, but does have a history of PNA Aleve, hycodan cough syrup as needed Rest, fluids I will give her a prescription for an antibiotic to use only if needed - if symptoms worsen in the next couple of day - take Friday if needed -- we both agree it is better to avoid the antibiotic if possible

## 2016-05-21 NOTE — Progress Notes (Signed)
Pre visit review using our clinic review tool, if applicable. No additional management support is needed unless otherwise documented below in the visit note. 

## 2016-05-22 ENCOUNTER — Ambulatory Visit: Payer: Self-pay | Admitting: Internal Medicine

## 2016-05-24 ENCOUNTER — Other Ambulatory Visit (INDEPENDENT_AMBULATORY_CARE_PROVIDER_SITE_OTHER): Payer: Self-pay

## 2016-05-24 ENCOUNTER — Telehealth: Payer: Self-pay | Admitting: Internal Medicine

## 2016-05-24 DIAGNOSIS — Z0000 Encounter for general adult medical examination without abnormal findings: Secondary | ICD-10-CM

## 2016-05-24 LAB — BASIC METABOLIC PANEL
BUN: 9 mg/dL (ref 6–23)
CALCIUM: 9.6 mg/dL (ref 8.4–10.5)
CO2: 30 mEq/L (ref 19–32)
CREATININE: 0.71 mg/dL (ref 0.40–1.20)
Chloride: 99 mEq/L (ref 96–112)
GFR: 84.19 mL/min (ref >60.00)
Glucose, Bld: 96 mg/dL (ref 70–99)
Potassium: 3.9 mEq/L (ref 3.5–5.1)
SODIUM: 135 meq/L (ref 135–145)

## 2016-05-24 LAB — HEPATIC FUNCTION PANEL
ALBUMIN: 4.2 g/dL (ref 3.5–5.2)
ALK PHOS: 54 U/L (ref 39–117)
ALT: 14 U/L (ref 0–35)
AST: 17 U/L (ref 0–37)
Bilirubin, Direct: 0.1 mg/dL (ref 0.0–0.3)
TOTAL PROTEIN: 7 g/dL (ref 6.0–8.3)
Total Bilirubin: 0.4 mg/dL (ref 0.2–1.2)

## 2016-05-24 LAB — LIPID PANEL
CHOL/HDL RATIO: 3
Cholesterol: 145 mg/dL (ref 0–200)
HDL: 46.1 mg/dL (ref >39.00)
LDL Cholesterol: 66 mg/dL (ref 0–99)
NonHDL: 98.68
TRIGLYCERIDES: 163 mg/dL — AB (ref 0.0–149.0)
VLDL: 32.6 mg/dL (ref 0.0–40.0)

## 2016-05-24 LAB — URINALYSIS, ROUTINE W REFLEX MICROSCOPIC
BILIRUBIN URINE: NEGATIVE
KETONES UR: NEGATIVE
Nitrite: NEGATIVE
PH: 6 (ref 5.0–8.0)
SPECIFIC GRAVITY, URINE: 1.015 (ref 1.000–1.030)
URINE GLUCOSE: NEGATIVE
UROBILINOGEN UA: 0.2 (ref 0.0–1.0)

## 2016-05-24 LAB — TSH: TSH: 0.95 u[IU]/mL (ref 0.35–4.50)

## 2016-05-27 MED ORDER — CIPROFLOXACIN HCL 250 MG PO TABS: 250.0000 mg | ORAL_TABLET | Freq: Two times a day (BID) | ORAL | 0 refills | Status: DC

## 2016-05-28 ENCOUNTER — Encounter: Payer: Self-pay | Admitting: Internal Medicine

## 2016-05-28 ENCOUNTER — Ambulatory Visit (INDEPENDENT_AMBULATORY_CARE_PROVIDER_SITE_OTHER): Payer: Medicare Other | Admitting: Internal Medicine

## 2016-05-28 VITALS — BP 190/80 | HR 62 | Temp 97.8°F | Ht 60.0 in | Wt 127.1 lb

## 2016-05-28 DIAGNOSIS — J22 Unspecified acute lower respiratory infection: Secondary | ICD-10-CM

## 2016-05-28 DIAGNOSIS — Z0000 Encounter for general adult medical examination without abnormal findings: Secondary | ICD-10-CM

## 2016-05-28 DIAGNOSIS — I10 Essential (primary) hypertension: Secondary | ICD-10-CM

## 2016-05-28 DIAGNOSIS — F418 Other specified anxiety disorders: Secondary | ICD-10-CM

## 2016-05-28 MED ORDER — VALSARTAN-HYDROCHLOROTHIAZIDE 320-25 MG PO TABS: 1.0000 | ORAL_TABLET | Freq: Every day | ORAL | 11 refills | Status: DC

## 2016-05-28 MED ORDER — PROMETHAZINE-CODEINE 6.25-10 MG/5ML PO SYRP: 5.0000 mL | ORAL_SOLUTION | ORAL | 0 refills | Status: DC | PRN

## 2016-05-28 NOTE — Progress Notes (Signed)
Pre visit review using our clinic review tool, if applicable. No additional management support is needed unless otherwise documented below in the visit note. 

## 2016-05-28 NOTE — Assessment & Plan Note (Signed)
Here for medicare wellness/physical  Diet: heart healthy  Physical activity: not sedentary  Depression/mood screen: - anxiety  Hearing: intact to whispered voice  Visual acuity: grossly normal w/glasses, performs annual eye exam  ADLs: capable  Fall risk: low to none  Home safety: good  Cognitive evaluation: intact to orientation, naming, recall and repetition  EOL planning: adv directives, full code/ I agree  I have personally reviewed and have noted  1. The patient's medical, surgical and social history  2. Their use of alcohol, tobacco or illicit drugs  3. Their current medications and supplements  4. The patient's functional ability including ADL's, fall risks, home safety risks and hearing or visual impairment.  5. Diet and physical activities  6. Evidence for depression or mood disorders 7. The roster of all physicians providing medical care to patient - is listed in the Snapshot section of the chart and reviewed today.    Today patient counseled on age appropriate routine health concerns for screening and prevention, each reviewed and up to date or declined. Immunizations reviewed and up to date or declined. Labs ordered and reviewed. Risk factors for depression reviewed and negative. Hearing function and visual acuity are intact. ADLs screened and addressed as needed. Functional ability and level of safety reviewed and appropriate. Education, counseling and referrals performed based on assessed risks today. Patient provided with a copy of personalized plan for preventive services.      

## 2016-05-28 NOTE — Telephone Encounter (Signed)
error 

## 2016-05-28 NOTE — Patient Instructions (Addendum)
Stop Benazepril and HCTZ. Start Valsartan HCT instead   Health Maintenance for Postmenopausal Women Menopause is a normal process in which your reproductive ability comes to an end. This process happens gradually over a span of months to years, usually between the ages of 75 and 46. Menopause is complete when you have missed 12 consecutive menstrual periods. It is important to talk with your health care provider about some of the most common conditions that affect postmenopausal women, such as heart disease, cancer, and bone loss (osteoporosis). Adopting a healthy lifestyle and getting preventive care can help to promote your health and wellness. Those actions can also lower your chances of developing some of these common conditions. What should I know about menopause? During menopause, you may experience a number of symptoms, such as:  Moderate-to-severe hot flashes.  Night sweats.  Decrease in sex drive.  Mood swings.  Headaches.  Tiredness.  Irritability.  Memory problems.  Insomnia. Choosing to treat or not to treat menopausal changes is an individual decision that you make with your health care provider. What should I know about hormone replacement therapy and supplements? Hormone therapy products are effective for treating symptoms that are associated with menopause, such as hot flashes and night sweats. Hormone replacement carries certain risks, especially as you become older. If you are thinking about using estrogen or estrogen with progestin treatments, discuss the benefits and risks with your health care provider. What should I know about heart disease and stroke? Heart disease, heart attack, and stroke become more likely as you age. This may be due, in part, to the hormonal changes that your body experiences during menopause. These can affect how your body processes dietary fats, triglycerides, and cholesterol. Heart attack and stroke are both medical emergencies. There are  many things that you can do to help prevent heart disease and stroke:  Have your blood pressure checked at least every 1-2 years. High blood pressure causes heart disease and increases the risk of stroke.  If you are 27-88 years old, ask your health care provider if you should take aspirin to prevent a heart attack or a stroke.  Do not use any tobacco products, including cigarettes, chewing tobacco, or electronic cigarettes. If you need help quitting, ask your health care provider.  It is important to eat a healthy diet and maintain a healthy weight.  Be sure to include plenty of vegetables, fruits, low-fat dairy products, and lean protein.  Avoid eating foods that are high in solid fats, added sugars, or salt (sodium).  Get regular exercise. This is one of the most important things that you can do for your health.  Try to exercise for at least 150 minutes each week. The type of exercise that you do should increase your heart rate and make you sweat. This is known as moderate-intensity exercise.  Try to do strengthening exercises at least twice each week. Do these in addition to the moderate-intensity exercise.  Know your numbers.Ask your health care provider to check your cholesterol and your blood glucose. Continue to have your blood tested as directed by your health care provider. What should I know about cancer screening? There are several types of cancer. Take the following steps to reduce your risk and to catch any cancer development as early as possible. Breast Cancer  Practice breast self-awareness.  This means understanding how your breasts normally appear and feel.  It also means doing regular breast self-exams. Let your health care provider know about any changes, no  matter how small.  If you are 63 or older, have a clinician do a breast exam (clinical breast exam or CBE) every year. Depending on your age, family history, and medical history, it may be recommended that you  also have a yearly breast X-ray (mammogram).  If you have a family history of breast cancer, talk with your health care provider about genetic screening.  If you are at high risk for breast cancer, talk with your health care provider about having an MRI and a mammogram every year.  Breast cancer (BRCA) gene test is recommended for women who have family members with BRCA-related cancers. Results of the assessment will determine the need for genetic counseling and BRCA1 and for BRCA2 testing. BRCA-related cancers include these types:  Breast. This occurs in males or females.  Ovarian.  Tubal. This may also be called fallopian tube cancer.  Cancer of the abdominal or pelvic lining (peritoneal cancer).  Prostate.  Pancreatic. Cervical, Uterine, and Ovarian Cancer  Your health care provider may recommend that you be screened regularly for cancer of the pelvic organs. These include your ovaries, uterus, and vagina. This screening involves a pelvic exam, which includes checking for microscopic changes to the surface of your cervix (Pap test).  For women ages 21-65, health care providers may recommend a pelvic exam and a Pap test every three years. For women ages 71-65, they may recommend the Pap test and pelvic exam, combined with testing for human papilloma virus (HPV), every five years. Some types of HPV increase your risk of cervical cancer. Testing for HPV may also be done on women of any age who have unclear Pap test results.  Other health care providers may not recommend any screening for nonpregnant women who are considered low risk for pelvic cancer and have no symptoms. Ask your health care provider if a screening pelvic exam is right for you.  If you have had past treatment for cervical cancer or a condition that could lead to cancer, you need Pap tests and screening for cancer for at least 20 years after your treatment. If Pap tests have been discontinued for you, your risk factors  (such as having a new sexual partner) need to be reassessed to determine if you should start having screenings again. Some women have medical problems that increase the chance of getting cervical cancer. In these cases, your health care provider may recommend that you have screening and Pap tests more often.  If you have a family history of uterine cancer or ovarian cancer, talk with your health care provider about genetic screening.  If you have vaginal bleeding after reaching menopause, tell your health care provider.  There are currently no reliable tests available to screen for ovarian cancer. Lung Cancer  Lung cancer screening is recommended for adults 66-65 years old who are at high risk for lung cancer because of a history of smoking. A yearly low-dose CT scan of the lungs is recommended if you:  Currently smoke.  Have a history of at least 30 pack-years of smoking and you currently smoke or have quit within the past 15 years. A pack-year is smoking an average of one pack of cigarettes per day for one year. Yearly screening should:  Continue until it has been 15 years since you quit.  Stop if you develop a health problem that would prevent you from having lung cancer treatment. Colorectal Cancer  This type of cancer can be detected and can often be prevented.  Routine  colorectal cancer screening usually begins at age 85 and continues through age 68.  If you have risk factors for colon cancer, your health care provider may recommend that you be screened at an earlier age.  If you have a family history of colorectal cancer, talk with your health care provider about genetic screening.  Your health care provider may also recommend using home test kits to check for hidden blood in your stool.  A small camera at the end of a tube can be used to examine your colon directly (sigmoidoscopy or colonoscopy). This is done to check for the earliest forms of colorectal cancer.  Direct  examination of the colon should be repeated every 5-10 years until age 75. However, if early forms of precancerous polyps or small growths are found or if you have a family history or genetic risk for colorectal cancer, you may need to be screened more often. Skin Cancer  Check your skin from head to toe regularly.  Monitor any moles. Be sure to tell your health care provider:  About any new moles or changes in moles, especially if there is a change in a mole's shape or color.  If you have a mole that is larger than the size of a pencil eraser.  If any of your family members has a history of skin cancer, especially at a young age, talk with your health care provider about genetic screening.  Always use sunscreen. Apply sunscreen liberally and repeatedly throughout the day.  Whenever you are outside, protect yourself by wearing long sleeves, pants, a wide-brimmed hat, and sunglasses. What should I know about osteoporosis? Osteoporosis is a condition in which bone destruction happens more quickly than new bone creation. After menopause, you may be at an increased risk for osteoporosis. To help prevent osteoporosis or the bone fractures that can happen because of osteoporosis, the following is recommended:  If you are 88-21 years old, get at least 1,000 mg of calcium and at least 600 mg of vitamin D per day.  If you are older than age 24 but younger than age 38, get at least 1,200 mg of calcium and at least 600 mg of vitamin D per day.  If you are older than age 59, get at least 1,200 mg of calcium and at least 800 mg of vitamin D per day. Smoking and excessive alcohol intake increase the risk of osteoporosis. Eat foods that are rich in calcium and vitamin D, and do weight-bearing exercises several times each week as directed by your health care provider. What should I know about how menopause affects my mental health? Depression may occur at any age, but it is more common as you become older.  Common symptoms of depression include:  Low or sad mood.  Changes in sleep patterns.  Changes in appetite or eating patterns.  Feeling an overall lack of motivation or enjoyment of activities that you previously enjoyed.  Frequent crying spells. Talk with your health care provider if you think that you are experiencing depression. What should I know about immunizations? It is important that you get and maintain your immunizations. These include:  Tetanus, diphtheria, and pertussis (Tdap) booster vaccine.  Influenza every year before the flu season begins.  Pneumonia vaccine.  Shingles vaccine. Your health care provider may also recommend other immunizations. This information is not intended to replace advice given to you by your health care provider. Make sure you discuss any questions you have with your health care provider. Document Released:  03/29/2005 Document Revised: 08/25/2015 Document Reviewed: 11/08/2014 Elsevier Interactive Patient Education  2017 [employer].

## 2016-05-28 NOTE — Progress Notes (Signed)
Subjective:  Patient ID: Whitney Benson, female    DOB: June 11, 1934  Age: 82 y.o. MRN: [id number]  CC: No chief complaint on file.   HPI Whitney Benson presents for a well exam today  Outpatient Medications Prior to Visit  Medication Sig Dispense Refill  . aspirin 81 MG tablet Take 81 mg by mouth daily.      Marland Kitchen atorvastatin (LIPITOR) 20 MG tablet Take 1 tablet (20 mg total) by mouth daily. 30 tablet 0  . benazepril (LOTENSIN) 40 MG tablet Take 1 tablet (40 mg total) by mouth daily. Overdue for yearly physical must see MD for refills 30 tablet 0  . ciprofloxacin (CIPRO) 250 MG tablet Take 1 tablet (250 mg total) by mouth 2 (two) times daily. 6 tablet 0  . diazepam (VALIUM) 5 MG tablet Take 1 tablet (5 mg total) by mouth every 6 (six) hours as needed. for anxiety 30 tablet 3  . hydrochlorothiazide (HYDRODIURIL) 25 MG tablet Take 1 tablet (25 mg total) by mouth daily. 30 tablet 0  . metoprolol succinate (TOPROL-XL) 25 MG 24 hr tablet Take 1 tablet (25 mg total) by mouth daily. Overdue for yearly physical w/labs must see MD for refills 30 tablet 0  . MULTIPLE VITAMIN PO Take by mouth.      . nitroGLYCERIN (NITROSTAT) 0.4 MG SL tablet Place 1 tablet (0.4 mg total) under the tongue every 5 (five) minutes as needed for chest pain. 20 tablet 1  . pantoprazole (PROTONIX) 40 MG tablet Take 1 tablet (40 mg total) by mouth daily. 30 tablet 0  . HYDROcodone-homatropine (HYCODAN) 5-1.5 MG/5ML syrup Take 5 mLs by mouth every 8 (eight) hours as needed for cough. 120 mL 0  . cefdinir (OMNICEF) 300 MG capsule Take 1 capsule (300 mg total) by mouth 2 (two) times daily. (Patient not taking: Reported on 05/28/2016) 20 capsule 0  . FLUoxetine (PROZAC) 10 MG tablet Take 1 tablet (10 mg total) by mouth daily. (Patient not taking: Reported on 05/28/2016) 30 tablet 6   No facility-administered medications prior to visit.     ROS Review of Systems  Constitutional: Negative for activity change, appetite change,  chills, fatigue and unexpected weight change.  HENT: Negative for congestion, mouth sores and sinus pressure.   Eyes: Negative for visual disturbance.  Respiratory: Negative for cough and chest tightness.   Gastrointestinal: Negative for abdominal pain and nausea.  Genitourinary: Negative for difficulty urinating, frequency and vaginal pain.  Musculoskeletal: Negative for back pain and gait problem.  Skin: Negative for pallor and rash.  Neurological: Negative for dizziness, tremors, weakness, numbness and headaches.  Psychiatric/Behavioral: Negative for confusion and sleep disturbance.    Objective:  BP (!) 190/80 (BP Location: Left Arm, Patient Position: Sitting, Cuff Size: Normal)   Pulse 62   Temp 97.8 F (36.6 C) (Oral)   Ht 5' (1.524 m)   Wt 127 lb 1.9 oz (57.7 kg)   SpO2 98%   BMI 24.83 kg/m   BP Readings from Last 3 Encounters:  05/28/16 (!) 190/80  05/21/16 (!) 170/92  03/09/15 124/90    Wt Readings from Last 3 Encounters:  05/28/16 127 lb 1.9 oz (57.7 kg)  05/21/16 128 lb (58.1 kg)  03/09/15 131 lb (59.4 kg)    Physical Exam  Constitutional: She appears well-developed. No distress.  HENT:  Head: Normocephalic.  Right Ear: External ear normal.  Left Ear: External ear normal.  Nose: Nose normal.  Mouth/Throat: Oropharynx is clear and moist.  Eyes: Conjunctivae are normal. Pupils are equal, round, and reactive to light. Right eye exhibits no discharge. Left eye exhibits no discharge.  Neck: Normal range of motion. Neck supple. No JVD present. No tracheal deviation present. No thyromegaly present.  Cardiovascular: Normal rate, regular rhythm and normal heart sounds.   Pulmonary/Chest: No stridor. No respiratory distress. She has no wheezes.  Abdominal: Soft. Bowel sounds are normal. She exhibits no distension and no mass. There is no tenderness. There is no rebound and no guarding.  Musculoskeletal: She exhibits no edema or tenderness.  Lymphadenopathy:    She  has no cervical adenopathy.  Neurological: She displays normal reflexes. No cranial nerve deficit. She exhibits normal muscle tone. Coordination normal.  Skin: No rash noted. No erythema.  Psychiatric: She has a normal mood and affect. Her behavior is normal. Judgment and thought content normal.    Lab Results  Component Value Date   WBC 5.6 03/01/2015   HGB 13.1 03/01/2015   HCT 39.8 03/01/2015   PLT 248.0 03/01/2015   GLUCOSE 96 05/24/2016   CHOL 145 05/24/2016   TRIG 163.0 (H) 05/24/2016   HDL 46.10 05/24/2016   LDLDIRECT 84.1 07/20/2012   LDLCALC 66 05/24/2016   ALT 14 05/24/2016   AST 17 05/24/2016   NA 135 05/24/2016   K 3.9 05/24/2016   CL 99 05/24/2016   CREATININE 0.71 05/24/2016   BUN 9 05/24/2016   CO2 30 05/24/2016   TSH 0.95 05/24/2016    Dg Chest 2 View  Result Date: 03/01/2015 CLINICAL DATA:  Chest pain, unspecified EXAM: CHEST  2 VIEW COMPARISON:  12/16/2013 FINDINGS: Minimal atelectasis or scar at the bases. There is no edema, consolidation, effusion, or pneumothorax. Normal heart size and aortic contours. No osseous findings to explain acute chest pain. IMPRESSION: No evidence for active cardiopulmonary disease. Electronically Signed   By: Monte Fantasia M.D.   On: 03/01/2015 12:50    Assessment & Plan:   There are no diagnoses linked to this encounter. I have discontinued Whitney Benson's FLUoxetine, HYDROcodone-homatropine, and cefdinir. I am also having her maintain her aspirin, MULTIPLE VITAMIN PO, diazepam, nitroGLYCERIN, atorvastatin, benazepril, hydrochlorothiazide, metoprolol succinate, pantoprazole, and ciprofloxacin.  No orders of the defined types were placed in this encounter.    Follow-up: No Follow-up on file.  Walker Kehr, MD

## 2016-05-28 NOTE — Assessment & Plan Note (Signed)
D/c'd prozac

## 2016-05-28 NOTE — Assessment & Plan Note (Signed)
Worse  Stop Benazepril and HCTZ. Start Valsartan HCT instead

## 2016-05-28 NOTE — Assessment & Plan Note (Signed)
Never took Cefdinir - recovered

## 2016-06-04 ENCOUNTER — Other Ambulatory Visit: Payer: Self-pay | Admitting: *Deleted

## 2016-06-04 MED ORDER — ATORVASTATIN CALCIUM 20 MG PO TABS: 20.0000 mg | ORAL_TABLET | Freq: Every day | ORAL | 3 refills | Status: DC

## 2016-06-14 ENCOUNTER — Other Ambulatory Visit: Payer: Self-pay | Admitting: *Deleted

## 2016-06-14 MED ORDER — METOPROLOL SUCCINATE ER 25 MG PO TB24: 25.0000 mg | ORAL_TABLET | Freq: Every day | ORAL | 11 refills | Status: DC

## 2016-06-17 ENCOUNTER — Other Ambulatory Visit: Payer: Self-pay | Admitting: *Deleted

## 2016-06-17 NOTE — Telephone Encounter (Signed)
Rec'd fax refill for benazepril. Faxed bck DENIED pt is no longer taking. Med D/C at last visit and should be taking Diovan/HCTZ...Whitney Benson

## 2016-12-09 ENCOUNTER — Ambulatory Visit (INDEPENDENT_AMBULATORY_CARE_PROVIDER_SITE_OTHER): Payer: Medicare Other

## 2016-12-09 ENCOUNTER — Telehealth: Payer: Self-pay

## 2016-12-09 DIAGNOSIS — Z23 Encounter for immunization: Secondary | ICD-10-CM

## 2016-12-09 MED ORDER — DIAZEPAM 5 MG PO TABS: 5.0000 mg | ORAL_TABLET | Freq: Four times a day (QID) | ORAL | 3 refills | Status: DC | PRN

## 2016-12-09 MED ORDER — MELOXICAM 7.5 MG PO TABS: 7.5000 mg | ORAL_TABLET | Freq: Every day | ORAL | 1 refills | Status: DC

## 2016-12-09 NOTE — Telephone Encounter (Signed)
meloxicam emailed Rx Diazepam Rx printed Thx

## 2016-12-09 NOTE — Addendum Note (Signed)
Addended by: Cassandria Anger on: 12/09/2016 04:42 PM   Modules accepted: Orders

## 2016-12-09 NOTE — Telephone Encounter (Signed)
Patient came by the office today requesting rx refills for diazepam 5mg  tablet and meloxicam 15mg  tablet---meloxicam is not on patients current med list----routing to dr plotnikov, please advise on refills for both medications---I will call patient back, thanks

## 2016-12-10 ENCOUNTER — Ambulatory Visit: Payer: Medicare Other

## 2016-12-10 NOTE — Telephone Encounter (Signed)
Left message advising patient to pick up rx at office and also rx sent to pharm

## 2017-01-15 DIAGNOSIS — N958 Other specified menopausal and perimenopausal disorders: Secondary | ICD-10-CM | POA: Diagnosis not present

## 2017-01-15 DIAGNOSIS — Z01419 Encounter for gynecological examination (general) (routine) without abnormal findings: Secondary | ICD-10-CM | POA: Diagnosis not present

## 2017-01-15 DIAGNOSIS — M8588 Other specified disorders of bone density and structure, other site: Secondary | ICD-10-CM | POA: Diagnosis not present

## 2017-01-15 DIAGNOSIS — Z1231 Encounter for screening mammogram for malignant neoplasm of breast: Secondary | ICD-10-CM | POA: Diagnosis not present

## 2017-01-15 DIAGNOSIS — Z6824 Body mass index (BMI) 24.0-24.9, adult: Secondary | ICD-10-CM | POA: Diagnosis not present

## 2017-01-15 LAB — HM MAMMOGRAPHY

## 2017-01-20 ENCOUNTER — Other Ambulatory Visit: Payer: Self-pay | Admitting: Obstetrics and Gynecology

## 2017-01-20 DIAGNOSIS — R928 Other abnormal and inconclusive findings on diagnostic imaging of breast: Secondary | ICD-10-CM

## 2017-01-23 ENCOUNTER — Ambulatory Visit
Admission: RE | Admit: 2017-01-23 | Discharge: 2017-01-23 | Disposition: A | Discharge status: Home / Self Care | Payer: Medicare Other | Source: Ambulatory Visit | Attending: Obstetrics and Gynecology | Admitting: Obstetrics and Gynecology

## 2017-01-23 ENCOUNTER — Ambulatory Visit: Payer: Medicare Other

## 2017-01-23 DIAGNOSIS — R928 Other abnormal and inconclusive findings on diagnostic imaging of breast: Secondary | ICD-10-CM

## 2017-02-10 ENCOUNTER — Ambulatory Visit: Payer: Medicare Other | Admitting: Internal Medicine

## 2017-02-25 ENCOUNTER — Encounter: Payer: Self-pay | Admitting: Internal Medicine

## 2017-02-25 NOTE — Progress Notes (Signed)
Outside notes received. Information abstracted. Notes sent to scan.  

## 2017-03-07 ENCOUNTER — Ambulatory Visit: Payer: Medicare Other | Admitting: Internal Medicine

## 2017-05-26 ENCOUNTER — Telehealth: Payer: Self-pay | Admitting: Internal Medicine

## 2017-05-26 MED ORDER — ATORVASTATIN CALCIUM 20 MG PO TABS: 20.0000 mg | ORAL_TABLET | Freq: Every day | ORAL | 3 refills | Status: DC

## 2017-05-26 NOTE — Telephone Encounter (Signed)
Lipitor rx - done

## 2017-06-16 ENCOUNTER — Other Ambulatory Visit: Payer: Self-pay | Admitting: Internal Medicine

## 2017-06-25 ENCOUNTER — Telehealth: Payer: Self-pay | Admitting: Internal Medicine

## 2017-06-25 MED ORDER — ZOLPIDEM TARTRATE 5 MG PO TABS: 5.0000 mg | ORAL_TABLET | Freq: Every evening | ORAL | 1 refills | Status: DC | PRN

## 2017-06-25 NOTE — Telephone Encounter (Signed)
Please advise 

## 2017-06-25 NOTE — Telephone Encounter (Signed)
Copied from [place] [phone]. Topic: General - Other >> Jun 25, 2017  1:54 PM Lennox Solders wrote: Reason for CRM: pt husband just passed away on 01-Jun-2022. Pt decline an appt to see dr plotnikov today. Pt daughter is requesting Lorrin Mais to be call into [facility]. Pt is having trouble sleeping

## 2017-06-25 NOTE — Telephone Encounter (Signed)
I spoke w/Whitney Benson She is aware Thx

## 2017-06-30 NOTE — Telephone Encounter (Signed)
Pt's daughter called and states that pt is not eating, vomiting, diarrhea and pt is losing weight. Daughter is concerned that something will happen to mother and would like for someone to call the pt asap.

## 2017-07-01 NOTE — Telephone Encounter (Signed)
LMTCB

## 2017-07-01 NOTE — Telephone Encounter (Signed)
Pt does not wasn't to make an appointment due to too much going on. Pt believes everything is coming from stress and would like something called in to help her relax.

## 2017-07-02 NOTE — Telephone Encounter (Signed)
Does she have a diazepam Rx? - she can use it prn anxiety Thx

## 2017-07-02 NOTE — Telephone Encounter (Signed)
LMTCB

## 2017-07-04 MED ORDER — DIAZEPAM 5 MG PO TABS: 5.0000 mg | ORAL_TABLET | Freq: Four times a day (QID) | ORAL | 3 refills | Status: DC | PRN

## 2017-07-04 NOTE — Telephone Encounter (Signed)
Done. Thx.

## 2017-07-04 NOTE — Addendum Note (Signed)
Addended by: Cassandria Anger on: 07/04/2017 12:11 PM   Modules accepted: Orders

## 2017-07-04 NOTE — Telephone Encounter (Signed)
Pt has only a few pills left and would like a refill sent in

## 2017-07-18 ENCOUNTER — Other Ambulatory Visit: Payer: Self-pay | Admitting: Internal Medicine

## 2017-07-18 NOTE — Telephone Encounter (Signed)
Patient has made an appointment for July 2.

## 2017-07-18 NOTE — Telephone Encounter (Signed)
Please call pt to set up appt to get med refills

## 2017-08-08 ENCOUNTER — Encounter: Payer: Self-pay | Admitting: Family

## 2017-08-08 ENCOUNTER — Ambulatory Visit: Payer: Medicare Other | Admitting: Family

## 2017-08-08 VITALS — BP 150/84 | HR 73 | Temp 97.7°F | Ht 60.0 in | Wt 121.0 lb

## 2017-08-08 DIAGNOSIS — S41152A Open bite of left upper arm, initial encounter: Secondary | ICD-10-CM

## 2017-08-08 DIAGNOSIS — Z23 Encounter for immunization: Secondary | ICD-10-CM | POA: Diagnosis not present

## 2017-08-08 DIAGNOSIS — S61452A Open bite of left hand, initial encounter: Secondary | ICD-10-CM

## 2017-08-08 DIAGNOSIS — S91351A Open bite, right foot, initial encounter: Secondary | ICD-10-CM

## 2017-08-08 DIAGNOSIS — S91352A Open bite, left foot, initial encounter: Secondary | ICD-10-CM

## 2017-08-08 DIAGNOSIS — R238 Other skin changes: Secondary | ICD-10-CM

## 2017-08-08 DIAGNOSIS — W540XXA Bitten by dog, initial encounter: Secondary | ICD-10-CM

## 2017-08-08 DIAGNOSIS — T148XXA Other injury of unspecified body region, initial encounter: Secondary | ICD-10-CM

## 2017-08-08 MED ORDER — ACETAMINOPHEN-CODEINE #3 300-30 MG PO TABS: 1.0000 | ORAL_TABLET | Freq: Four times a day (QID) | ORAL | 0 refills | Status: AC | PRN

## 2017-08-08 MED ORDER — AMOXICILLIN-POT CLAVULANATE 875-125 MG PO TABS: 1.0000 | ORAL_TABLET | Freq: Two times a day (BID) | ORAL | 0 refills | Status: DC

## 2017-08-08 NOTE — Progress Notes (Signed)
Whitney Benson is a 83 y.o. female with the following history as recorded in [employer]:  Patient Active Problem List   Diagnosis Date Noted  . Lower respiratory infection 05/21/2016  . Anxiety state 03/09/2015  . Chest pain 03/01/2015  . GERD (gastroesophageal reflux disease) 03/01/2015  . Pain in joint, shoulder region 01/06/2014  . Myalgia and myositis 12/28/2013  . CAP (community acquired pneumonia) 12/16/2013  . Well adult exam 07/23/2013  . Routine health maintenance 11/13/2010  . UNSPECIFIED NEURALGIA NEURITIS AND RADICULITIS 04/28/2007  . BACK PAIN, LUMBAR 03/27/2007  . POLYP, COLON 03/17/2007  . HYPERLIPIDEMIA 03/17/2007  . Situational anxiety 03/17/2007  . Essential hypertension 03/17/2007  . Asthma 03/17/2007  . DEGENERATIVE JOINT DISEASE 03/17/2007  . INSOMNIA 03/17/2007  . DILATION AND CURETTAGE, HX OF 03/17/2007    Current Outpatient Medications  Medication Sig Dispense Refill  . aspirin 81 MG tablet Take 81 mg by mouth daily.      Marland Kitchen atorvastatin (LIPITOR) 20 MG tablet Take 1 tablet (20 mg total) by mouth daily. 90 tablet 3  . ciprofloxacin (CIPRO) 250 MG tablet Take 1 tablet (250 mg total) by mouth 2 (two) times daily. 6 tablet 0  . diazepam (VALIUM) 5 MG tablet Take 1 tablet (5 mg total) by mouth every 6 (six) hours as needed. for anxiety 30 tablet 3  . meloxicam (MOBIC) 7.5 MG tablet Take 1 tablet (7.5 mg total) by mouth daily. 30 tablet 1  . metoprolol succinate (TOPROL-XL) 25 MG 24 hr tablet Take 1 tablet (25 mg total) by mouth daily. Annual appt is due  must see provider for future refills 30 tablet 0  . MULTIPLE VITAMIN PO Take by mouth.      . nitroGLYCERIN (NITROSTAT) 0.4 MG SL tablet Place 1 tablet (0.4 mg total) under the tongue every 5 (five) minutes as needed for chest pain. 20 tablet 1  . pantoprazole (PROTONIX) 40 MG tablet Take 1 tablet (40 mg total) by mouth daily. 30 tablet 0  . valsartan-hydrochlorothiazide (DIOVAN-HCT) 320-25 MG tablet TAKE 1 TABLET  BY MOUTH DAILY 30 tablet 0  . zolpidem (AMBIEN) 5 MG tablet Take 1 tablet (5 mg total) by mouth at bedtime as needed for sleep. 30 tablet 1  . acetaminophen-codeine (TYLENOL #3) 300-30 MG tablet Take 1 tablet by mouth every 6 (six) hours as needed for up to 7 days for moderate pain. 30 tablet 0  . amoxicillin-clavulanate (AUGMENTIN) 875-125 MG tablet Take 1 tablet by mouth 2 (two) times daily. 20 tablet 0   No current facility-administered medications for this visit.     Allergies: Hydrocodone  Past Medical History:  Diagnosis Date  . DJD (degenerative joint disease)   . History of asthma   . History of colon polyps   . Hyperlipidemia   . Hypertension   . Insomnia     Past Surgical History:  Procedure Laterality Date  . DILATION AND CURETTAGE OF UTERUS    . OTHER SURGICAL HISTORY  09/2011   mammogram and bone density  . POLYPECTOMY    . TUBAL LIGATION      Family History  Problem Relation Age of Onset  . Cancer Mother   . Hypertension Mother   . Cancer Father     Social History   Tobacco Use  . Smoking status: Former Research scientist (life sciences)  . Smokeless tobacco: Never Used  Substance Use Topics  . Alcohol use: Yes    Comment: wine    Subjective:  Patient presents with concerns  for a dog bite which occurred this morning around 8 am; was bitten by her brother's dog- has no concerns for rabies exposure; overdue for Tdap; dog is aggressive and has bitten her in the past; this morning dog bit both of her feet and left hand/ left upper arm;   Objective:  Vitals:   08/08/17 0923  BP: (!) 150/84  Pulse: 73  Temp: 97.7 F (36.5 C)  TempSrc: Oral  SpO2: 98%  Weight: 121 lb 0.6 oz (54.9 kg)  Height: 5' (1.524 m)    General: Well developed, well nourished, in no acute distress  Skin : Warm and dry. Numerous bloody lacerations noted on left forearm/ left hand and both feet;  Head: Normocephalic and atraumatic  Lungs: Respirations unlabored; clear to auscultation bilaterally without  wheeze, rales, rhonchi  Extremities: swelling noted in both feet, no cyanosis, no clubbing  Vessels: Symmetric bilaterally  Neurologic: Alert and oriented; speech intact; face symmetrical; moves all extremities well; CNII-XII intact without focal deficit   Assessment:  1. Dog bite, initial encounter   2. Multiple wounds of skin     Plan:  Areas of concerns cleaned with iodine in the office today; area is covered with non adhesive telfa and coban; patient and daughter understand importance of keeping area cleaned; Tdap updated; Rx for Augmentin 875 mg bid x 10 days and Tylenol #3 for pain; follow-up worse, no better; otherwise, keep planned follow-up with her PCP for early July.   No follow-ups on file.  Orders Placed This Encounter  Procedures  . Tdap vaccine greater than or equal to 7yo IM    Requested Prescriptions   Signed Prescriptions Disp Refills  . amoxicillin-clavulanate (AUGMENTIN) 875-125 MG tablet 20 tablet 0    Sig: Take 1 tablet by mouth 2 (two) times daily.  Marland Kitchen acetaminophen-codeine (TYLENOL #3) 300-30 MG tablet 30 tablet 0    Sig: Take 1 tablet by mouth every 6 (six) hours as needed for up to 7 days for moderate pain.

## 2017-08-19 ENCOUNTER — Other Ambulatory Visit: Payer: Self-pay | Admitting: Internal Medicine

## 2017-08-19 ENCOUNTER — Ambulatory Visit (INDEPENDENT_AMBULATORY_CARE_PROVIDER_SITE_OTHER): Payer: Medicare Other | Admitting: Internal Medicine

## 2017-08-19 ENCOUNTER — Other Ambulatory Visit (INDEPENDENT_AMBULATORY_CARE_PROVIDER_SITE_OTHER): Payer: Medicare Other

## 2017-08-19 ENCOUNTER — Encounter: Payer: Self-pay | Admitting: Internal Medicine

## 2017-08-19 VITALS — BP 148/74 | HR 63 | Temp 98.7°F | Resp 16 | Ht 60.0 in | Wt 121.8 lb

## 2017-08-19 DIAGNOSIS — E785 Hyperlipidemia, unspecified: Secondary | ICD-10-CM

## 2017-08-19 DIAGNOSIS — W540XXD Bitten by dog, subsequent encounter: Secondary | ICD-10-CM | POA: Diagnosis not present

## 2017-08-19 DIAGNOSIS — F4321 Adjustment disorder with depressed mood: Secondary | ICD-10-CM | POA: Diagnosis not present

## 2017-08-19 DIAGNOSIS — I10 Essential (primary) hypertension: Secondary | ICD-10-CM

## 2017-08-19 DIAGNOSIS — Z0000 Encounter for general adult medical examination without abnormal findings: Secondary | ICD-10-CM

## 2017-08-19 DIAGNOSIS — W540XXA Bitten by dog, initial encounter: Secondary | ICD-10-CM | POA: Insufficient documentation

## 2017-08-19 DIAGNOSIS — F411 Generalized anxiety disorder: Secondary | ICD-10-CM

## 2017-08-19 DIAGNOSIS — S81851A Open bite, right lower leg, initial encounter: Secondary | ICD-10-CM

## 2017-08-19 DIAGNOSIS — S81852A Open bite, left lower leg, initial encounter: Secondary | ICD-10-CM

## 2017-08-19 LAB — BASIC METABOLIC PANEL
BUN: 15 mg/dL (ref 6–23)
CALCIUM: 9.5 mg/dL (ref 8.4–10.5)
CO2: 30 mEq/L (ref 19–32)
Chloride: 94 mEq/L — ABNORMAL LOW (ref 96–112)
Creatinine, Ser: 0.71 mg/dL (ref 0.40–1.20)
GFR: 83.93 mL/min (ref >60.00)
Glucose, Bld: 101 mg/dL — ABNORMAL HIGH (ref 70–99)
POTASSIUM: 3.8 meq/L (ref 3.5–5.1)
Sodium: 131 mEq/L — ABNORMAL LOW (ref 135–145)

## 2017-08-19 LAB — CBC WITH DIFFERENTIAL/PLATELET
Basophils Absolute: 0.1 10*3/uL (ref 0.0–0.1)
Basophils Relative: 0.7 % (ref 0.0–3.0)
EOS PCT: 1.9 % (ref 0.0–5.0)
Eosinophils Absolute: 0.2 10*3/uL (ref 0.0–0.7)
HEMATOCRIT: 35.6 % — AB (ref 36.0–46.0)
HEMOGLOBIN: 12.4 g/dL (ref 12.0–15.0)
LYMPHS PCT: 29.5 % (ref 12.0–46.0)
Lymphs Abs: 2.5 10*3/uL (ref 0.7–4.0)
MCHC: 34.9 g/dL (ref 30.0–36.0)
MCV: 91 fl (ref 78.0–100.0)
MONO ABS: 0.7 10*3/uL (ref 0.1–1.0)
MONOS PCT: 8.7 % (ref 3.0–12.0)
Neutro Abs: 5 10*3/uL (ref 1.4–7.7)
Neutrophils Relative %: 59.2 % (ref 43.0–77.0)
Platelets: 331 10*3/uL (ref 150.0–400.0)
RBC: 3.91 Mil/uL (ref 3.87–5.11)
RDW: 12.3 % (ref 11.5–15.5)
WBC: 8.5 10*3/uL (ref 4.0–10.5)

## 2017-08-19 LAB — URINALYSIS, ROUTINE W REFLEX MICROSCOPIC
BILIRUBIN URINE: NEGATIVE
Ketones, ur: NEGATIVE
LEUKOCYTES UA: NEGATIVE
NITRITE: NEGATIVE
Specific Gravity, Urine: 1.01 (ref 1.000–1.030)
TOTAL PROTEIN, URINE-UPE24: NEGATIVE
Urine Glucose: NEGATIVE
Urobilinogen, UA: 0.2 (ref 0.0–1.0)
pH: 7 (ref 5.0–8.0)

## 2017-08-19 LAB — LIPID PANEL
CHOLESTEROL: 127 mg/dL (ref 0–200)
HDL: 58.4 mg/dL (ref >39.00)
LDL Cholesterol: 50 mg/dL (ref 0–99)
NONHDL: 68.47
Total CHOL/HDL Ratio: 2
Triglycerides: 90 mg/dL (ref 0.0–149.0)
VLDL: 18 mg/dL (ref 0.0–40.0)

## 2017-08-19 LAB — TSH: TSH: 0.68 u[IU]/mL (ref 0.35–4.50)

## 2017-08-19 LAB — HEPATIC FUNCTION PANEL
ALT: 13 U/L (ref 0–35)
AST: 15 U/L (ref 0–37)
Albumin: 4.2 g/dL (ref 3.5–5.2)
Alkaline Phosphatase: 47 U/L (ref 39–117)
Bilirubin, Direct: 0.1 mg/dL (ref 0.0–0.3)
TOTAL PROTEIN: 6.9 g/dL (ref 6.0–8.3)
Total Bilirubin: 0.4 mg/dL (ref 0.2–1.2)

## 2017-08-19 MED ORDER — ZOLPIDEM TARTRATE 5 MG PO TABS: 5.0000 mg | ORAL_TABLET | Freq: Every evening | ORAL | 2 refills | Status: DC | PRN

## 2017-08-19 MED ORDER — METOPROLOL SUCCINATE ER 25 MG PO TB24: 25.0000 mg | ORAL_TABLET | Freq: Every day | ORAL | 3 refills | Status: DC

## 2017-08-19 MED ORDER — DIAZEPAM 5 MG PO TABS: 5.0000 mg | ORAL_TABLET | Freq: Four times a day (QID) | ORAL | 3 refills | Status: DC | PRN

## 2017-08-19 NOTE — Assessment & Plan Note (Signed)
Xanax prn  Potential benefits of a long term benzodiazepines  use as well as potential risks  and complications were explained to the patient and were aknowledged. 

## 2017-08-19 NOTE — Assessment & Plan Note (Signed)
BP Readings from Last 3 Encounters:  08/19/17 (!) 148/74  08/08/17 (!) 150/84  05/28/16 (!) 190/80

## 2017-08-19 NOTE — Patient Instructions (Addendum)
Valerian root for sleep or anxiety      Health Maintenance for Postmenopausal Women Menopause is a normal process in which your reproductive ability comes to an end. This process happens gradually over a span of months to years, usually between the ages of 9 and 80. Menopause is complete when you have missed 12 consecutive menstrual periods. It is important to talk with your health care provider about some of the most common conditions that affect postmenopausal women, such as heart disease, cancer, and bone loss (osteoporosis). Adopting a healthy lifestyle and getting preventive care can help to promote your health and wellness. Those actions can also lower your chances of developing some of these common conditions. What should I know about menopause? During menopause, you may experience a number of symptoms, such as:  Moderate-to-severe hot flashes.  Night sweats.  Decrease in sex drive.  Mood swings.  Headaches.  Tiredness.  Irritability.  Memory problems.  Insomnia.  Choosing to treat or not to treat menopausal changes is an individual decision that you make with your health care provider. What should I know about hormone replacement therapy and supplements? Hormone therapy products are effective for treating symptoms that are associated with menopause, such as hot flashes and night sweats. Hormone replacement carries certain risks, especially as you become older. If you are thinking about using estrogen or estrogen with progestin treatments, discuss the benefits and risks with your health care provider. What should I know about heart disease and stroke? Heart disease, heart attack, and stroke become more likely as you age. This may be due, in part, to the hormonal changes that your body experiences during menopause. These can affect how your body processes dietary fats, triglycerides, and cholesterol. Heart attack and stroke are both medical emergencies. There are many things  that you can do to help prevent heart disease and stroke:  Have your blood pressure checked at least every 1-2 years. High blood pressure causes heart disease and increases the risk of stroke.  If you are 28-57 years old, ask your health care provider if you should take aspirin to prevent a heart attack or a stroke.  Do not use any tobacco products, including cigarettes, chewing tobacco, or electronic cigarettes. If you need help quitting, ask your health care provider.  It is important to eat a healthy diet and maintain a healthy weight. ? Be sure to include plenty of vegetables, fruits, low-fat dairy products, and lean protein. ? Avoid eating foods that are high in solid fats, added sugars, or salt (sodium).  Get regular exercise. This is one of the most important things that you can do for your health. ? Try to exercise for at least 150 minutes each week. The type of exercise that you do should increase your heart rate and make you sweat. This is known as moderate-intensity exercise. ? Try to do strengthening exercises at least twice each week. Do these in addition to the moderate-intensity exercise.  Know your numbers.Ask your health care provider to check your cholesterol and your blood glucose. Continue to have your blood tested as directed by your health care provider.  What should I know about cancer screening? There are several types of cancer. Take the following steps to reduce your risk and to catch any cancer development as early as possible. Breast Cancer  Practice breast self-awareness. ? This means understanding how your breasts normally appear and feel. ? It also means doing regular breast self-exams. Let your health care provider know about  any changes, no matter how small.  If you are 81 or older, have a clinician do a breast exam (clinical breast exam or CBE) every year. Depending on your age, family history, and medical history, it may be recommended that you also have  a yearly breast X-ray (mammogram).  If you have a family history of breast cancer, talk with your health care provider about genetic screening.  If you are at high risk for breast cancer, talk with your health care provider about having an MRI and a mammogram every year.  Breast cancer (BRCA) gene test is recommended for women who have family members with BRCA-related cancers. Results of the assessment will determine the need for genetic counseling and BRCA1 and for BRCA2 testing. BRCA-related cancers include these types: ? Breast. This occurs in males or females. ? Ovarian. ? Tubal. This may also be called fallopian tube cancer. ? Cancer of the abdominal or pelvic lining (peritoneal cancer). ? Prostate. ? Pancreatic.  Cervical, Uterine, and Ovarian Cancer Your health care provider may recommend that you be screened regularly for cancer of the pelvic organs. These include your ovaries, uterus, and vagina. This screening involves a pelvic exam, which includes checking for microscopic changes to the surface of your cervix (Pap test).  For women ages 21-65, health care providers may recommend a pelvic exam and a Pap test every three years. For women ages 60-65, they may recommend the Pap test and pelvic exam, combined with testing for human papilloma virus (HPV), every five years. Some types of HPV increase your risk of cervical cancer. Testing for HPV may also be done on women of any age who have unclear Pap test results.  Other health care providers may not recommend any screening for nonpregnant women who are considered low risk for pelvic cancer and have no symptoms. Ask your health care provider if a screening pelvic exam is right for you.  If you have had past treatment for cervical cancer or a condition that could lead to cancer, you need Pap tests and screening for cancer for at least 20 years after your treatment. If Pap tests have been discontinued for you, your risk factors (such as  having a new sexual partner) need to be reassessed to determine if you should start having screenings again. Some women have medical problems that increase the chance of getting cervical cancer. In these cases, your health care provider may recommend that you have screening and Pap tests more often.  If you have a family history of uterine cancer or ovarian cancer, talk with your health care provider about genetic screening.  If you have vaginal bleeding after reaching menopause, tell your health care provider.  There are currently no reliable tests available to screen for ovarian cancer.  Lung Cancer Lung cancer screening is recommended for adults 9-68 years old who are at high risk for lung cancer because of a history of smoking. A yearly low-dose CT scan of the lungs is recommended if you:  Currently smoke.  Have a history of at least 30 pack-years of smoking and you currently smoke or have quit within the past 15 years. A pack-year is smoking an average of one pack of cigarettes per day for one year.  Yearly screening should:  Continue until it has been 15 years since you quit.  Stop if you develop a health problem that would prevent you from having lung cancer treatment.  Colorectal Cancer  This type of cancer can be detected and can  be prevented.  Routine colorectal cancer screening usually begins at age 50 and continues through age 75.  If you have risk factors for colon cancer, your health care provider may recommend that you be screened at an earlier age.  If you have a family history of colorectal cancer, talk with your health care provider about genetic screening.  Your health care provider may also recommend using home test kits to check for hidden blood in your stool.  A small camera at the end of a tube can be used to examine your colon directly (sigmoidoscopy or colonoscopy). This is done to check for the earliest forms of colorectal cancer.  Direct  examination of the colon should be repeated every 5-10 years until age 75. However, if early forms of precancerous polyps or small growths are found or if you have a family history or genetic risk for colorectal cancer, you may need to be screened more often.  Skin Cancer  Check your skin from head to toe regularly.  Monitor any moles. Be sure to tell your health care provider: ? About any new moles or changes in moles, especially if there is a change in a mole's shape or color. ? If you have a mole that is larger than the size of a pencil eraser.  If any of your family members has a history of skin cancer, especially at a young age, talk with your health care provider about genetic screening.  Always use sunscreen. Apply sunscreen liberally and repeatedly throughout the day.  Whenever you are outside, protect yourself by wearing long sleeves, pants, a wide-brimmed hat, and sunglasses.  What should I know about osteoporosis? Osteoporosis is a condition in which bone destruction happens more quickly than new bone creation. After menopause, you may be at an increased risk for osteoporosis. To help prevent osteoporosis or the bone fractures that can happen because of osteoporosis, the following is recommended:  If you are 19-50 years old, get at least 1,000 mg of calcium and at least 600 mg of vitamin D per day.  If you are older than age 50 but younger than age 70, get at least 1,200 mg of calcium and at least 600 mg of vitamin D per day.  If you are older than age 70, get at least 1,200 mg of calcium and at least 800 mg of vitamin D per day.  Smoking and excessive alcohol intake increase the risk of osteoporosis. Eat foods that are rich in calcium and vitamin D, and do weight-bearing exercises several times each week as directed by your health care provider. What should I know about how menopause affects my mental health? Depression may occur at any age, but it is more common as you become  older. Common symptoms of depression include:  Low or sad mood.  Changes in sleep patterns.  Changes in appetite or eating patterns.  Feeling an overall lack of motivation or enjoyment of activities that you previously enjoyed.  Frequent crying spells.  Talk with your health care provider if you think that you are experiencing depression. What should I know about immunizations? It is important that you get and maintain your immunizations. These include:  Tetanus, diphtheria, and pertussis (Tdap) booster vaccine.  Influenza every year before the flu season begins.  Pneumonia vaccine.  Shingles vaccine.  Your health care provider may also recommend other immunizations. This information is not intended to replace advice given to you by your health care provider. Make sure you discuss any questions   questions you have with your health care provider. Document Released: 03/29/2005 Document Revised: 08/25/2015 Document Reviewed: 11/08/2014 Elsevier Interactive Patient Education  2018 [employer].

## 2017-08-19 NOTE — Progress Notes (Signed)
Subjective:  Patient ID: Whitney Benson, female    DOB: January 12, 1935  Age: 83 y.o. MRN: [id number]  CC: No chief complaint on file.   HPI Whitney Benson presents for a well exam F/u dog bites on B legs C/o wt loss, grieving, anxiety. Stanley died in 08/04/17  Outpatient Medications Prior to Visit  Medication Sig Dispense Refill  . aspirin 81 MG tablet Take 81 mg by mouth daily.      Marland Kitchen atorvastatin (LIPITOR) 20 MG tablet Take 1 tablet (20 mg total) by mouth daily. 90 tablet 3  . ciprofloxacin (CIPRO) 250 MG tablet Take 1 tablet (250 mg total) by mouth 2 (two) times daily. 6 tablet 0  . diazepam (VALIUM) 5 MG tablet Take 1 tablet (5 mg total) by mouth every 6 (six) hours as needed. for anxiety 30 tablet 3  . meloxicam (MOBIC) 7.5 MG tablet Take 1 tablet (7.5 mg total) by mouth daily. 30 tablet 1  . metoprolol succinate (TOPROL-XL) 25 MG 24 hr tablet Take 1 tablet (25 mg total) by mouth daily. Annual appt is due  must see provider for future refills 30 tablet 0  . MULTIPLE VITAMIN PO Take by mouth.      . nitroGLYCERIN (NITROSTAT) 0.4 MG SL tablet Place 1 tablet (0.4 mg total) under the tongue every 5 (five) minutes as needed for chest pain. 20 tablet 1  . pantoprazole (PROTONIX) 40 MG tablet Take 1 tablet (40 mg total) by mouth daily. 30 tablet 0  . valsartan-hydrochlorothiazide (DIOVAN-HCT) 320-25 MG tablet TAKE 1 TABLET BY MOUTH DAILY 30 tablet 0  . zolpidem (AMBIEN) 5 MG tablet Take 1 tablet (5 mg total) by mouth at bedtime as needed for sleep. 30 tablet 1  . amoxicillin-clavulanate (AUGMENTIN) 875-125 MG tablet Take 1 tablet by mouth 2 (two) times daily. 20 tablet 0   No facility-administered medications prior to visit.     ROS: Review of Systems  Constitutional: Negative for activity change, appetite change, chills, fatigue and unexpected weight change.  HENT: Negative for congestion, mouth sores and sinus pressure.   Eyes: Negative for visual disturbance.  Respiratory: Negative for  cough and chest tightness.   Gastrointestinal: Negative for abdominal pain and nausea.  Genitourinary: Negative for difficulty urinating, frequency and vaginal pain.  Musculoskeletal: Negative for back pain and gait problem.  Skin: Positive for wound. Negative for pallor and rash.  Neurological: Negative for dizziness, tremors, weakness, numbness and headaches.  Psychiatric/Behavioral: Positive for sleep disturbance. Negative for confusion, dysphoric mood and suicidal ideas. The patient is nervous/anxious.     Objective:  BP (!) 148/74   Pulse 63   Temp 98.7 F (37.1 C) (Oral)   Resp 16   Ht 5' (1.524 m)   Wt 121 lb 12.8 oz (55.2 kg)   SpO2 98%   BMI 23.79 kg/m   BP Readings from Last 3 Encounters:  08/19/17 (!) 148/74  08/08/17 (!) 150/84  05/28/16 (!) 190/80    Wt Readings from Last 3 Encounters:  08/19/17 121 lb 12.8 oz (55.2 kg)  08/08/17 121 lb 0.6 oz (54.9 kg)  05/28/16 127 lb 1.9 oz (57.7 kg)    Physical Exam  Constitutional: She appears well-developed. No distress.  HENT:  Head: Normocephalic.  Right Ear: External ear normal.  Left Ear: External ear normal.  Nose: Nose normal.  Mouth/Throat: Oropharynx is clear and moist.  Eyes: Pupils are equal, round, and reactive to light. Conjunctivae are normal. Right eye exhibits no discharge.  Left eye exhibits no discharge.  Neck: Normal range of motion. Neck supple. No JVD present. No tracheal deviation present. No thyromegaly present.  Cardiovascular: Normal rate, regular rhythm and normal heart sounds.  Pulmonary/Chest: No stridor. No respiratory distress. She has no wheezes.  Abdominal: Soft. Bowel sounds are normal. She exhibits no distension and no mass. There is no tenderness. There is no rebound and no guarding.  Musculoskeletal: She exhibits no edema or tenderness.  Lymphadenopathy:    She has no cervical adenopathy.  Neurological: She displays normal reflexes. No cranial nerve deficit. She exhibits normal  muscle tone. Coordination normal.  Skin: No rash noted. No erythema.  Psychiatric: Her behavior is normal. Judgment and thought content normal.  healing wounds on B legs  Lab Results  Component Value Date   WBC 5.6 03/01/2015   HGB 13.1 03/01/2015   HCT 39.8 03/01/2015   PLT 248.0 03/01/2015   GLUCOSE 96 05/24/2016   CHOL 145 05/24/2016   TRIG 163.0 (H) 05/24/2016   HDL 46.10 05/24/2016   LDLDIRECT 84.1 07/20/2012   LDLCALC 66 05/24/2016   ALT 14 05/24/2016   AST 17 05/24/2016   NA 135 05/24/2016   K 3.9 05/24/2016   CL 99 05/24/2016   CREATININE 0.71 05/24/2016   BUN 9 05/24/2016   CO2 30 05/24/2016   TSH 0.95 05/24/2016    Mm Diag Breast Tomo Uni Right  Result Date: 01/23/2017 CLINICAL DATA:  Screening recall for a possible mass in the right breast.Patient has no current complaints. EXAM: 2D DIGITAL DIAGNOSTIC UNILATERAL RIGHT MAMMOGRAM WITH CAD AND ADJUNCT TOMO COMPARISON:  Previous exam(s). ACR Breast Density Category b: There are scattered areas of fibroglandular density. FINDINGS: On the diagnostic images, the possible mass is not reproduced. There are no discrete masses, areas of significant asymmetry, areas of architectural distortion or suspicious calcifications. The possible mass seen on the screening study was due to superimposed fibroglandular tissue. Mammographic images were processed with CAD. IMPRESSION: No evidence of breast malignancy. RECOMMENDATION: Screening mammogram in one year.(Code:SM-B-01Y) I have discussed the findings and recommendations with the patient. Results were also provided in writing at the conclusion of the visit. If applicable, a reminder letter will be sent to the patient regarding the next appointment. BI-RADS CATEGORY  1: Negative. Electronically Signed   By: Lajean Manes M.D.   On: 01/23/2017 10:22    Assessment & Plan:   There are no diagnoses linked to this encounter.   No orders of the defined types were placed in this  encounter.    Follow-up: No follow-ups on file.  Walker Kehr, MD

## 2017-08-19 NOTE — Assessment & Plan Note (Signed)
Lipitor 

## 2017-08-19 NOTE — Assessment & Plan Note (Signed)
B LEs - healing

## 2017-08-19 NOTE — Assessment & Plan Note (Signed)
Here for medicare wellness/physical  Diet: heart healthy  Physical activity: not sedentary  Depression/mood screen: anxiety, grief Hearing: intact to whispered voice  Visual acuity: grossly normal, performs annual eye exam  ADLs: capable  Fall risk: low to none  Home safety: good  Cognitive evaluation: intact to orientation, naming, recall and repetition  EOL planning: adv directives, full code/ I agree  I have personally reviewed and have noted  1. The patient's medical, surgical and social history  2. Their use of alcohol, tobacco or illicit drugs  3. Their current medications and supplements  4. The patient's functional ability including ADL's, fall risks, home safety risks and hearing or visual impairment.  5. Diet and physical activities  6. Evidence for depression or mood disorders 7. The roster of all physicians providing medical care to patient - is listed in the Snapshot section of the chart and reviewed today.    Today patient counseled on age appropriate routine health concerns for screening and prevention, each reviewed and up to date or declined. Immunizations reviewed and up to date or declined. Labs ordered and reviewed. Risk factors for depression reviewed and negative. Hearing function and visual acuity are intact. ADLs screened and addressed as needed. Functional ability and level of safety reviewed and appropriate. Education, counseling and referrals performed based on assessed risks today. Patient provided with a copy of personalized plan for preventive services.

## 2017-08-19 NOTE — Assessment & Plan Note (Signed)
Discussed RTC 2 mo

## 2017-10-06 ENCOUNTER — Other Ambulatory Visit: Payer: Self-pay | Admitting: Internal Medicine

## 2017-10-27 ENCOUNTER — Encounter: Payer: Self-pay | Admitting: Internal Medicine

## 2017-10-27 ENCOUNTER — Ambulatory Visit: Payer: Medicare Other | Admitting: Internal Medicine

## 2017-10-27 VITALS — BP 130/78 | HR 64 | Temp 97.9°F | Ht 60.0 in | Wt 124.0 lb

## 2017-10-27 DIAGNOSIS — I10 Essential (primary) hypertension: Secondary | ICD-10-CM | POA: Diagnosis not present

## 2017-10-27 DIAGNOSIS — F4321 Adjustment disorder with depressed mood: Secondary | ICD-10-CM | POA: Diagnosis not present

## 2017-10-27 DIAGNOSIS — F418 Other specified anxiety disorders: Secondary | ICD-10-CM

## 2017-10-27 DIAGNOSIS — F411 Generalized anxiety disorder: Secondary | ICD-10-CM

## 2017-10-27 DIAGNOSIS — Z23 Encounter for immunization: Secondary | ICD-10-CM | POA: Diagnosis not present

## 2017-10-27 MED ORDER — PAROXETINE HCL 10 MG PO TABS: 10.0000 mg | ORAL_TABLET | Freq: Every day | ORAL | 5 refills | Status: DC

## 2017-10-27 NOTE — Assessment & Plan Note (Signed)
Start Paxil 10 mg/d

## 2017-10-27 NOTE — Assessment & Plan Note (Signed)
BP Readings from Last 3 Encounters:  10/27/17 130/78  08/19/17 (!) 148/74  08/08/17 (!) 150/84

## 2017-10-27 NOTE — Assessment & Plan Note (Signed)
Discussed.

## 2017-10-27 NOTE — Progress Notes (Signed)
Subjective:  Patient ID: Whitney Benson, female    DOB: 08/28/1934  Age: 83 y.o. MRN: [id number]  CC: No chief complaint on file.   HPI Whitney Benson presents for HTN, grief, insomnia  Outpatient Medications Prior to Visit  Medication Sig Dispense Refill  . aspirin 81 MG tablet Take 81 mg by mouth daily.      Marland Kitchen atorvastatin (LIPITOR) 20 MG tablet Take 1 tablet (20 mg total) by mouth daily. 90 tablet 3  . diazepam (VALIUM) 5 MG tablet Take 1 tablet (5 mg total) by mouth every 6 (six) hours as needed for anxiety. Do not take with zolpidem 30 tablet 3  . meloxicam (MOBIC) 7.5 MG tablet Take 1 tablet (7.5 mg total) by mouth daily. 30 tablet 1  . metoprolol succinate (TOPROL-XL) 25 MG 24 hr tablet Take 1 tablet (25 mg total) by mouth daily. Annual appt is due  must see provider for future refills 90 tablet 3  . MULTIPLE VITAMIN PO Take by mouth.      . nitroGLYCERIN (NITROSTAT) 0.4 MG SL tablet Place 1 tablet (0.4 mg total) under the tongue every 5 (five) minutes as needed for chest pain. 20 tablet 1  . pantoprazole (PROTONIX) 40 MG tablet Take 1 tablet (40 mg total) by mouth daily. 30 tablet 0  . valsartan-hydrochlorothiazide (DIOVAN-HCT) 320-25 MG tablet TAKE 1 TABLET BY MOUTH DAILY 30 tablet 5  . zolpidem (AMBIEN) 5 MG tablet Take 1 tablet (5 mg total) by mouth at bedtime as needed for sleep. 30 tablet 2   No facility-administered medications prior to visit.     ROS: Review of Systems  Constitutional: Negative for activity change, appetite change, chills, fatigue and unexpected weight change.  HENT: Negative for congestion, mouth sores and sinus pressure.   Eyes: Negative for visual disturbance.  Respiratory: Negative for cough and chest tightness.   Gastrointestinal: Negative for abdominal pain and nausea.  Genitourinary: Negative for difficulty urinating, frequency and vaginal pain.  Musculoskeletal: Negative for back pain and gait problem.  Skin: Negative for pallor and rash.    Neurological: Negative for dizziness, tremors, weakness, numbness and headaches.  Psychiatric/Behavioral: Positive for sleep disturbance. Negative for confusion and suicidal ideas. The patient is nervous/anxious.     Objective:  BP 130/78 (BP Location: Left Arm, Patient Position: Sitting, Cuff Size: Normal)   Pulse 64   Temp 97.9 F (36.6 C) (Oral)   Ht 5' (1.524 m)   Wt 124 lb (56.2 kg)   SpO2 96%   BMI 24.22 kg/m   BP Readings from Last 3 Encounters:  10/27/17 130/78  08/19/17 (!) 148/74  08/08/17 (!) 150/84    Wt Readings from Last 3 Encounters:  10/27/17 124 lb (56.2 kg)  08/19/17 121 lb 12.8 oz (55.2 kg)  08/08/17 121 lb 0.6 oz (54.9 kg)    Physical Exam  Constitutional: She appears well-developed. No distress.  HENT:  Head: Normocephalic.  Right Ear: External ear normal.  Left Ear: External ear normal.  Nose: Nose normal.  Mouth/Throat: Oropharynx is clear and moist.  Eyes: Pupils are equal, round, and reactive to light. Conjunctivae are normal. Right eye exhibits no discharge. Left eye exhibits no discharge.  Neck: Normal range of motion. Neck supple. No JVD present. No tracheal deviation present. No thyromegaly present.  Cardiovascular: Normal rate, regular rhythm and normal heart sounds.  Pulmonary/Chest: No stridor. No respiratory distress. She has no wheezes.  Abdominal: Soft. Bowel sounds are normal. She exhibits no distension  and no mass. There is no tenderness. There is no rebound and no guarding.  Musculoskeletal: She exhibits no edema or tenderness.  Lymphadenopathy:    She has no cervical adenopathy.  Neurological: She displays normal reflexes. No cranial nerve deficit. She exhibits normal muscle tone. Coordination normal.  Skin: No rash noted. No erythema.  Psychiatric: She has a normal mood and affect. Her behavior is normal. Judgment and thought content normal.  a/oc/c  Lab Results  Component Value Date   WBC 8.5 08/19/2017   HGB 12.4  08/19/2017   HCT 35.6 (L) 08/19/2017   PLT 331.0 08/19/2017   GLUCOSE 101 (H) 08/19/2017   CHOL 127 08/19/2017   TRIG 90.0 08/19/2017   HDL 58.40 08/19/2017   LDLDIRECT 84.1 07/20/2012   LDLCALC 50 08/19/2017   ALT 13 08/19/2017   AST 15 08/19/2017   NA 131 (L) 08/19/2017   K 3.8 08/19/2017   CL 94 (L) 08/19/2017   CREATININE 0.71 08/19/2017   BUN 15 08/19/2017   CO2 30 08/19/2017   TSH 0.68 08/19/2017    Mm Diag Breast Tomo Uni Right  Result Date: 01/23/2017 CLINICAL DATA:  Screening recall for a possible mass in the right breast.Patient has no current complaints. EXAM: 2D DIGITAL DIAGNOSTIC UNILATERAL RIGHT MAMMOGRAM WITH CAD AND ADJUNCT TOMO COMPARISON:  Previous exam(s). ACR Breast Density Category b: There are scattered areas of fibroglandular density. FINDINGS: On the diagnostic images, the possible mass is not reproduced. There are no discrete masses, areas of significant asymmetry, areas of architectural distortion or suspicious calcifications. The possible mass seen on the screening study was due to superimposed fibroglandular tissue. Mammographic images were processed with CAD. IMPRESSION: No evidence of breast malignancy. RECOMMENDATION: Screening mammogram in one year.(Code:SM-B-01Y) I have discussed the findings and recommendations with the patient. Results were also provided in writing at the conclusion of the visit. If applicable, a reminder letter will be sent to the patient regarding the next appointment. BI-RADS CATEGORY  1: Negative. Electronically Signed   By: Lajean Manes M.D.   On: 01/23/2017 10:22    Assessment & Plan:   There are no diagnoses linked to this encounter.   No orders of the defined types were placed in this encounter.    Follow-up: No follow-ups on file.  Walker Kehr, MD

## 2017-11-28 ENCOUNTER — Other Ambulatory Visit: Payer: Self-pay | Admitting: Internal Medicine

## 2017-11-28 NOTE — Telephone Encounter (Signed)
Please advise about refill in Dr. Plotnikovs absence. 

## 2017-12-01 ENCOUNTER — Emergency Department (HOSPITAL_COMMUNITY): Payer: Medicare Other

## 2017-12-01 ENCOUNTER — Ambulatory Visit: Payer: Self-pay

## 2017-12-01 ENCOUNTER — Emergency Department (HOSPITAL_COMMUNITY)
Admission: EM | Admit: 2017-12-01 | Discharge: 2017-12-01 | Disposition: A | Discharge status: Home / Self Care | Payer: Medicare Other | Attending: Emergency Medicine | Admitting: Emergency Medicine

## 2017-12-01 ENCOUNTER — Encounter (HOSPITAL_COMMUNITY): Payer: Self-pay

## 2017-12-01 DIAGNOSIS — W109XXA Fall (on) (from) unspecified stairs and steps, initial encounter: Secondary | ICD-10-CM | POA: Diagnosis not present

## 2017-12-01 DIAGNOSIS — S199XXA Unspecified injury of neck, initial encounter: Secondary | ICD-10-CM | POA: Diagnosis not present

## 2017-12-01 DIAGNOSIS — Y939 Activity, unspecified: Secondary | ICD-10-CM | POA: Diagnosis not present

## 2017-12-01 DIAGNOSIS — Y998 Other external cause status: Secondary | ICD-10-CM | POA: Diagnosis not present

## 2017-12-01 DIAGNOSIS — J45909 Unspecified asthma, uncomplicated: Secondary | ICD-10-CM | POA: Diagnosis not present

## 2017-12-01 DIAGNOSIS — Z87891 Personal history of nicotine dependence: Secondary | ICD-10-CM | POA: Diagnosis not present

## 2017-12-01 DIAGNOSIS — E785 Hyperlipidemia, unspecified: Secondary | ICD-10-CM | POA: Diagnosis not present

## 2017-12-01 DIAGNOSIS — Z79899 Other long term (current) drug therapy: Secondary | ICD-10-CM | POA: Diagnosis not present

## 2017-12-01 DIAGNOSIS — I10 Essential (primary) hypertension: Secondary | ICD-10-CM | POA: Diagnosis not present

## 2017-12-01 DIAGNOSIS — Y929 Unspecified place or not applicable: Secondary | ICD-10-CM | POA: Insufficient documentation

## 2017-12-01 DIAGNOSIS — Z7982 Long term (current) use of aspirin: Secondary | ICD-10-CM | POA: Insufficient documentation

## 2017-12-01 DIAGNOSIS — S0990XA Unspecified injury of head, initial encounter: Secondary | ICD-10-CM | POA: Diagnosis not present

## 2017-12-01 DIAGNOSIS — M542 Cervicalgia: Secondary | ICD-10-CM | POA: Diagnosis not present

## 2017-12-01 MED ORDER — TRAMADOL-ACETAMINOPHEN 37.5-325 MG PO TABS: 1.0000 | ORAL_TABLET | Freq: Four times a day (QID) | ORAL | 0 refills | Status: DC | PRN

## 2017-12-01 MED ORDER — TRAMADOL HCL 50 MG PO TABS
50.0000 mg | ORAL_TABLET | Freq: Once | ORAL | Status: AC
  Administered 2017-12-01: 50 mg via ORAL
  Filled 2017-12-01: qty 1

## 2017-12-01 NOTE — ED Provider Notes (Signed)
Whitney Benson   CSN: [id number] Arrival date & time: 12/01/17  4742     History   Chief Complaint Chief Complaint  Patient presents with  . Hypertension  . Fall  . Neck Pain    HPI Whitney Benson is a 83 y.o. female.  HPI  Is here for evaluation of high blood pressure which she noticed at home.  She contacted her PCP who advised she come here.  She also complains of pain in her head and neck after a fall, 3 days ago.  She fell when she was going down steps, missed the last one and fell backwards striking her head.  She did not lose consciousness.  She was able to walk afterwards.  She has had persistent pain since, without associated blurred vision, dizziness, weakness or trouble walking.  There is been no fever, chills, nausea or vomiting.  She recently stopped taking her Paxil because she felt like she was taking too many medications.  She reports having stress because of such things as a rash, and thinking of her husband who passed away several months ago.  There are no other known modifying factors.  Past Medical History:  Diagnosis Date  . DJD (degenerative joint disease)   . History of asthma   . History of colon polyps   . Hyperlipidemia   . Hypertension   . Insomnia     Patient Active Problem List   Diagnosis Date Noted  . Grief 08/19/2017  . Dog bite 08/19/2017  . Lower respiratory infection 05/21/2016  . Anxiety state 03/09/2015  . Chest pain 03/01/2015  . GERD (gastroesophageal reflux disease) 03/01/2015  . Pain in joint, shoulder region 01/06/2014  . Myalgia and myositis 12/28/2013  . CAP (community acquired pneumonia) 12/16/2013  . Well adult exam 07/23/2013  . Routine health maintenance 11/13/2010  . UNSPECIFIED NEURALGIA NEURITIS AND RADICULITIS 04/28/2007  . BACK PAIN, LUMBAR 03/27/2007  . POLYP, COLON 03/17/2007  . Dyslipidemia 03/17/2007  . Situational anxiety 03/17/2007  . Essential hypertension  03/17/2007  . Asthma 03/17/2007  . DEGENERATIVE JOINT DISEASE 03/17/2007  . INSOMNIA 03/17/2007  . DILATION AND CURETTAGE, HX OF 03/17/2007    Past Surgical History:  Procedure Laterality Date  . DILATION AND CURETTAGE OF UTERUS    . OTHER SURGICAL HISTORY  09/2011   mammogram and bone density  . POLYPECTOMY    . TUBAL LIGATION       OB History   None      Home Medications    Prior to Admission medications   Medication Sig Start Date End Date Taking? Authorizing Provider  aspirin 81 MG tablet Take 81 mg by mouth daily.      [provider]  atorvastatin (LIPITOR) 20 MG tablet Take 1 tablet (20 mg total) by mouth daily. 05/26/17   Plotnikov, Evie Lacks, MD  diazepam (VALIUM) 5 MG tablet Take 1 tablet (5 mg total) by mouth every 6 (six) hours as needed for anxiety. Do not take with zolpidem 08/19/17   Plotnikov, Evie Lacks, MD  meloxicam (MOBIC) 7.5 MG tablet Take 1 tablet (7.5 mg total) by mouth daily. 12/09/16   Plotnikov, Evie Lacks, MD  metoprolol succinate (TOPROL-XL) 25 MG 24 hr tablet Take 1 tablet (25 mg total) by mouth daily. Annual appt is due  must see provider for future refills 08/19/17   Plotnikov, Evie Lacks, MD  MULTIPLE VITAMIN PO Take by mouth.      [provider]  nitroGLYCERIN (NITROSTAT) 0.4 MG SL tablet Place 1 tablet (0.4 mg total) under the tongue every 5 (five) minutes as needed for chest pain. 03/01/15   Plotnikov, Evie Lacks, MD  pantoprazole (PROTONIX) 40 MG tablet Take 1 tablet (40 mg total) by mouth daily. 05/21/16   Plotnikov, Evie Lacks, MD  PARoxetine (PAXIL) 10 MG tablet Take 1 tablet (10 mg total) by mouth daily. 10/27/17   Plotnikov, Evie Lacks, MD  traMADol-acetaminophen (ULTRACET) 37.5-325 MG tablet Take 1 tablet by mouth every 6 (six) hours as needed for moderate pain. 12/01/17   Daleen Bo, MD  valsartan-hydrochlorothiazide (DIOVAN-HCT) 320-25 MG tablet TAKE 1 TABLET BY MOUTH DAILY 10/06/17   Plotnikov, Evie Lacks, MD  zolpidem (AMBIEN) 5  MG tablet TAKE 1 TABLET BY MOUTH EVERY NIGHT AT BEDTIME AS NEEDED FOR SLEEP 11/28/17   Marrian Salvage, FNP    Family History Family History  Problem Relation Age of Onset  . Cancer Mother   . Hypertension Mother   . Cancer Father     Social History Social History   Tobacco Use  . Smoking status: Former Research scientist (life sciences)  . Smokeless tobacco: Never Used  Substance Use Topics  . Alcohol use: Yes    Comment: wine  . Drug use: No     Allergies   Hydrocodone   Review of Systems Review of Systems  All other systems reviewed and are negative.    Physical Exam Updated Vital Signs BP (!) 188/73 (BP Location: Left Arm)   Pulse (!) 50   Temp 97.8 F (36.6 C) (Oral)   Resp 20   Ht 5\' 1"  (1.549 m)   Wt 56.7 kg   SpO2 100%   BMI 23.62 kg/m   Physical Exam  Constitutional: She is oriented to person, place, and time. She appears well-developed and well-nourished.  HENT:  Head: Normocephalic and atraumatic.  Eyes: Pupils are equal, round, and reactive to light. Conjunctivae and EOM are normal.  Neck: Normal range of motion and phonation normal. Neck supple.  Cardiovascular: Normal rate and regular rhythm.  Pulmonary/Chest: Effort normal and breath sounds normal. She exhibits no tenderness.  Abdominal: Soft. She exhibits no distension. There is no tenderness. There is no guarding.  Musculoskeletal: Normal range of motion.  Normal strength arms and legs bilaterally.  Normal gait.  Mild tenderness lower neck without palpable deformity of the cervical spine.  No tenderness of the back.  Neurological: She is alert and oriented to person, place, and time. She exhibits normal muscle tone.  No dysarthria, or aphasia.  Skin: Skin is warm and dry.  Psychiatric: She has a normal mood and affect. Her behavior is normal. Judgment and thought content normal.  Nursing Benson and vitals reviewed.    ED Treatments / Results  Labs (all labs ordered are listed, but only abnormal results  are displayed) Labs Reviewed - No data to display  EKG None  Radiology Ct Head Wo Contrast  Result Date: 12/01/2017 CLINICAL DATA:  Fall 3 days ago, hit head.  Posterior neck pain. EXAM: CT HEAD WITHOUT CONTRAST CT CERVICAL SPINE WITHOUT CONTRAST TECHNIQUE: Multidetector CT imaging of the head and cervical spine was performed following the standard protocol without intravenous contrast. Multiplanar CT image reconstructions of the cervical spine were also generated. COMPARISON:  None. FINDINGS: CT HEAD FINDINGS Brain: Diffuse age related volume loss. No acute intracranial abnormality. Specifically, no hemorrhage, hydrocephalus, mass lesion, acute infarction, or significant intracranial injury. Vascular: No hyperdense vessel or unexpected calcification. Skull:  No acute calvarial abnormality. Sinuses/Orbits: Visualized paranasal sinuses and mastoids clear. Orbital soft tissues unremarkable. Other: None CT CERVICAL SPINE FINDINGS Alignment: Slight retrolisthesis of C5 on C6 related to facet disease. Skull base and vertebrae: No acute fracture. No primary bone lesion or focal pathologic process. Soft tissues and spinal canal: No prevertebral fluid or swelling. No visible canal hematoma. Disc levels: Degenerative disc disease changes with disc space loss and spurring at C5-6 and C6-7. Mild diffuse degenerative facet disease. Upper chest: No acute findings Other: None IMPRESSION: No acute intracranial abnormality. Mild cervical spondylosis.  No acute bony abnormality. Electronically Signed   By: Rolm Baptise M.D.   On: 12/01/2017 11:12   Ct Cervical Spine Wo Contrast  Result Date: 12/01/2017 CLINICAL DATA:  Fall 3 days ago, hit head.  Posterior neck pain. EXAM: CT HEAD WITHOUT CONTRAST CT CERVICAL SPINE WITHOUT CONTRAST TECHNIQUE: Multidetector CT imaging of the head and cervical spine was performed following the standard protocol without intravenous contrast. Multiplanar CT image reconstructions of the  cervical spine were also generated. COMPARISON:  None. FINDINGS: CT HEAD FINDINGS Brain: Diffuse age related volume loss. No acute intracranial abnormality. Specifically, no hemorrhage, hydrocephalus, mass lesion, acute infarction, or significant intracranial injury. Vascular: No hyperdense vessel or unexpected calcification. Skull: No acute calvarial abnormality. Sinuses/Orbits: Visualized paranasal sinuses and mastoids clear. Orbital soft tissues unremarkable. Other: None CT CERVICAL SPINE FINDINGS Alignment: Slight retrolisthesis of C5 on C6 related to facet disease. Skull base and vertebrae: No acute fracture. No primary bone lesion or focal pathologic process. Soft tissues and spinal canal: No prevertebral fluid or swelling. No visible canal hematoma. Disc levels: Degenerative disc disease changes with disc space loss and spurring at C5-6 and C6-7. Mild diffuse degenerative facet disease. Upper chest: No acute findings Other: None IMPRESSION: No acute intracranial abnormality. Mild cervical spondylosis.  No acute bony abnormality. Electronically Signed   By: Rolm Baptise M.D.   On: 12/01/2017 11:12    Procedures Procedures (including critical care time)  Medications Ordered in ED Medications  traMADol (ULTRAM) tablet 50 mg (50 mg Oral Given 12/01/17 1121)     Initial Impression / Assessment and Plan / ED Course  I have reviewed the triage vital signs and the nursing notes.  Pertinent labs & imaging results that were available during my care of the patient were reviewed by me and considered in my medical decision making (see chart for details).      Patient Vitals for the past 24 hrs:  BP Temp Temp src Pulse Resp SpO2 Height Weight  12/01/17 1215 (!) 188/73 97.8 F (36.6 C) Oral (!) 50 20 100 % - -  12/01/17 1200 (!) 186/68 - - (!) 48 - 99 % - -  12/01/17 1144 (!) 196/82 (!) 97.5 F (36.4 C) - (!) 52 18 99 % - -  12/01/17 0957 - - - - - - 5\' 1"  (1.549 m) 56.7 kg  12/01/17 0952 (!)  203/90 97.8 F (36.6 C) Oral 62 15 100 % - -    At discharge- reevaluation with update and discussion. After initial assessment and treatment, an updated evaluation reveals she remains comfortable has no further complaints.  Findings discussed with the patient and her daughter, all questions were answered. Daleen Bo   Medical Decision Making: Fall without serious injury.  Doubt significant injury, cervical spine fracture, spinal myelopathy or cervical radiculopathy.  Mild hypertension currently under treatment, doubt hypertensive urgency.  CRITICAL CARE-no Performed by: Daleen Bo  Nursing Notes  Reviewed/ Care Coordinated Applicable Imaging Reviewed Interpretation of Laboratory Data incorporated into ED treatment  The patient appears reasonably screened and/or stabilized for discharge and I doubt any other medical condition or other [facility] requiring further screening, evaluation, or treatment in the ED at this time prior to discharge.  Plan: Home Medications-continue usual medications; Home Treatments-rest, fluids; return here if the recommended treatment, does not improve the symptoms; Recommended follow up-PCP, PRN   Final Clinical Impressions(s) / ED Diagnoses   Final diagnoses:  Injury of head, initial encounter  Neck pain  Hypertension, unspecified type    ED Discharge Orders         Ordered    traMADol-acetaminophen (ULTRACET) 37.5-325 MG tablet  Every 6 hours PRN     12/01/17 1212           Daleen Bo, MD 12/01/17 2014

## 2017-12-01 NOTE — Discharge Instructions (Addendum)
The testing today is reassuring.  Use heat on the sore area of your neck 3 or 4 times a day as needed to help control the pain.  We sent a prescription for pain reliever to your pharmacy.  See your doctor about your elevated blood pressure.  Stay on a low-salt diet.  Return here, if needed.

## 2017-12-01 NOTE — Telephone Encounter (Signed)
Pt called with C/O high BP. This am 225/181 rt arm and 202/95 lt arm.  She states that she had a fall two days ago as well and her head feels heavy on her neck. Her neck is sore from her fall.  Pt denies headache. Per patient request the flow coordinator was called to discuss the disposition. Per Sam pt need to be seen at the ED as per the protocol. Pt states that her son will drive her. Care advice read to patient. Pt verbalized understanding of all. Reason for Disposition . [2] Systolic BP  >= 334 OR Diastolic >= 356 AND [8] cardiac or neurologic symptoms (e.g., chest pain, difficulty breathing, unsteady gait, blurred vision)    Flow Coordinator contacted at pt request to see physician. Per Sam follow protocol.  Answer Assessment - Initial Assessment Questions 1. BLOOD PRESSURE: "What is the blood pressure?" "Did you take at least two measurements 5 minutes apart?"     202/95 lt arm 225/181 rt arm 2. ONSET: "When did you take your blood pressure?"    Just a few minutes ago 3. HOW: "How did you obtain the blood pressure?" (e.g., visiting nurse, automatic home BP monitor)     Home BP 4. HISTORY: "Do you have a history of high blood pressure?"     yes 5. MEDICATIONS: "Are you taking any medications for blood pressure?" "Have you missed any doses recently?"      no 6. OTHER SYMPTOMS: "Do you have any symptoms?" (e.g., headache, chest pain, blurred vision, difficulty breathing, weakness)     No head is heavy on her neck 7. PREGNANCY: "Is there any chance you are pregnant?" "When was your last menstrual period?"     N/A  Protocols used: HIGH BLOOD PRESSURE-A-AH

## 2017-12-01 NOTE — ED Triage Notes (Addendum)
Patient c/o intermittent hypertension. Patient reports that her husband died in 07-20-2022 and has been stressed and anxious at times. Patient 's daughter reports that the patient has been on Paxil and stopped "cold [place] " last week.  Patient reports that she had a fall 3 days ago coming down the stairs and fell, hitting her head. Patient c/o posterior neck pain.

## 2017-12-02 ENCOUNTER — Ambulatory Visit: Payer: Self-pay

## 2017-12-02 ENCOUNTER — Ambulatory Visit: Payer: Medicare Other | Admitting: Family

## 2017-12-02 ENCOUNTER — Encounter: Payer: Self-pay | Admitting: Family

## 2017-12-02 VITALS — BP 168/82 | HR 58 | Temp 97.7°F | Ht 61.0 in | Wt 123.1 lb

## 2017-12-02 DIAGNOSIS — I10 Essential (primary) hypertension: Secondary | ICD-10-CM | POA: Diagnosis not present

## 2017-12-02 DIAGNOSIS — F411 Generalized anxiety disorder: Secondary | ICD-10-CM | POA: Diagnosis not present

## 2017-12-02 NOTE — Telephone Encounter (Signed)
Pt called today after being seen yesterday in the ED for high BP and a fall over the weekend. Sent by triage protocol.  Pt reports that they only scanned her neck and told her to see her PCP for BP. Today BP is 223/87 per home monitor. Pt is stressed by this BP. Pt has appointment scheduled per protocol. Care advice read to patient.  Pt verbalized understanding of all.  Reason for Disposition . [5] Systolic BP  >= 732 OR Diastolic >= 202  AND [5] having NO cardiac or neurologic symptoms  Answer Assessment - Initial Assessment Questions 1. BLOOD PRESSURE: "What is the blood pressure?" "Did you take at least two measurements 5 minutes apart?"     223/87 2. ONSET: "When did you take your blood pressure?"   This morning 3. HOW: "How did you obtain the blood pressure?" (e.g., visiting nurse, automatic home BP monitor)     home 4. HISTORY: "Do you have a history of high blood pressure?"     Htn 5. MEDICATIONS: "Are you taking any medications for blood pressure?" "Have you missed any doses recently?"     no 6. OTHER SYMPTOMS: "Do you have any symptoms?" (e.g., headache, chest pain, blurred vision, difficulty breathing, weakness)     Sore neck 7. PREGNANCY: "Is there any chance you are pregnant?" "When was your last menstrual period?"     N/A  Protocols used: HIGH BLOOD PRESSURE-A-AH

## 2017-12-02 NOTE — Progress Notes (Signed)
Whitney Benson is a 83 y.o. female with the following history as recorded in [employer]:  Patient Active Problem List   Diagnosis Date Noted  . Grief 08/19/2017  . Dog bite 08/19/2017  . Lower respiratory infection 05/21/2016  . Anxiety state 03/09/2015  . Chest pain 03/01/2015  . GERD (gastroesophageal reflux disease) 03/01/2015  . Pain in joint, shoulder region 01/06/2014  . Myalgia and myositis 12/28/2013  . CAP (community acquired pneumonia) 12/16/2013  . Well adult exam 07/23/2013  . Routine health maintenance 11/13/2010  . UNSPECIFIED NEURALGIA NEURITIS AND RADICULITIS 04/28/2007  . BACK PAIN, LUMBAR 03/27/2007  . POLYP, COLON 03/17/2007  . Dyslipidemia 03/17/2007  . Situational anxiety 03/17/2007  . Essential hypertension 03/17/2007  . Asthma 03/17/2007  . DEGENERATIVE JOINT DISEASE 03/17/2007  . INSOMNIA 03/17/2007  . DILATION AND CURETTAGE, HX OF 03/17/2007    Current Outpatient Medications  Medication Sig Dispense Refill  . aspirin 81 MG tablet Take 81 mg by mouth daily.      Marland Kitchen atorvastatin (LIPITOR) 20 MG tablet Take 1 tablet (20 mg total) by mouth daily. 90 tablet 3  . cholecalciferol (VITAMIN D) 1000 units tablet Take 1,000 Units by mouth daily.    . diazepam (VALIUM) 5 MG tablet Take 1 tablet (5 mg total) by mouth every 6 (six) hours as needed for anxiety. Do not take with zolpidem 30 tablet 3  . glucosamine-chondroitin 500-400 MG tablet Take 1 tablet by mouth daily.    . metoprolol succinate (TOPROL-XL) 25 MG 24 hr tablet Take 1 tablet (25 mg total) by mouth daily. Annual appt is due  must see provider for future refills 90 tablet 3  . MULTIPLE VITAMIN PO Take by mouth.      . nitroGLYCERIN (NITROSTAT) 0.4 MG SL tablet Place 1 tablet (0.4 mg total) under the tongue every 5 (five) minutes as needed for chest pain. 20 tablet 1  . PARoxetine (PAXIL) 10 MG tablet Take 1 tablet (10 mg total) by mouth daily. 30 tablet 5  . traMADol-acetaminophen (ULTRACET) 37.5-325 MG  tablet Take 1 tablet by mouth every 6 (six) hours as needed for moderate pain. 20 tablet 0  . valsartan-hydrochlorothiazide (DIOVAN-HCT) 320-25 MG tablet TAKE 1 TABLET BY MOUTH DAILY 30 tablet 5  . zolpidem (AMBIEN) 5 MG tablet TAKE 1 TABLET BY MOUTH EVERY NIGHT AT BEDTIME AS NEEDED FOR SLEEP 30 tablet 0   No current facility-administered medications for this visit.     Allergies: Hydrocodone  Past Medical History:  Diagnosis Date  . DJD (degenerative joint disease)   . History of asthma   . History of colon polyps   . Hyperlipidemia   . Hypertension   . Insomnia     Past Surgical History:  Procedure Laterality Date  . DILATION AND CURETTAGE OF UTERUS    . OTHER SURGICAL HISTORY  09/2011   mammogram and bone density  . POLYPECTOMY    . TUBAL LIGATION      Family History  Problem Relation Age of Onset  . Cancer Mother   . Hypertension Mother   . Cancer Father     Social History   Tobacco Use  . Smoking status: Former Research scientist (life sciences)  . Smokeless tobacco: Never Used  Substance Use Topics  . Alcohol use: Yes    Comment: wine    Subjective:  Patient is very pleasant patient/ accompanied by her son; admits that has noticed her blood pressure has been more elevated in the past 2 weeks; went to  ER yesterday due to concerns for complications from recent fall and the elevated blood pressure; not felt to have hypertensive urgency; CTs were negative for any type of fracture; Denies any chest pain or shortness of breath; admits that is very anxious and stressed; has had a very difficult year personally; was started on Paxil at her last OV with PCP; admits not taking it; questioning why she can't take Valium daily to help with her anxiety; Denies any chest pain, shortness of breath, blurred vision or headache. She admits that when she lies down and is able to rest at home that her blood pressure is better.     Objective:  Vitals:   12/02/17 1402  BP: (!) 168/82  Pulse: (!) 58  Temp: 97.7  F (36.5 C)  TempSrc: Oral  SpO2: 97%  Weight: 123 lb 1.3 oz (55.8 kg)  Height: 5\' 1"  (1.549 m)    General: Well developed, well nourished, in no acute distress  Skin : Warm and dry.  Head: Normocephalic and atraumatic  Lungs: Respirations unlabored; clear to auscultation bilaterally without wheeze, rales, rhonchi  CVS exam: normal rate and regular rhythm.  Neurologic: Alert and oriented; speech intact; face symmetrical; moves all extremities well; CNII-XII intact without focal deficit   Assessment:  1. Hypertension, unspecified type   2. Anxiety state     Plan:  ? If uncontrolled anxiety is cause of her blood pressure being elevated;  Update EKG today- no acute changes seen; extensive medication review done with patient; have asked her to take daily Paxil and Valium for next 2 weeks; she understands the Paxil needs time to reach therapeutic level in her system; after 2 weeks, she will continue daily Paxil and just use the Valium as needed; do not feel need to add extra blood pressure medication at this time; keep planned follow-up with her PCP for next month;  Spent 30 minutes with patient; greater than 50% spent in counseling;   No follow-ups on file.  Orders Placed This Encounter  Procedures  . EKG 12-Lead    Requested Prescriptions    No prescriptions requested or ordered in this encounter

## 2017-12-02 NOTE — Patient Instructions (Signed)
Please take the Valium everyday x [redacted] weeks along with the Paxil 10 mg daily; after 2 weeks, just take the Paxil daily and use the Valium as needed. Please keep your planned follow up with Dr. Alain Marion.

## 2017-12-02 NOTE — Telephone Encounter (Signed)
Pt went to ED

## 2017-12-30 ENCOUNTER — Other Ambulatory Visit: Payer: Self-pay | Admitting: Family

## 2017-12-31 NOTE — Telephone Encounter (Signed)
Last OV 10/27/17  [place] Controlled Substance Database checked. Last filled on 11/28/17

## 2018-01-05 ENCOUNTER — Encounter: Payer: Self-pay | Admitting: Internal Medicine

## 2018-01-05 ENCOUNTER — Ambulatory Visit: Payer: Medicare Other | Admitting: Internal Medicine

## 2018-01-05 VITALS — BP 160/88 | HR 56 | Temp 98.0°F | Ht 61.0 in | Wt 123.0 lb

## 2018-01-05 DIAGNOSIS — F411 Generalized anxiety disorder: Secondary | ICD-10-CM | POA: Diagnosis not present

## 2018-01-05 DIAGNOSIS — G4700 Insomnia, unspecified: Secondary | ICD-10-CM | POA: Diagnosis not present

## 2018-01-05 DIAGNOSIS — I10 Essential (primary) hypertension: Secondary | ICD-10-CM | POA: Diagnosis not present

## 2018-01-05 DIAGNOSIS — S301XXA Contusion of abdominal wall, initial encounter: Secondary | ICD-10-CM | POA: Insufficient documentation

## 2018-01-05 DIAGNOSIS — S301XXD Contusion of abdominal wall, subsequent encounter: Secondary | ICD-10-CM

## 2018-01-05 MED ORDER — ZOLPIDEM TARTRATE 5 MG PO TABS: 5.0000 mg | ORAL_TABLET | Freq: Every evening | ORAL | 3 refills | Status: DC | PRN

## 2018-01-05 MED ORDER — METOPROLOL SUCCINATE ER 50 MG PO TB24
50.0000 mg | ORAL_TABLET | Freq: Every day | ORAL | 11 refills | Status: DC
Start: 2018-01-05 — End: 2018-01-05

## 2018-01-05 MED ORDER — VALACYCLOVIR HCL 500 MG PO TABS: 500.0000 mg | ORAL_TABLET | Freq: Two times a day (BID) | ORAL | 1 refills | Status: DC

## 2018-01-05 MED ORDER — METOPROLOL SUCCINATE ER 50 MG PO TB24: 50.0000 mg | ORAL_TABLET | Freq: Every day | ORAL | 11 refills | Status: DC

## 2018-01-05 NOTE — Patient Instructions (Signed)
BP Readings from Last 3 Encounters:  01/05/18 (!) 160/88  12/02/17 (!) 168/82  12/01/17 (!) 188/73   Normal BP <130/85

## 2018-01-05 NOTE — Assessment & Plan Note (Signed)
Ice/heat

## 2018-01-05 NOTE — Assessment & Plan Note (Signed)
Zolpidem prn  Potential benefits of a long term benzodiazepines  use as well as potential risks  and complications were explained to the patient and were aknowledged. 

## 2018-01-05 NOTE — Assessment & Plan Note (Signed)
Worse: increase Toprol XL to 50 mg/d

## 2018-01-05 NOTE — Assessment & Plan Note (Signed)
Cont Paxil

## 2018-01-05 NOTE — Progress Notes (Signed)
Subjective:  Patient ID: Whitney Benson, female    DOB: [birth year]  Age: 83 y.o. MRN: [id number]  CC: No chief complaint on file.   HPI Whitney Benson presents for ER f/u: fell backwards on her steps on 11/28/17 - ER on 10/14 with (-) CT head/neck. C/o L hip pain F/u HTN, anxiety  Outpatient Medications Prior to Visit  Medication Sig Dispense Refill  . aspirin 81 MG tablet Take 81 mg by mouth daily.      Marland Kitchen atorvastatin (LIPITOR) 20 MG tablet Take 1 tablet (20 mg total) by mouth daily. 90 tablet 3  . cholecalciferol (VITAMIN D) 1000 units tablet Take 1,000 Units by mouth daily.    . diazepam (VALIUM) 5 MG tablet Take 1 tablet (5 mg total) by mouth every 6 (six) hours as needed for anxiety. Do not take with zolpidem 30 tablet 3  . glucosamine-chondroitin 500-400 MG tablet Take 1 tablet by mouth daily.    . metoprolol succinate (TOPROL-XL) 25 MG 24 hr tablet Take 1 tablet (25 mg total) by mouth daily. Annual appt is due  must see provider for future refills 90 tablet 3  . MULTIPLE VITAMIN PO Take by mouth.      . nitroGLYCERIN (NITROSTAT) 0.4 MG SL tablet Place 1 tablet (0.4 mg total) under the tongue every 5 (five) minutes as needed for chest pain. 20 tablet 1  . PARoxetine (PAXIL) 10 MG tablet Take 1 tablet (10 mg total) by mouth daily. 30 tablet 5  . traMADol-acetaminophen (ULTRACET) 37.5-325 MG tablet Take 1 tablet by mouth every 6 (six) hours as needed for moderate pain. 20 tablet 0  . valsartan-hydrochlorothiazide (DIOVAN-HCT) 320-25 MG tablet TAKE 1 TABLET BY MOUTH DAILY 30 tablet 5  . zolpidem (AMBIEN) 5 MG tablet TAKE 1 TABLET BY MOUTH EVERY NIGHT AT BEDTIME AS NEEDED FOR SLEEP 30 tablet 0   No facility-administered medications prior to visit.     ROS: Review of Systems  Constitutional: Negative for activity change, appetite change, chills, fatigue and unexpected weight change.  HENT: Negative for congestion, mouth sores and sinus pressure.   Eyes: Negative for visual  disturbance.  Respiratory: Negative for cough and chest tightness.   Gastrointestinal: Negative for abdominal pain and nausea.  Genitourinary: Negative for difficulty urinating, frequency and vaginal pain.  Musculoskeletal: Negative for back pain and gait problem.  Skin: Negative for pallor and rash.  Neurological: Negative for dizziness, tremors, weakness, numbness and headaches.  Psychiatric/Behavioral: Negative for confusion, sleep disturbance and suicidal ideas. The patient is nervous/anxious.     Objective:  BP (!) 160/88 (BP Location: Left Arm, Patient Position: Sitting, Cuff Size: Normal)   Pulse (!) 56   Temp 98 F (36.7 C) (Oral)   Ht 5\' 1"  (1.549 m)   Wt 123 lb (55.8 kg)   SpO2 96%   BMI 23.24 kg/m   BP Readings from Last 3 Encounters:  01/05/18 (!) 160/88  12/02/17 (!) 168/82  12/01/17 (!) 188/73    Wt Readings from Last 3 Encounters:  01/05/18 123 lb (55.8 kg)  12/02/17 123 lb 1.3 oz (55.8 kg)  12/01/17 125 lb (56.7 kg)    Physical Exam  Constitutional: She appears well-developed. No distress.  HENT:  Head: Normocephalic.  Right Ear: External ear normal.  Left Ear: External ear normal.  Nose: Nose normal.  Mouth/Throat: Oropharynx is clear and moist.  Eyes: Pupils are equal, round, and reactive to light. Conjunctivae are normal. Right eye exhibits no discharge. Left  eye exhibits no discharge.  Neck: Normal range of motion. Neck supple. No JVD present. No tracheal deviation present. No thyromegaly present.  Cardiovascular: Normal rate, regular rhythm and normal heart sounds.  Pulmonary/Chest: No stridor. No respiratory distress. She has no wheezes.  Abdominal: Soft. Bowel sounds are normal. She exhibits no distension and no mass. There is no tenderness. There is no rebound and no guarding.  Musculoskeletal: She exhibits no edema or tenderness.  Lymphadenopathy:    She has no cervical adenopathy.  Neurological: She displays normal reflexes. No cranial nerve  deficit. She exhibits normal muscle tone. Coordination normal.  Skin: No rash noted. No erythema.  Psychiatric: She has a normal mood and affect. Her behavior is normal. Judgment and thought content normal.  L hip NT tender; L iliac crest - tender to palpation Cold sore upper lip  Lab Results  Component Value Date   WBC 8.5 08/19/2017   HGB 12.4 08/19/2017   HCT 35.6 (L) 08/19/2017   PLT 331.0 08/19/2017   GLUCOSE 101 (H) 08/19/2017   CHOL 127 08/19/2017   TRIG 90.0 08/19/2017   HDL 58.40 08/19/2017   LDLDIRECT 84.1 07/20/2012   LDLCALC 50 08/19/2017   ALT 13 08/19/2017   AST 15 08/19/2017   NA 131 (L) 08/19/2017   K 3.8 08/19/2017   CL 94 (L) 08/19/2017   CREATININE 0.71 08/19/2017   BUN 15 08/19/2017   CO2 30 08/19/2017   TSH 0.68 08/19/2017    Ct Head Wo Contrast  Result Date: 12/01/2017 CLINICAL DATA:  Fall 3 days ago, hit head.  Posterior neck pain. EXAM: CT HEAD WITHOUT CONTRAST CT CERVICAL SPINE WITHOUT CONTRAST TECHNIQUE: Multidetector CT imaging of the head and cervical spine was performed following the standard protocol without intravenous contrast. Multiplanar CT image reconstructions of the cervical spine were also generated. COMPARISON:  None. FINDINGS: CT HEAD FINDINGS Brain: Diffuse age related volume loss. No acute intracranial abnormality. Specifically, no hemorrhage, hydrocephalus, mass lesion, acute infarction, or significant intracranial injury. Vascular: No hyperdense vessel or unexpected calcification. Skull: No acute calvarial abnormality. Sinuses/Orbits: Visualized paranasal sinuses and mastoids clear. Orbital soft tissues unremarkable. Other: None CT CERVICAL SPINE FINDINGS Alignment: Slight retrolisthesis of C5 on C6 related to facet disease. Skull base and vertebrae: No acute fracture. No primary bone lesion or focal pathologic process. Soft tissues and spinal canal: No prevertebral fluid or swelling. No visible canal hematoma. Disc levels: Degenerative disc  disease changes with disc space loss and spurring at C5-6 and C6-7. Mild diffuse degenerative facet disease. Upper chest: No acute findings Other: None IMPRESSION: No acute intracranial abnormality. Mild cervical spondylosis.  No acute bony abnormality. Electronically Signed   By: Rolm Baptise M.D.   On: 12/01/2017 11:12   Ct Cervical Spine Wo Contrast  Result Date: 12/01/2017 CLINICAL DATA:  Fall 3 days ago, hit head.  Posterior neck pain. EXAM: CT HEAD WITHOUT CONTRAST CT CERVICAL SPINE WITHOUT CONTRAST TECHNIQUE: Multidetector CT imaging of the head and cervical spine was performed following the standard protocol without intravenous contrast. Multiplanar CT image reconstructions of the cervical spine were also generated. COMPARISON:  None. FINDINGS: CT HEAD FINDINGS Brain: Diffuse age related volume loss. No acute intracranial abnormality. Specifically, no hemorrhage, hydrocephalus, mass lesion, acute infarction, or significant intracranial injury. Vascular: No hyperdense vessel or unexpected calcification. Skull: No acute calvarial abnormality. Sinuses/Orbits: Visualized paranasal sinuses and mastoids clear. Orbital soft tissues unremarkable. Other: None CT CERVICAL SPINE FINDINGS Alignment: Slight retrolisthesis of C5 on C6 related to  facet disease. Skull base and vertebrae: No acute fracture. No primary bone lesion or focal pathologic process. Soft tissues and spinal canal: No prevertebral fluid or swelling. No visible canal hematoma. Disc levels: Degenerative disc disease changes with disc space loss and spurring at C5-6 and C6-7. Mild diffuse degenerative facet disease. Upper chest: No acute findings Other: None IMPRESSION: No acute intracranial abnormality. Mild cervical spondylosis.  No acute bony abnormality. Electronically Signed   By: Rolm Baptise M.D.   On: 12/01/2017 11:12    Assessment & Plan:   There are no diagnoses linked to this encounter.   No orders of the defined types were placed  in this encounter.    Follow-up: No follow-ups on file.  Walker Kehr, MD

## 2018-01-19 DIAGNOSIS — Z1231 Encounter for screening mammogram for malignant neoplasm of breast: Secondary | ICD-10-CM | POA: Diagnosis not present

## 2018-01-19 DIAGNOSIS — Z6824 Body mass index (BMI) 24.0-24.9, adult: Secondary | ICD-10-CM | POA: Diagnosis not present

## 2018-02-25 ENCOUNTER — Ambulatory Visit (INDEPENDENT_AMBULATORY_CARE_PROVIDER_SITE_OTHER): Payer: Medicare Other | Admitting: Internal Medicine

## 2018-02-25 ENCOUNTER — Ambulatory Visit (INDEPENDENT_AMBULATORY_CARE_PROVIDER_SITE_OTHER)
Admission: RE | Admit: 2018-02-25 | Discharge: 2018-02-25 | Disposition: A | Discharge status: Home / Self Care | Payer: Medicare Other | Source: Ambulatory Visit | Attending: Internal Medicine | Admitting: Internal Medicine

## 2018-02-25 ENCOUNTER — Encounter: Payer: Self-pay | Admitting: Internal Medicine

## 2018-02-25 VITALS — BP 148/84 | HR 67 | Temp 98.0°F | Ht 61.0 in | Wt 124.0 lb

## 2018-02-25 DIAGNOSIS — G4700 Insomnia, unspecified: Secondary | ICD-10-CM | POA: Diagnosis not present

## 2018-02-25 DIAGNOSIS — F411 Generalized anxiety disorder: Secondary | ICD-10-CM | POA: Diagnosis not present

## 2018-02-25 DIAGNOSIS — I739 Peripheral vascular disease, unspecified: Secondary | ICD-10-CM | POA: Diagnosis not present

## 2018-02-25 DIAGNOSIS — E785 Hyperlipidemia, unspecified: Secondary | ICD-10-CM

## 2018-02-25 DIAGNOSIS — H538 Other visual disturbances: Secondary | ICD-10-CM | POA: Diagnosis not present

## 2018-02-25 DIAGNOSIS — S3992XA Unspecified injury of lower back, initial encounter: Secondary | ICD-10-CM | POA: Diagnosis not present

## 2018-02-25 DIAGNOSIS — M545 Low back pain: Secondary | ICD-10-CM | POA: Diagnosis not present

## 2018-02-25 MED ORDER — ZOLPIDEM TARTRATE 5 MG PO TABS: 5.0000 mg | ORAL_TABLET | Freq: Every evening | ORAL | 3 refills | Status: DC | PRN

## 2018-02-25 NOTE — Progress Notes (Signed)
Subjective:  Patient ID: Whitney Benson, female    DOB: 07/16/1934  Age: 84 y.o. MRN: [id number]  CC: No chief complaint on file.   HPI Whitney Benson presents for HTN - BP nl at home, depression/grief, dyslipidemia C/o legs hurt w/walking - claudication x wonths C/o blurred vision   Outpatient Medications Prior to Visit  Medication Sig Dispense Refill  . aspirin 81 MG tablet Take 81 mg by mouth daily.      Marland Kitchen atorvastatin (LIPITOR) 20 MG tablet Take 1 tablet (20 mg total) by mouth daily. 90 tablet 3  . cholecalciferol (VITAMIN D) 1000 units tablet Take 1,000 Units by mouth daily.    . diazepam (VALIUM) 5 MG tablet Take 1 tablet (5 mg total) by mouth every 6 (six) hours as needed for anxiety. Do not take with zolpidem 30 tablet 3  . glucosamine-chondroitin 500-400 MG tablet Take 1 tablet by mouth daily.    . metoprolol succinate (TOPROL XL) 50 MG 24 hr tablet Take 1 tablet (50 mg total) by mouth daily. Take with or immediately following a meal. 30 tablet 11  . MULTIPLE VITAMIN Benson Take by mouth.      . nitroGLYCERIN (NITROSTAT) 0.4 MG SL tablet Place 1 tablet (0.4 mg total) under the tongue every 5 (five) minutes as needed for chest pain. 20 tablet 1  . PARoxetine (PAXIL) 10 MG tablet Take 1 tablet (10 mg total) by mouth daily. 30 tablet 5  . traMADol-acetaminophen (ULTRACET) 37.5-325 MG tablet Take 1 tablet by mouth every 6 (six) hours as needed for moderate pain. 20 tablet 0  . valACYclovir (VALTREX) 500 MG tablet Take 1 tablet (500 mg total) by mouth 2 (two) times daily. 10 tablet 1  . valsartan-hydrochlorothiazide (DIOVAN-HCT) 320-25 MG tablet TAKE 1 TABLET BY MOUTH DAILY 30 tablet 5  . zolpidem (AMBIEN) 5 MG tablet Take 1 tablet (5 mg total) by mouth at bedtime as needed. for sleep 30 tablet 3   No facility-administered medications prior to visit.     ROS: Review of Systems  Constitutional: Negative.  Negative for activity change, appetite change, chills, diaphoresis, fatigue,  fever and unexpected weight change.  HENT: Negative for congestion, ear pain, facial swelling, hearing loss, mouth sores, nosebleeds, postnasal drip, rhinorrhea, sinus pressure, sneezing, sore throat, tinnitus and trouble swallowing.   Eyes: Negative for pain, discharge, redness, itching and visual disturbance.  Respiratory: Negative for cough, chest tightness, shortness of breath, wheezing and stridor.   Cardiovascular: Negative for chest pain, palpitations and leg swelling.  Gastrointestinal: Negative for abdominal distention, anal bleeding, blood in stool, constipation, diarrhea, nausea and rectal pain.  Genitourinary: Negative for difficulty urinating, dysuria, flank pain, frequency, genital sores, hematuria, pelvic pain, urgency, vaginal bleeding and vaginal discharge.  Musculoskeletal: Positive for arthralgias and gait problem. Negative for back pain, joint swelling, neck pain and neck stiffness.  Skin: Negative.  Negative for rash.  Neurological: Negative for dizziness, tremors, seizures, syncope, speech difficulty, weakness, numbness and headaches.  Hematological: Negative for adenopathy. Does not bruise/bleed easily.  Psychiatric/Behavioral: Positive for sleep disturbance. Negative for behavioral problems, decreased concentration, dysphoric mood and suicidal ideas. The patient is nervous/anxious.     Objective:  BP (!) 148/84 (BP Location: Right Arm, Patient Position: Sitting, Cuff Size: Normal)   Pulse 67   Temp 98 F (36.7 C) (Oral)   Ht 5\' 1"  (1.549 m)   Wt 124 lb (56.2 kg)   SpO2 98%   BMI 23.43 kg/m   BP  Readings from Last 3 Encounters:  02/25/18 (!) 148/84  01/05/18 (!) 160/88  12/02/17 (!) 168/82    Wt Readings from Last 3 Encounters:  02/25/18 124 lb (56.2 kg)  01/05/18 123 lb (55.8 kg)  12/02/17 123 lb 1.3 oz (55.8 kg)    Physical Exam Constitutional:      General: She is not in acute distress.    Appearance: She is well-developed.  HENT:     Head:  Normocephalic.     Right Ear: External ear normal.     Left Ear: External ear normal.     Nose: Nose normal.  Eyes:     General:        Right eye: No discharge.        Left eye: No discharge.     Conjunctiva/sclera: Conjunctivae normal.     Pupils: Pupils are equal, round, and reactive to light.  Neck:     Musculoskeletal: Normal range of motion and neck supple.     Thyroid: No thyromegaly.     Vascular: No JVD.     Trachea: No tracheal deviation.  Cardiovascular:     Rate and Rhythm: Normal rate and regular rhythm.     Heart sounds: Normal heart sounds.  Pulmonary:     Effort: No respiratory distress.     Breath sounds: No stridor. No wheezing.  Abdominal:     General: Bowel sounds are normal. There is no distension.     Palpations: Abdomen is soft. There is no mass.     Tenderness: There is no abdominal tenderness. There is no guarding or rebound.  Musculoskeletal:        General: No tenderness.  Lymphadenopathy:     Cervical: No cervical adenopathy.  Skin:    Findings: No erythema or rash.  Neurological:     Cranial Nerves: No cranial nerve deficit.     Motor: No abnormal muscle tone.     Coordination: Coordination normal.     Deep Tendon Reflexes: Reflexes normal.  Psychiatric:        Behavior: Behavior normal.        Thought Content: Thought content normal.        Judgment: Judgment normal.   LS tender w/ROM Pulses OK  Lab Results  Component Value Date   WBC 8.5 08/19/2017   HGB 12.4 08/19/2017   HCT 35.6 (L) 08/19/2017   PLT 331.0 08/19/2017   GLUCOSE 101 (H) 08/19/2017   CHOL 127 08/19/2017   TRIG 90.0 08/19/2017   HDL 58.40 08/19/2017   LDLDIRECT 84.1 07/20/2012   LDLCALC 50 08/19/2017   ALT 13 08/19/2017   AST 15 08/19/2017   NA 131 (L) 08/19/2017   K 3.8 08/19/2017   CL 94 (L) 08/19/2017   CREATININE 0.71 08/19/2017   BUN 15 08/19/2017   CO2 30 08/19/2017   TSH 0.68 08/19/2017    Ct Head Wo Contrast  Result Date: 12/01/2017 CLINICAL  DATA:  Fall 3 days ago, hit head.  Posterior neck pain. EXAM: CT HEAD WITHOUT CONTRAST CT CERVICAL SPINE WITHOUT CONTRAST TECHNIQUE: Multidetector CT imaging of the head and cervical spine was performed following the standard protocol without intravenous contrast. Multiplanar CT image reconstructions of the cervical spine were also generated. COMPARISON:  None. FINDINGS: CT HEAD FINDINGS Brain: Diffuse age related volume loss. No acute intracranial abnormality. Specifically, no hemorrhage, hydrocephalus, mass lesion, acute infarction, or significant intracranial injury. Vascular: No hyperdense vessel or unexpected calcification. Skull: No acute calvarial abnormality. Sinuses/Orbits: Visualized  paranasal sinuses and mastoids clear. Orbital soft tissues unremarkable. Other: None CT CERVICAL SPINE FINDINGS Alignment: Slight retrolisthesis of C5 on C6 related to facet disease. Skull base and vertebrae: No acute fracture. No primary bone lesion or focal pathologic process. Soft tissues and spinal canal: No prevertebral fluid or swelling. No visible canal hematoma. Disc levels: Degenerative disc disease changes with disc space loss and spurring at C5-6 and C6-7. Mild diffuse degenerative facet disease. Upper chest: No acute findings Other: None IMPRESSION: No acute intracranial abnormality. Mild cervical spondylosis.  No acute bony abnormality. Electronically Signed   By: Rolm Baptise M.D.   On: 12/01/2017 11:12   Ct Cervical Spine Wo Contrast  Result Date: 12/01/2017 CLINICAL DATA:  Fall 3 days ago, hit head.  Posterior neck pain. EXAM: CT HEAD WITHOUT CONTRAST CT CERVICAL SPINE WITHOUT CONTRAST TECHNIQUE: Multidetector CT imaging of the head and cervical spine was performed following the standard protocol without intravenous contrast. Multiplanar CT image reconstructions of the cervical spine were also generated. COMPARISON:  None. FINDINGS: CT HEAD FINDINGS Brain: Diffuse age related volume loss. No acute  intracranial abnormality. Specifically, no hemorrhage, hydrocephalus, mass lesion, acute infarction, or significant intracranial injury. Vascular: No hyperdense vessel or unexpected calcification. Skull: No acute calvarial abnormality. Sinuses/Orbits: Visualized paranasal sinuses and mastoids clear. Orbital soft tissues unremarkable. Other: None CT CERVICAL SPINE FINDINGS Alignment: Slight retrolisthesis of C5 on C6 related to facet disease. Skull base and vertebrae: No acute fracture. No primary bone lesion or focal pathologic process. Soft tissues and spinal canal: No prevertebral fluid or swelling. No visible canal hematoma. Disc levels: Degenerative disc disease changes with disc space loss and spurring at C5-6 and C6-7. Mild diffuse degenerative facet disease. Upper chest: No acute findings Other: None IMPRESSION: No acute intracranial abnormality. Mild cervical spondylosis.  No acute bony abnormality. Electronically Signed   By: Rolm Baptise M.D.   On: 12/01/2017 11:12    Assessment & Plan:   There are no diagnoses linked to this encounter.   No orders of the defined types were placed in this encounter.    Follow-up: No follow-ups on file.  Walker Kehr, MD

## 2018-02-25 NOTE — Assessment & Plan Note (Addendum)
Discussed Vascular study LS spine X ray

## 2018-02-25 NOTE — Assessment & Plan Note (Addendum)
CT ca scoring test offered 1/20

## 2018-02-25 NOTE — Assessment & Plan Note (Addendum)
Zolpidem prn 

## 2018-02-25 NOTE — Assessment & Plan Note (Addendum)
On Paxil Weighted blanket option

## 2018-02-25 NOTE — Patient Instructions (Addendum)
Weighted blanket option Valerian root   Cardiac CT calcium scoring test $150   Computed tomography, more commonly known as a CT or CAT scan, is a diagnostic medical imaging test. Like traditional x-rays, it produces multiple images or pictures of the inside of the body. The cross-sectional images generated during a CT scan can be reformatted in multiple planes. They can even generate three-dimensional images. These images can be viewed on a computer monitor, printed on film or by a 3D printer, or transferred to a CD or DVD. CT images of internal organs, bones, soft tissue and blood vessels provide greater detail than traditional x-rays, particularly of soft tissues and blood vessels. A cardiac CT scan for coronary calcium is a non-invasive way of obtaining information about the presence, location and extent of calcified plaque in the coronary arteries-the vessels that supply oxygen-containing blood to the heart muscle. Calcified plaque results when there is a build-up of fat and other substances under the inner layer of the artery. This material can calcify which signals the presence of atherosclerosis, a disease of the vessel wall, also called coronary artery disease (CAD). People with this disease have an increased risk for heart attacks. In addition, over time, progression of plaque build up (CAD) can narrow the arteries or even close off blood flow to the heart. The result may be chest pain, sometimes called "angina," or a heart attack. Because calcium is a marker of CAD, the amount of calcium detected on a cardiac CT scan is a helpful prognostic tool. The findings on cardiac CT are expressed as a calcium score. Another name for this test is coronary artery calcium scoring.  What are some common uses of the procedure? The goal of cardiac CT scan for calcium scoring is to determine if CAD is present and to what extent, even if there are no symptoms. It is a screening study that may be recommended by  a physician for patients with risk factors for CAD but no clinical symptoms. The major risk factors for CAD are: . high blood cholesterol levels  . family history of heart attacks  . diabetes  . high blood pressure  . cigarette smoking  . overweight or obese  . physical inactivity   A negative cardiac CT scan for calcium scoring shows no calcification within the coronary arteries. This suggests that CAD is absent or so minimal it cannot be seen by this technique. The chance of having a heart attack over the next two to five years is very low under these circumstances. A positive test means that CAD is present, regardless of whether or not the patient is experiencing any symptoms. The amount of calcification-expressed as the calcium score-may help to predict the likelihood of a myocardial infarction (heart attack) in the coming years and helps your medical doctor or cardiologist decide whether the patient may need to take preventive medicine or undertake other measures such as diet and exercise to lower the risk for heart attack. The extent of CAD is graded according to your calcium score:  Calcium Score  Presence of CAD  0 No evidence of CAD   1-10 Minimal evidence of CAD  11-100 Mild evidence of CAD  101-400 Moderate evidence of CAD  Over 400 Extensive evidence of CAD

## 2018-02-27 NOTE — Addendum Note (Signed)
Addended by: Karren Cobble on: 02/27/2018 11:12 AM   Modules accepted: Orders

## 2018-03-05 ENCOUNTER — Ambulatory Visit (HOSPITAL_COMMUNITY)
Admission: RE | Admit: 2018-03-05 | Payer: Medicare Other | Source: Ambulatory Visit | Attending: Internal Medicine | Admitting: Internal Medicine

## 2018-03-09 ENCOUNTER — Ambulatory Visit (HOSPITAL_COMMUNITY)
Admission: RE | Admit: 2018-03-09 | Discharge: 2018-03-09 | Disposition: A | Discharge status: Home / Self Care | Payer: Medicare Other | Source: Ambulatory Visit | Attending: Internal Medicine | Admitting: Internal Medicine

## 2018-03-09 DIAGNOSIS — I739 Peripheral vascular disease, unspecified: Secondary | ICD-10-CM | POA: Diagnosis not present

## 2018-03-17 DIAGNOSIS — H25013 Cortical age-related cataract, bilateral: Secondary | ICD-10-CM | POA: Diagnosis not present

## 2018-03-17 DIAGNOSIS — H5203 Hypermetropia, bilateral: Secondary | ICD-10-CM | POA: Diagnosis not present

## 2018-03-17 DIAGNOSIS — H524 Presbyopia: Secondary | ICD-10-CM | POA: Diagnosis not present

## 2018-03-17 DIAGNOSIS — H2513 Age-related nuclear cataract, bilateral: Secondary | ICD-10-CM | POA: Diagnosis not present

## 2018-03-17 DIAGNOSIS — H52203 Unspecified astigmatism, bilateral: Secondary | ICD-10-CM | POA: Diagnosis not present

## 2018-04-01 DIAGNOSIS — H25011 Cortical age-related cataract, right eye: Secondary | ICD-10-CM | POA: Diagnosis not present

## 2018-04-01 DIAGNOSIS — H25012 Cortical age-related cataract, left eye: Secondary | ICD-10-CM | POA: Diagnosis not present

## 2018-04-01 DIAGNOSIS — H2511 Age-related nuclear cataract, right eye: Secondary | ICD-10-CM | POA: Diagnosis not present

## 2018-04-01 DIAGNOSIS — H2512 Age-related nuclear cataract, left eye: Secondary | ICD-10-CM | POA: Diagnosis not present

## 2018-04-08 DIAGNOSIS — H2511 Age-related nuclear cataract, right eye: Secondary | ICD-10-CM | POA: Diagnosis not present

## 2018-04-08 DIAGNOSIS — H25011 Cortical age-related cataract, right eye: Secondary | ICD-10-CM | POA: Diagnosis not present

## 2018-04-09 ENCOUNTER — Other Ambulatory Visit: Payer: Self-pay | Admitting: Internal Medicine

## 2018-04-11 ENCOUNTER — Other Ambulatory Visit: Payer: Self-pay | Admitting: Internal Medicine

## 2018-05-01 ENCOUNTER — Telehealth: Payer: Self-pay

## 2018-05-01 NOTE — Telephone Encounter (Signed)
Patient called and stated she is having  "small pellets"  When having bowel movements. Stated she does not feel like she is constipated and is also not having diarrhea. She would like a medication sent to the pharmacy.   Please advise

## 2018-05-02 NOTE — Telephone Encounter (Signed)
Use over-the-counter MiraLAX.  Dissolve 1 scoop in a drink and take daily.  Let me know if not better. Thanks

## 2018-05-08 ENCOUNTER — Other Ambulatory Visit: Payer: Self-pay | Admitting: Internal Medicine

## 2018-05-08 NOTE — Telephone Encounter (Signed)
Pt.notified

## 2018-05-19 ENCOUNTER — Ambulatory Visit: Payer: Self-pay | Admitting: *Deleted

## 2018-05-19 NOTE — Telephone Encounter (Signed)
FYI

## 2018-05-19 NOTE — Telephone Encounter (Signed)
Contacted pt regarding her symptoms; she has been constipated for over a week, and tried a mild laxative (pt said a stool softener); she said that she used some medication that her daughter used for a colonoscopy instead and got relief; the pt would like to know if she could take this; pt given recommendations per Dr Alain Marion, " Use over -the-counter MiraLAX.  Dissolve 1 scoop in a drink and take daily.  Let me know if not better."; the pt says that she had not received this information, and  will try this; recommendations also given per nurse triage protocol; she verbalizes understanding; will route to office for notification.  Reason for Disposition . Mild constipation  Protocols used: CONSTIPATION-A-AH

## 2018-05-27 ENCOUNTER — Ambulatory Visit: Payer: Medicare Other | Admitting: Internal Medicine

## 2018-06-27 ENCOUNTER — Other Ambulatory Visit: Payer: Self-pay | Admitting: Internal Medicine

## 2018-07-20 DIAGNOSIS — Z961 Presence of intraocular lens: Secondary | ICD-10-CM | POA: Diagnosis not present

## 2018-07-28 ENCOUNTER — Encounter: Payer: Self-pay | Admitting: Internal Medicine

## 2018-07-28 ENCOUNTER — Ambulatory Visit (INDEPENDENT_AMBULATORY_CARE_PROVIDER_SITE_OTHER): Payer: Medicare Other | Admitting: Internal Medicine

## 2018-07-28 DIAGNOSIS — K5900 Constipation, unspecified: Secondary | ICD-10-CM | POA: Insufficient documentation

## 2018-07-28 DIAGNOSIS — K5901 Slow transit constipation: Secondary | ICD-10-CM

## 2018-07-28 DIAGNOSIS — F411 Generalized anxiety disorder: Secondary | ICD-10-CM | POA: Diagnosis not present

## 2018-07-28 DIAGNOSIS — I10 Essential (primary) hypertension: Secondary | ICD-10-CM

## 2018-07-28 DIAGNOSIS — F418 Other specified anxiety disorders: Secondary | ICD-10-CM

## 2018-07-28 MED ORDER — TRIAMCINOLONE ACETONIDE 0.1 % EX OINT: 1.0000 "application " | TOPICAL_OINTMENT | Freq: Two times a day (BID) | CUTANEOUS | 0 refills | Status: DC

## 2018-07-28 NOTE — Assessment & Plan Note (Signed)
Paxil 

## 2018-07-28 NOTE — Assessment & Plan Note (Signed)
Valsartan HCT 

## 2018-07-28 NOTE — Progress Notes (Signed)
Virtual Visit via Video Note  I connected with Whitney Benson on 07/28/18 at  3:20 PM EDT by a video enabled telemedicine application and verified that I am speaking with the correct person using two identifiers.   I discussed the limitations of evaluation and management by telemedicine and the availability of in person appointments. The patient expressed understanding and agreed to proceed.  History of Present Illness: We need to follow-up on anxiety, insomnia, wt loss. Pt gained her lost wt back  There has been no runny nose, cough, chest pain, shortness of breath, abdominal pain, diarrhea, arthralgias, skin rashes.constipated   Observations/Objective: The patient appears to be in no acute distress, looks well.  Assessment and Plan:  See my Assessment and Plan. Follow Up Instructions:    I discussed the assessment and treatment plan with the patient. The patient was provided an opportunity to ask questions and all were answered. The patient agreed with the plan and demonstrated an understanding of the instructions.   The patient was advised to call back or seek an in-person evaluation if the symptoms worsen or if the condition fails to improve as anticipated.  I provided face-to-face time during this encounter. We were at different locations.   Walker Kehr, MD

## 2018-07-28 NOTE — Assessment & Plan Note (Signed)
- 

## 2018-07-29 ENCOUNTER — Telehealth: Payer: Self-pay | Admitting: Emergency Medicine

## 2018-07-29 DIAGNOSIS — D485 Neoplasm of uncertain behavior of skin: Secondary | ICD-10-CM

## 2018-07-29 DIAGNOSIS — L900 Lichen sclerosus et atrophicus: Secondary | ICD-10-CM | POA: Diagnosis not present

## 2018-07-29 NOTE — Telephone Encounter (Signed)
Done. Thx.

## 2018-07-29 NOTE — Telephone Encounter (Signed)
Pt was seen virtually yesterday, would like derm referral placed to [facility].

## 2018-08-03 ENCOUNTER — Other Ambulatory Visit: Payer: Self-pay | Admitting: Internal Medicine

## 2018-09-08 DIAGNOSIS — L900 Lichen sclerosus et atrophicus: Secondary | ICD-10-CM | POA: Diagnosis not present

## 2018-09-29 ENCOUNTER — Other Ambulatory Visit: Payer: Self-pay | Admitting: Internal Medicine

## 2018-10-15 ENCOUNTER — Other Ambulatory Visit: Payer: Self-pay | Admitting: Internal Medicine

## 2018-10-20 DIAGNOSIS — Z961 Presence of intraocular lens: Secondary | ICD-10-CM | POA: Diagnosis not present

## 2018-10-29 ENCOUNTER — Other Ambulatory Visit (INDEPENDENT_AMBULATORY_CARE_PROVIDER_SITE_OTHER): Payer: Medicare Other

## 2018-10-29 ENCOUNTER — Ambulatory Visit (INDEPENDENT_AMBULATORY_CARE_PROVIDER_SITE_OTHER): Payer: Medicare Other | Admitting: Internal Medicine

## 2018-10-29 ENCOUNTER — Other Ambulatory Visit: Payer: Self-pay

## 2018-10-29 ENCOUNTER — Encounter: Payer: Self-pay | Admitting: Internal Medicine

## 2018-10-29 VITALS — BP 142/84 | HR 61 | Temp 98.1°F | Ht 61.0 in | Wt 129.0 lb

## 2018-10-29 DIAGNOSIS — Z0000 Encounter for general adult medical examination without abnormal findings: Secondary | ICD-10-CM

## 2018-10-29 DIAGNOSIS — Z0001 Encounter for general adult medical examination with abnormal findings: Secondary | ICD-10-CM | POA: Diagnosis not present

## 2018-10-29 DIAGNOSIS — G4700 Insomnia, unspecified: Secondary | ICD-10-CM | POA: Diagnosis not present

## 2018-10-29 DIAGNOSIS — Z23 Encounter for immunization: Secondary | ICD-10-CM

## 2018-10-29 DIAGNOSIS — F418 Other specified anxiety disorders: Secondary | ICD-10-CM

## 2018-10-29 DIAGNOSIS — E785 Hyperlipidemia, unspecified: Secondary | ICD-10-CM | POA: Diagnosis not present

## 2018-10-29 DIAGNOSIS — F4321 Adjustment disorder with depressed mood: Secondary | ICD-10-CM

## 2018-10-29 DIAGNOSIS — L280 Lichen simplex chronicus: Secondary | ICD-10-CM | POA: Diagnosis not present

## 2018-10-29 LAB — CBC WITH DIFFERENTIAL/PLATELET
Basophils Absolute: 0 10*3/uL (ref 0.0–0.1)
Basophils Relative: 0.6 % (ref 0.0–3.0)
Eosinophils Absolute: 0.2 10*3/uL (ref 0.0–0.7)
Eosinophils Relative: 2.9 % (ref 0.0–5.0)
HCT: 38.6 % (ref 36.0–46.0)
Hemoglobin: 13 g/dL (ref 12.0–15.0)
Lymphocytes Relative: 33.1 % (ref 12.0–46.0)
Lymphs Abs: 2.4 10*3/uL (ref 0.7–4.0)
MCHC: 33.6 g/dL (ref 30.0–36.0)
MCV: 93.7 fl (ref 78.0–100.0)
Monocytes Absolute: 0.7 10*3/uL (ref 0.1–1.0)
Monocytes Relative: 10.2 % (ref 3.0–12.0)
Neutro Abs: 3.9 10*3/uL (ref 1.4–7.7)
Neutrophils Relative %: 53.2 % (ref 43.0–77.0)
Platelets: 244 10*3/uL (ref 150.0–400.0)
RBC: 4.12 Mil/uL (ref 3.87–5.11)
RDW: 12.5 % (ref 11.5–15.5)
WBC: 7.3 10*3/uL (ref 4.0–10.5)

## 2018-10-29 LAB — TSH: TSH: 1.6 u[IU]/mL (ref 0.35–4.50)

## 2018-10-29 MED ORDER — ZOLPIDEM TARTRATE 5 MG PO TABS: ORAL_TABLET | ORAL | 1 refills | Status: DC

## 2018-10-29 MED ORDER — ATORVASTATIN CALCIUM 20 MG PO TABS: 20.0000 mg | ORAL_TABLET | Freq: Every day | ORAL | 3 refills | Status: DC

## 2018-10-29 MED ORDER — PANTOPRAZOLE SODIUM 40 MG PO TBEC: 40.0000 mg | DELAYED_RELEASE_TABLET | Freq: Every day | ORAL | 3 refills | Status: DC

## 2018-10-29 MED ORDER — VALSARTAN-HYDROCHLOROTHIAZIDE 320-25 MG PO TABS: 1.0000 | ORAL_TABLET | Freq: Every day | ORAL | 3 refills | Status: DC

## 2018-10-29 MED ORDER — PAROXETINE HCL 10 MG PO TABS: 10.0000 mg | ORAL_TABLET | Freq: Every day | ORAL | 1 refills | Status: DC

## 2018-10-29 MED ORDER — METOPROLOL SUCCINATE ER 50 MG PO TB24: 50.0000 mg | ORAL_TABLET | Freq: Every day | ORAL | 3 refills | Status: DC

## 2018-10-29 NOTE — Progress Notes (Signed)
Subjective:  Patient ID: Whitney Benson, female    DOB: Jan 11, 1935  Age: 84 y.o. MRN: [id number]  CC: No chief complaint on file.   HPI Whitney Benson presents for vag itching (lichen Dr Radene Knee) F/u dyslipidemia, HTN, OA Well exam  Outpatient Medications Prior to Visit  Medication Sig Dispense Refill   aspirin 81 MG tablet Take 81 mg by mouth daily.       atorvastatin (LIPITOR) 20 MG tablet TAKE 1 TABLET BY MOUTH DAILY 90 tablet 0   cholecalciferol (VITAMIN D) 1000 units tablet Take 1,000 Units by mouth daily.     diazepam (VALIUM) 5 MG tablet TAKE 1 TABLET BY MOUTH EVERY 6 HOURS AS NEEDED FOR ANXIETY (DO NOT TAKE WITH ZOLPIDEM) 30 tablet 1   glucosamine-chondroitin 500-400 MG tablet Take 1 tablet by mouth daily.     metoprolol succinate (TOPROL XL) 50 MG 24 hr tablet Take 1 tablet (50 mg total) by mouth daily. Take with or immediately following a meal. 30 tablet 11   MULTIPLE VITAMIN PO Take by mouth.       nitroGLYCERIN (NITROSTAT) 0.4 MG SL tablet Place 1 tablet (0.4 mg total) under the tongue every 5 (five) minutes as needed for chest pain. 20 tablet 1   PARoxetine (PAXIL) 10 MG tablet TAKE 1 TABLET BY MOUTH DAILY 30 tablet 11   traMADol-acetaminophen (ULTRACET) 37.5-325 MG tablet Take 1 tablet by mouth every 6 (six) hours as needed for moderate pain. 20 tablet 0   triamcinolone ointment (KENALOG) 0.1 % Apply 1 application topically 2 (two) times daily. 80 g 0   valACYclovir (VALTREX) 500 MG tablet Take 1 tablet (500 mg total) by mouth 2 (two) times daily. 10 tablet 1   valsartan-hydrochlorothiazide (DIOVAN-HCT) 320-25 MG tablet TAKE 1 TABLET BY MOUTH DAILY 30 tablet 5   zolpidem (AMBIEN) 5 MG tablet TAKE 1 TABLET BY MOUTH EACH NIGHT AT BEDTIME AS NEEDED FOR SLEEP 30 tablet 3   No facility-administered medications prior to visit.     ROS: Review of Systems  Constitutional: Negative for activity change, appetite change, chills, fatigue and unexpected weight change.    HENT: Negative for congestion, mouth sores and sinus pressure.   Eyes: Negative for visual disturbance.  Respiratory: Negative for cough and chest tightness.   Gastrointestinal: Negative for abdominal pain and nausea.  Genitourinary: Negative for difficulty urinating, frequency and vaginal pain.  Musculoskeletal: Positive for arthralgias. Negative for back pain and gait problem.  Skin: Negative for pallor and rash.  Neurological: Negative for dizziness, tremors, weakness, numbness and headaches.  Psychiatric/Behavioral: Positive for sleep disturbance. Negative for confusion and suicidal ideas.    Objective:  BP (!) 142/84 (BP Location: Right Arm, Patient Position: Sitting, Cuff Size: Normal)    Pulse 61    Temp 98.1 F (36.7 C) (Oral)    Ht 5\' 1"  (1.549 m)    Wt 129 lb (58.5 kg)    SpO2 96%    BMI 24.37 kg/m   BP Readings from Last 3 Encounters:  10/29/18 (!) 142/84  02/25/18 (!) 148/84  01/05/18 (!) 160/88    Wt Readings from Last 3 Encounters:  10/29/18 129 lb (58.5 kg)  02/25/18 124 lb (56.2 kg)  01/05/18 123 lb (55.8 kg)    Physical Exam Constitutional:      General: She is not in acute distress.    Appearance: She is well-developed.  HENT:     Head: Normocephalic.     Right Ear: External ear  normal.     Left Ear: External ear normal.     Nose: Nose normal.  Eyes:     General:        Right eye: No discharge.        Left eye: No discharge.     Conjunctiva/sclera: Conjunctivae normal.     Pupils: Pupils are equal, round, and reactive to light.  Neck:     Musculoskeletal: Normal range of motion and neck supple.     Thyroid: No thyromegaly.     Vascular: No JVD.     Trachea: No tracheal deviation.  Cardiovascular:     Rate and Rhythm: Normal rate and regular rhythm.     Heart sounds: Normal heart sounds.  Pulmonary:     Effort: No respiratory distress.     Breath sounds: No stridor. No wheezing.  Abdominal:     General: Bowel sounds are normal. There is no  distension.     Palpations: Abdomen is soft. There is no mass.     Tenderness: There is no abdominal tenderness. There is no guarding or rebound.  Musculoskeletal:        General: No tenderness.  Lymphadenopathy:     Cervical: No cervical adenopathy.  Skin:    Findings: No erythema or rash.  Neurological:     Cranial Nerves: No cranial nerve deficit.     Motor: No abnormal muscle tone.     Coordination: Coordination normal.     Deep Tendon Reflexes: Reflexes normal.  Psychiatric:        Behavior: Behavior normal.        Thought Content: Thought content normal.        Judgment: Judgment normal.     Lab Results  Component Value Date   WBC 8.5 08/19/2017   HGB 12.4 08/19/2017   HCT 35.6 (L) 08/19/2017   PLT 331.0 08/19/2017   GLUCOSE 101 (H) 08/19/2017   CHOL 127 08/19/2017   TRIG 90.0 08/19/2017   HDL 58.40 08/19/2017   LDLDIRECT 84.1 07/20/2012   LDLCALC 50 08/19/2017   ALT 13 08/19/2017   AST 15 08/19/2017   NA 131 (L) 08/19/2017   K 3.8 08/19/2017   CL 94 (L) 08/19/2017   CREATININE 0.71 08/19/2017   BUN 15 08/19/2017   CO2 30 08/19/2017   TSH 0.68 08/19/2017    Vas [place] Le Art Seg Multi (segm&le Reynauds)  Result Date: 03/10/2018 LOWER EXTREMITY DOPPLER STUDY Indications: Patient presents today with complaints of left lower leg              claudication symptoms x 6 months. She states after walking to the              her mailbox and back to the house she would have to stop and rest              due to left calf pain. She denies any rest pain. High Risk Factors: Hypertension, hyperlipidemia, past history of smoking.   Performing Technologist: Sharlett Iles RVT  Examination Guidelines: A complete evaluation includes at minimum, Doppler waveform signals and systolic blood pressure reading at the level of bilateral brachial, anterior tibial, and posterior tibial arteries, when vessel segments are accessible. Bilateral testing is considered an integral part of a complete  examination. Photoelectric Plethysmograph (PPG) waveforms and toe systolic pressure readings are included as required and additional duplex testing as needed. Limited examinations for reoccurring indications may be performed as noted.  ABI Findings: +---------+------------------+-----+---------+--------+  Right     Rt Pressure (mmHg) Index Waveform  Comment   +---------+------------------+-----+---------+--------+  Brachial  190                                          +---------+------------------+-----+---------+--------+  CFA                                triphasic           +---------+------------------+-----+---------+--------+  Popliteal                          triphasic           +---------+------------------+-----+---------+--------+  ATA       202                1.02  triphasic           +---------+------------------+-----+---------+--------+  PTA       210                1.06  triphasic           +---------+------------------+-----+---------+--------+  PERO      203                1.03  triphasic           +---------+------------------+-----+---------+--------+  Great Toe 152                0.77  Normal              +---------+------------------+-----+---------+--------+ +---------+------------------+-----+---------+-------+  Left      Lt Pressure (mmHg) Index Waveform  Comment  +---------+------------------+-----+---------+-------+  Brachial  198                                         +---------+------------------+-----+---------+-------+  CFA                                triphasic          +---------+------------------+-----+---------+-------+  Popliteal                          triphasic          +---------+------------------+-----+---------+-------+  ATA       187                0.94  triphasic          +---------+------------------+-----+---------+-------+  PTA       207                1.05  triphasic          +---------+------------------+-----+---------+-------+  PERO      179                 0.90  triphasic          +---------+------------------+-----+---------+-------+  Great Toe 129                0.65  Abnormal           +---------+------------------+-----+---------+-------+ +-------+-----------+-----------+------------+------------+  ABI/TBI Today's ABI Today's TBI Previous ABI Previous TBI  +-------+-----------+-----------+------------+------------+  Right   1.06        .  77                                    +-------+-----------+-----------+------------+------------+  Left    1.05        .65                                    +-------+-----------+-----------+------------+------------+  Summary: Right: Resting right ankle-brachial index is within normal range. No evidence of significant right lower extremity arterial disease. The right toe-brachial index is normal. Left: Resting left ankle-brachial index is within normal range. No evidence of significant left lower extremity arterial disease. The left toe-brachial index is abnormal. *See table(s) above for measurements and observations.  Suggest follow up LE Arterial Duplex if clinically indicated. Electronically signed by Ida Rogue MD on 03/10/2018 at 9:29:05 AM.    Final     Assessment & Plan:   There are no diagnoses linked to this encounter.   No orders of the defined types were placed in this encounter.    Follow-up: No follow-ups on file.  Walker Kehr, MD

## 2018-10-29 NOTE — Assessment & Plan Note (Addendum)
Atorvastatin 20 mg

## 2018-10-29 NOTE — Assessment & Plan Note (Signed)
On Paxil 

## 2018-10-29 NOTE — Assessment & Plan Note (Signed)
Zolpidem prn 

## 2018-10-29 NOTE — Assessment & Plan Note (Addendum)
We discussed age appropriate health related issues, including available/recomended screening tests and vaccinations. We discussed a need for adhering to healthy diet and exercise. Labs were ordered to be later reviewed . All questions were answered.  GYN Dr Radene Knee - mammogram, BDS, no need for PAP he said A cardiac CT scan for calcium scoring offered

## 2018-10-29 NOTE — Assessment & Plan Note (Signed)
vag itching (lichen per Dr Radene Knee)

## 2018-10-30 LAB — URINALYSIS
Bilirubin Urine: NEGATIVE
Ketones, ur: NEGATIVE
Leukocytes,Ua: NEGATIVE
Nitrite: NEGATIVE
Specific Gravity, Urine: 1.01 (ref 1.000–1.030)
Total Protein, Urine: NEGATIVE
Urine Glucose: NEGATIVE
Urobilinogen, UA: 0.2 (ref 0.0–1.0)
pH: 7.5 (ref 5.0–8.0)

## 2018-10-30 LAB — BASIC METABOLIC PANEL
BUN: 19 mg/dL (ref 6–23)
CO2: 28 mEq/L (ref 19–32)
Calcium: 9.8 mg/dL (ref 8.4–10.5)
Chloride: 93 mEq/L — ABNORMAL LOW (ref 96–112)
Creatinine, Ser: 0.8 mg/dL (ref 0.40–1.20)
GFR: 68.6 mL/min (ref >60.00)
Glucose, Bld: 90 mg/dL (ref 70–99)
Potassium: 4 mEq/L (ref 3.5–5.1)
Sodium: 131 mEq/L — ABNORMAL LOW (ref 135–145)

## 2018-10-30 LAB — LIPID PANEL
Cholesterol: 164 mg/dL (ref 0–200)
HDL: 58.1 mg/dL (ref >39.00)
LDL Cholesterol: 72 mg/dL (ref 0–99)
NonHDL: 105.89
Total CHOL/HDL Ratio: 3
Triglycerides: 168 mg/dL — ABNORMAL HIGH (ref 0.0–149.0)
VLDL: 33.6 mg/dL (ref 0.0–40.0)

## 2018-10-30 LAB — HEPATIC FUNCTION PANEL
ALT: 18 U/L (ref 0–35)
AST: 20 U/L (ref 0–37)
Albumin: 4.3 g/dL (ref 3.5–5.2)
Alkaline Phosphatase: 42 U/L (ref 39–117)
Bilirubin, Direct: 0.1 mg/dL (ref 0.0–0.3)
Total Bilirubin: 0.4 mg/dL (ref 0.2–1.2)
Total Protein: 7 g/dL (ref 6.0–8.3)

## 2018-11-04 ENCOUNTER — Other Ambulatory Visit: Payer: Self-pay

## 2018-11-04 DIAGNOSIS — I10 Essential (primary) hypertension: Secondary | ICD-10-CM

## 2018-11-09 DIAGNOSIS — L570 Actinic keratosis: Secondary | ICD-10-CM | POA: Diagnosis not present

## 2018-11-17 ENCOUNTER — Ambulatory Visit (INDEPENDENT_AMBULATORY_CARE_PROVIDER_SITE_OTHER): Payer: Medicare Other | Admitting: Internal Medicine

## 2018-11-17 ENCOUNTER — Encounter: Payer: Self-pay | Admitting: Internal Medicine

## 2018-11-17 ENCOUNTER — Other Ambulatory Visit: Payer: Self-pay

## 2018-11-17 ENCOUNTER — Ambulatory Visit: Payer: Self-pay | Admitting: *Deleted

## 2018-11-17 ENCOUNTER — Other Ambulatory Visit (INDEPENDENT_AMBULATORY_CARE_PROVIDER_SITE_OTHER): Payer: Medicare Other

## 2018-11-17 DIAGNOSIS — R1030 Lower abdominal pain, unspecified: Secondary | ICD-10-CM

## 2018-11-17 DIAGNOSIS — L280 Lichen simplex chronicus: Secondary | ICD-10-CM | POA: Diagnosis not present

## 2018-11-17 LAB — BASIC METABOLIC PANEL
BUN: 25 mg/dL — ABNORMAL HIGH (ref 6–23)
CO2: 28 mEq/L (ref 19–32)
Calcium: 9.6 mg/dL (ref 8.4–10.5)
Chloride: 97 mEq/L (ref 96–112)
Creatinine, Ser: 0.87 mg/dL (ref 0.40–1.20)
GFR: 62.27 mL/min (ref >60.00)
Glucose, Bld: 93 mg/dL (ref 70–99)
Potassium: 3.7 mEq/L (ref 3.5–5.1)
Sodium: 135 mEq/L (ref 135–145)

## 2018-11-17 LAB — CBC WITH DIFFERENTIAL/PLATELET
Basophils Absolute: 0 10*3/uL (ref 0.0–0.1)
Basophils Relative: 0.4 % (ref 0.0–3.0)
Eosinophils Absolute: 0.2 10*3/uL (ref 0.0–0.7)
Eosinophils Relative: 2.3 % (ref 0.0–5.0)
HCT: 37.4 % (ref 36.0–46.0)
Hemoglobin: 12.7 g/dL (ref 12.0–15.0)
Lymphocytes Relative: 32.1 % (ref 12.0–46.0)
Lymphs Abs: 2.2 10*3/uL (ref 0.7–4.0)
MCHC: 34 g/dL (ref 30.0–36.0)
MCV: 93.7 fl (ref 78.0–100.0)
Monocytes Absolute: 0.7 10*3/uL (ref 0.1–1.0)
Monocytes Relative: 9.6 % (ref 3.0–12.0)
Neutro Abs: 3.8 10*3/uL (ref 1.4–7.7)
Neutrophils Relative %: 55.6 % (ref 43.0–77.0)
Platelets: 234 10*3/uL (ref 150.0–400.0)
RBC: 3.99 Mil/uL (ref 3.87–5.11)
RDW: 12.6 % (ref 11.5–15.5)
WBC: 6.8 10*3/uL (ref 4.0–10.5)

## 2018-11-17 LAB — URINALYSIS, ROUTINE W REFLEX MICROSCOPIC
Bilirubin Urine: NEGATIVE
Ketones, ur: NEGATIVE
Leukocytes,Ua: NEGATIVE
Nitrite: NEGATIVE
Specific Gravity, Urine: 1.01 (ref 1.000–1.030)
Total Protein, Urine: NEGATIVE
Urine Glucose: NEGATIVE
Urobilinogen, UA: 0.2 (ref 0.0–1.0)
pH: 7 (ref 5.0–8.0)

## 2018-11-17 LAB — URINALYSIS
Bilirubin Urine: NEGATIVE
Ketones, ur: NEGATIVE
Leukocytes,Ua: NEGATIVE
Nitrite: NEGATIVE
Specific Gravity, Urine: 1.01 (ref 1.000–1.030)
Total Protein, Urine: NEGATIVE
Urine Glucose: NEGATIVE
Urobilinogen, UA: 0.2 (ref 0.0–1.0)
pH: 7 (ref 5.0–8.0)

## 2018-11-17 NOTE — Assessment & Plan Note (Signed)
F/u w/dr Radene Knee

## 2018-11-17 NOTE — Progress Notes (Signed)
Subjective:  Patient ID: Whitney Benson, female    DOB: [birth year]  Age: 84 y.o. MRN: [id number]  CC: No chief complaint on file.   HPI VENOLA FELLING presents for lower abd pain off and on 10/10 would last for seconds x 2-3 times a day  Outpatient Medications Prior to Visit  Medication Sig Dispense Refill   aspirin 81 MG tablet Take 81 mg by mouth daily.       atorvastatin (LIPITOR) 20 MG tablet Take 1 tablet (20 mg total) by mouth daily. 90 tablet 3   cholecalciferol (VITAMIN D) 1000 units tablet Take 1,000 Units by mouth daily.     glucosamine-chondroitin 500-400 MG tablet Take 1 tablet by mouth daily.     metoprolol succinate (TOPROL XL) 50 MG 24 hr tablet Take 1 tablet (50 mg total) by mouth daily. Take with or immediately following a meal. 90 tablet 3   MULTIPLE VITAMIN PO Take by mouth.       nitroGLYCERIN (NITROSTAT) 0.4 MG SL tablet Place 1 tablet (0.4 mg total) under the tongue every 5 (five) minutes as needed for chest pain. 20 tablet 1   pantoprazole (PROTONIX) 40 MG tablet Take 1 tablet (40 mg total) by mouth daily. 90 tablet 3   PARoxetine (PAXIL) 10 MG tablet Take 1 tablet (10 mg total) by mouth daily. 90 tablet 1   triamcinolone ointment (KENALOG) 0.1 % Apply 1 application topically 2 (two) times daily. 80 g 0   valsartan-hydrochlorothiazide (DIOVAN-HCT) 320-25 MG tablet Take 1 tablet by mouth daily. 90 tablet 3   zolpidem (AMBIEN) 5 MG tablet TAKE 1 TABLET BY MOUTH EACH NIGHT AT BEDTIME AS NEEDED FOR SLEEP 90 tablet 1   No facility-administered medications prior to visit.     ROS: Review of Systems  Constitutional: Negative for activity change, appetite change, chills, fatigue and unexpected weight change.  HENT: Negative for congestion, mouth sores and sinus pressure.   Eyes: Negative for visual disturbance.  Respiratory: Negative for cough and chest tightness.   Gastrointestinal: Positive for abdominal pain. Negative for abdominal distention, blood in  stool, nausea and vomiting.  Genitourinary: Negative for difficulty urinating, flank pain, frequency and vaginal pain.  Musculoskeletal: Negative for back pain and gait problem.  Skin: Negative for pallor and rash.  Neurological: Negative for dizziness, tremors, weakness, numbness and headaches.  Psychiatric/Behavioral: Negative for confusion and sleep disturbance.    Objective:  BP 120/78 (BP Location: Right Arm, Patient Position: Sitting, Cuff Size: Normal)    Pulse (!) 57    Temp 98.2 F (36.8 C) (Oral)    Ht 5\' 1"  (1.549 m)    Wt 130 lb (59 kg)    SpO2 96%    BMI 24.56 kg/m   BP Readings from Last 3 Encounters:  11/17/18 120/78  10/29/18 (!) 142/84  02/25/18 (!) 148/84    Wt Readings from Last 3 Encounters:  11/17/18 130 lb (59 kg)  10/29/18 129 lb (58.5 kg)  02/25/18 124 lb (56.2 kg)    Physical Exam Constitutional:      General: She is not in acute distress.    Appearance: She is well-developed.  HENT:     Head: Normocephalic.     Right Ear: External ear normal.     Left Ear: External ear normal.     Nose: Nose normal.  Eyes:     General:        Right eye: No discharge.  Left eye: No discharge.     Conjunctiva/sclera: Conjunctivae normal.     Pupils: Pupils are equal, round, and reactive to light.  Neck:     Musculoskeletal: Normal range of motion and neck supple.     Thyroid: No thyromegaly.     Vascular: No JVD.     Trachea: No tracheal deviation.  Cardiovascular:     Rate and Rhythm: Normal rate and regular rhythm.     Heart sounds: Normal heart sounds.  Pulmonary:     Effort: No respiratory distress.     Breath sounds: No stridor. No wheezing.  Abdominal:     General: Bowel sounds are normal. There is no distension.     Palpations: Abdomen is soft. There is no mass.     Tenderness: There is no abdominal tenderness. There is no guarding or rebound.  Musculoskeletal:        General: No tenderness.  Lymphadenopathy:     Cervical: No cervical  adenopathy.  Skin:    Findings: No erythema or rash.  Neurological:     Cranial Nerves: No cranial nerve deficit.     Motor: No abnormal muscle tone.     Coordination: Coordination normal.     Deep Tendon Reflexes: Reflexes normal.  Psychiatric:        Behavior: Behavior normal.        Thought Content: Thought content normal.        Judgment: Judgment normal.   sensitive lower 1/2 abd  Lab Results  Component Value Date   WBC 7.3 10/29/2018   HGB 13.0 10/29/2018   HCT 38.6 10/29/2018   PLT 244.0 10/29/2018   GLUCOSE 90 10/29/2018   CHOL 164 10/29/2018   TRIG 168.0 (H) 10/29/2018   HDL 58.10 10/29/2018   LDLDIRECT 84.1 07/20/2012   LDLCALC 72 10/29/2018   ALT 18 10/29/2018   AST 20 10/29/2018   NA 131 (L) 10/29/2018   K 4.0 10/29/2018   CL 93 (L) 10/29/2018   CREATININE 0.80 10/29/2018   BUN 19 10/29/2018   CO2 28 10/29/2018   TSH 1.60 10/29/2018    Vas [place] Le Art Seg Multi (segm&le Reynauds)  Result Date: 03/10/2018 LOWER EXTREMITY DOPPLER STUDY Indications: Patient presents today with complaints of left lower leg              claudication symptoms x 6 months. She states after walking to the              her mailbox and back to the house she would have to stop and rest              due to left calf pain. She denies any rest pain. High Risk Factors: Hypertension, hyperlipidemia, past history of smoking.   Performing Technologist: Sharlett Iles RVT  Examination Guidelines: A complete evaluation includes at minimum, Doppler waveform signals and systolic blood pressure reading at the level of bilateral brachial, anterior tibial, and posterior tibial arteries, when vessel segments are accessible. Bilateral testing is considered an integral part of a complete examination. Photoelectric Plethysmograph (PPG) waveforms and toe systolic pressure readings are included as required and additional duplex testing as needed. Limited examinations for reoccurring indications may be performed as  noted.  ABI Findings: +---------+------------------+-----+---------+--------+  Right     Rt Pressure (mmHg) Index Waveform  Comment   +---------+------------------+-----+---------+--------+  Brachial  190                                          +---------+------------------+-----+---------+--------+  CFA                                triphasic           +---------+------------------+-----+---------+--------+  Popliteal                          triphasic           +---------+------------------+-----+---------+--------+  ATA       202                1.02  triphasic           +---------+------------------+-----+---------+--------+  PTA       210                1.06  triphasic           +---------+------------------+-----+---------+--------+  PERO      203                1.03  triphasic           +---------+------------------+-----+---------+--------+  Great Toe 152                0.77  Normal              +---------+------------------+-----+---------+--------+ +---------+------------------+-----+---------+-------+  Left      Lt Pressure (mmHg) Index Waveform  Comment  +---------+------------------+-----+---------+-------+  Brachial  198                                         +---------+------------------+-----+---------+-------+  CFA                                triphasic          +---------+------------------+-----+---------+-------+  Popliteal                          triphasic          +---------+------------------+-----+---------+-------+  ATA       187                0.94  triphasic          +---------+------------------+-----+---------+-------+  PTA       207                1.05  triphasic          +---------+------------------+-----+---------+-------+  PERO      179                0.90  triphasic          +---------+------------------+-----+---------+-------+  Great Toe 129                0.65  Abnormal           +---------+------------------+-----+---------+-------+  +-------+-----------+-----------+------------+------------+  ABI/TBI Today's ABI Today's TBI Previous ABI Previous TBI  +-------+-----------+-----------+------------+------------+  Right   1.06        .77                                    +-------+-----------+-----------+------------+------------+  Left    1.05        .65                                    +-------+-----------+-----------+------------+------------+  Summary: Right: Resting right ankle-brachial index is within normal range. No evidence of significant right lower extremity arterial disease. The right toe-brachial index is normal. Left: Resting left ankle-brachial index is within normal range. No evidence of significant left lower extremity arterial disease. The left toe-brachial index is abnormal. *See table(s) above for measurements and observations.  Suggest follow up LE Arterial Duplex if clinically indicated. Electronically signed by Ida Rogue MD on 03/10/2018 at 9:29:05 AM.    Final     Assessment & Plan:   There are no diagnoses linked to this encounter.   No orders of the defined types were placed in this encounter.    Follow-up: No follow-ups on file.  Walker Kehr, MD

## 2018-11-17 NOTE — Telephone Encounter (Signed)
I returned pt's call.   She is c/o having lower abd pain beneath her naval and to both sides that is sharp at times and is intermittent for a week now.   No N/V/D or urinary symptoms.  See triage notes.  I warm transferred her call to [facility] in Dr. Judeen Hammans office to be scheduled.  I sent my notes to the office.   Reason for Disposition . Age > 60 years  Answer Assessment - Initial Assessment Questions 1. LOCATION: "Where does it hurt?"      Below my belly button I'm having intermittent pain all week. 2. RADIATION: "Does the pain shoot anywhere else?" (e.g., chest, back)     It's sharp pains in my lower abd. 3. ONSET: "When did the pain begin?" (e.g., minutes, hours or days ago)      Last week. 4. SUDDEN: "Gradual or sudden onset?"     Suddenly 5. PATTERN "Does the pain come and go, or is it constant?"    - If constant: "Is it getting better, staying the same, or worsening?"      (Note: Constant means the pain never goes away completely; most serious pain is constant and it progresses)     - If intermittent: "How long does it last?" "Do you have pain now?"     (Note: Intermittent means the pain goes away completely between bouts)     intermittently 6. SEVERITY: "How bad is the pain?"  (e.g., Scale 1-10; mild, moderate, or severe)   - MILD (1-3): doesn't interfere with normal activities, abdomen soft and not tender to touch    - MODERATE (4-7): interferes with normal activities or awakens from sleep, tender to touch    - SEVERE (8-10): excruciating pain, doubled over, unable to do any normal activities      Moderate   I'm using Aleve and pain pills which I never take. 7. RECURRENT SYMPTOM: "Have you ever had this type of abdominal pain before?" If so, ask: "When was the last time?" and "What happened that time?"      No 8. CAUSE: "What do you think is causing the abdominal pain?"     No 9. RELIEVING/AGGRAVATING FACTORS: "What makes it better or worse?" (e.g., movement, antacids,  bowel movement)     No 10. OTHER SYMPTOMS: "Has there been any vomiting, diarrhea, constipation, or urine problems?"       No urinary symptoms.   11. PREGNANCY: "Is there any chance you are pregnant?" "When was your last menstrual period?"       N/A due to age  Protocols used: ABDOMINAL PAIN - [facility]

## 2018-11-18 ENCOUNTER — Ambulatory Visit
Admission: RE | Admit: 2018-11-18 | Discharge: 2018-11-18 | Disposition: A | Discharge status: Home / Self Care | Payer: Medicare Other | Source: Ambulatory Visit | Attending: Internal Medicine | Admitting: Internal Medicine

## 2018-11-18 DIAGNOSIS — I700 Atherosclerosis of aorta: Secondary | ICD-10-CM | POA: Diagnosis not present

## 2018-11-18 DIAGNOSIS — R1030 Lower abdominal pain, unspecified: Secondary | ICD-10-CM

## 2018-11-18 DIAGNOSIS — K5730 Diverticulosis of large intestine without perforation or abscess without bleeding: Secondary | ICD-10-CM | POA: Diagnosis not present

## 2018-11-18 MED ORDER — IOPAMIDOL (ISOVUE-300) INJECTION 61%
100.0000 mL | Freq: Once | INTRAVENOUS | Status: AC | PRN
  Administered 2018-11-18: 100 mL via INTRAVENOUS

## 2018-12-01 ENCOUNTER — Encounter: Payer: Self-pay | Admitting: Internal Medicine

## 2018-12-01 ENCOUNTER — Ambulatory Visit (INDEPENDENT_AMBULATORY_CARE_PROVIDER_SITE_OTHER): Payer: Medicare Other | Admitting: Internal Medicine

## 2018-12-01 ENCOUNTER — Other Ambulatory Visit: Payer: Self-pay

## 2018-12-01 DIAGNOSIS — K5790 Diverticulosis of intestine, part unspecified, without perforation or abscess without bleeding: Secondary | ICD-10-CM | POA: Insufficient documentation

## 2018-12-01 DIAGNOSIS — R911 Solitary pulmonary nodule: Secondary | ICD-10-CM | POA: Insufficient documentation

## 2018-12-01 DIAGNOSIS — K5901 Slow transit constipation: Secondary | ICD-10-CM

## 2018-12-01 MED ORDER — POLYETHYLENE GLYCOL 3350 17 GM/SCOOP PO POWD: 17.0000 g | Freq: Two times a day (BID) | ORAL | 3 refills | Status: DC | PRN

## 2018-12-01 NOTE — Assessment & Plan Note (Signed)
9/20 abd CT: Potential LEFT lower lobe pulmonary nodule versus vascular structure. Recommend CT of the thorax to further evaluate - discussed

## 2018-12-01 NOTE — Assessment & Plan Note (Signed)
Diverticulosis on CT 10/20 Miralax prn

## 2018-12-01 NOTE — Progress Notes (Signed)
Subjective:  Patient ID: Whitney Benson, female    DOB: June 11, 1934  Age: 84 y.o. MRN: [id number]  CC: No chief complaint on file.   HPI Whitney Benson presents for abd pain,  LEFT lower lobe pulmonary nodule versus vascular structure. C/o constipation  Outpatient Medications Prior to Visit  Medication Sig Dispense Refill   aspirin 81 MG tablet Take 81 mg by mouth daily.       atorvastatin (LIPITOR) 20 MG tablet Take 1 tablet (20 mg total) by mouth daily. 90 tablet 3   cholecalciferol (VITAMIN D) 1000 units tablet Take 1,000 Units by mouth daily.     glucosamine-chondroitin 500-400 MG tablet Take 1 tablet by mouth daily.     metoprolol succinate (TOPROL XL) 50 MG 24 hr tablet Take 1 tablet (50 mg total) by mouth daily. Take with or immediately following a meal. 90 tablet 3   MULTIPLE VITAMIN PO Take by mouth.       nitroGLYCERIN (NITROSTAT) 0.4 MG SL tablet Place 1 tablet (0.4 mg total) under the tongue every 5 (five) minutes as needed for chest pain. 20 tablet 1   pantoprazole (PROTONIX) 40 MG tablet Take 1 tablet (40 mg total) by mouth daily. 90 tablet 3   PARoxetine (PAXIL) 10 MG tablet Take 1 tablet (10 mg total) by mouth daily. 90 tablet 1   triamcinolone ointment (KENALOG) 0.1 % Apply 1 application topically 2 (two) times daily. 80 g 0   valsartan-hydrochlorothiazide (DIOVAN-HCT) 320-25 MG tablet Take 1 tablet by mouth daily. 90 tablet 3   zolpidem (AMBIEN) 5 MG tablet TAKE 1 TABLET BY MOUTH EACH NIGHT AT BEDTIME AS NEEDED FOR SLEEP 90 tablet 1   No facility-administered medications prior to visit.     ROS: Review of Systems  Constitutional: Positive for fatigue. Negative for activity change, appetite change, chills and unexpected weight change.  HENT: Negative for congestion, mouth sores and sinus pressure.   Eyes: Negative for visual disturbance.  Respiratory: Negative for cough and chest tightness.   Gastrointestinal: Positive for abdominal pain and constipation.  Negative for nausea.  Genitourinary: Negative for difficulty urinating, frequency and vaginal pain.  Musculoskeletal: Negative for back pain and gait problem.  Skin: Negative for pallor and rash.  Neurological: Negative for dizziness, tremors, weakness, numbness and headaches.  Psychiatric/Behavioral: Negative for confusion and sleep disturbance.    Objective:  BP 126/80 (BP Location: Left Arm, Patient Position: Sitting, Cuff Size: Normal)    Pulse 60    Temp 98 F (36.7 C) (Oral)    Ht 5\' 1"  (1.549 m)    Wt 130 lb (59 kg)    SpO2 97%    BMI 24.56 kg/m   BP Readings from Last 3 Encounters:  12/01/18 126/80  11/17/18 120/78  10/29/18 (!) 142/84    Wt Readings from Last 3 Encounters:  12/01/18 130 lb (59 kg)  11/17/18 130 lb (59 kg)  10/29/18 129 lb (58.5 kg)    Physical Exam Constitutional:      General: She is not in acute distress.    Appearance: She is well-developed.  HENT:     Head: Normocephalic.     Right Ear: External ear normal.     Left Ear: External ear normal.     Nose: Nose normal.  Eyes:     General:        Right eye: No discharge.        Left eye: No discharge.     Conjunctiva/sclera: Conjunctivae  normal.     Pupils: Pupils are equal, round, and reactive to light.  Neck:     Musculoskeletal: Normal range of motion and neck supple.     Thyroid: No thyromegaly.     Vascular: No JVD.     Trachea: No tracheal deviation.  Cardiovascular:     Rate and Rhythm: Normal rate and regular rhythm.     Heart sounds: Normal heart sounds.  Pulmonary:     Effort: No respiratory distress.     Breath sounds: No stridor. No wheezing.  Abdominal:     General: Bowel sounds are normal. There is no distension.     Palpations: Abdomen is soft. There is no mass.     Tenderness: There is no abdominal tenderness. There is no guarding or rebound.  Musculoskeletal:        General: No tenderness.  Lymphadenopathy:     Cervical: No cervical adenopathy.  Skin:    Findings: No  erythema or rash.  Neurological:     Mental Status: She is oriented to person, place, and time.     Cranial Nerves: No cranial nerve deficit.     Motor: No abnormal muscle tone.     Coordination: Coordination normal.     Deep Tendon Reflexes: Reflexes normal.  Psychiatric:        Behavior: Behavior normal.        Thought Content: Thought content normal.        Judgment: Judgment normal.     Lab Results  Component Value Date   WBC 6.8 11/17/2018   HGB 12.7 11/17/2018   HCT 37.4 11/17/2018   PLT 234.0 11/17/2018   GLUCOSE 93 11/17/2018   CHOL 164 10/29/2018   TRIG 168.0 (H) 10/29/2018   HDL 58.10 10/29/2018   LDLDIRECT 84.1 07/20/2012   LDLCALC 72 10/29/2018   ALT 18 10/29/2018   AST 20 10/29/2018   NA 135 11/17/2018   K 3.7 11/17/2018   CL 97 11/17/2018   CREATININE 0.87 11/17/2018   BUN 25 (H) 11/17/2018   CO2 28 11/17/2018   TSH 1.60 10/29/2018    Ct Abdomen Pelvis W Contrast  Result Date: 11/18/2018 CLINICAL DATA:  Abdominal pain. Diverticulitis suspected. Severe abdominal pain. EXAM: CT ABDOMEN AND PELVIS WITH CONTRAST TECHNIQUE: Multidetector CT imaging of the abdomen and pelvis was performed using the standard protocol following bolus administration of intravenous contrast. CONTRAST:  134mL ISOVUE-300 IOPAMIDOL (ISOVUE-300) INJECTION 61% COMPARISON:  None. FINDINGS: Lower chest: 7 mm potential nodule adjacent to the central LEFT lobe bronchus (image 20/2) Hepatobiliary: No focal hepatic lesion. No biliary duct dilatation. Gallbladder is normal. Common bile duct is normal. Pancreas: Pancreas is normal. No ductal dilatation. No pancreatic inflammation. Spleen: Normal spleen Adrenals/urinary tract: Adrenal glands and kidneys are normal. The ureters and bladder normal. Stomach/Bowel: Stomach, small bowel, appendix, and cecum are normal. Several diverticula sigmoid colon without acute inflammation Vascular/Lymphatic: Abdominal aorta is normal caliber with atherosclerotic  calcification. There is no retroperitoneal or periportal lymphadenopathy. No pelvic lymphadenopathy. Reproductive: Uterus and ovaries normal. Other: No free fluid.  No inguinal hernia Musculoskeletal: Degenerative endplate changes in the lumbar spine. Aggressive osseous lesion IMPRESSION: 1. No acute findings in the abdomen or pelvis. 2. Sigmoid diverticulosis without evidence diverticulitis. 3. Degenerative endplate change in the lumbar spine. 4. Potential LEFT lower lobe pulmonary nodule versus vascular structure. Recommend CT of the thorax to further evaluate. Electronically Signed   By: Suzy Bouchard M.D.   On: 11/18/2018 16:08  Assessment & Plan:   Diagnoses and all orders for this visit:  Slow transit constipation  Diverticulosis  Other orders -     polyethylene glycol powder (GLYCOLAX/MIRALAX) 17 GM/SCOOP powder; Take 17 g by mouth 2 (two) times daily as needed for mild constipation or moderate constipation.     Meds ordered this encounter  Medications   polyethylene glycol powder (GLYCOLAX/MIRALAX) 17 GM/SCOOP powder    Sig: Take 17 g by mouth 2 (two) times daily as needed for mild constipation or moderate constipation.    Dispense:  850 g    Refill:  3     Follow-up: No follow-ups on file.  Walker Kehr, MD

## 2018-12-01 NOTE — Patient Instructions (Addendum)
Activated charcoal capsules for gas Diverticulosis  Diverticulosis is a condition that develops when small pouches (diverticula) form in the wall of the large intestine (colon). The colon is where water is absorbed and stool is formed. The pouches form when the inside layer of the colon pushes through weak spots in the outer layers of the colon. You may have a few pouches or many of them. What are the causes? The cause of this condition is not known. What increases the risk? The following factors may make you more likely to develop this condition:  Being older than age 29. Your risk for this condition increases with age. Diverticulosis is rare among people younger than age 46. By age 82, many people have it.  Eating a low-fiber diet.  Having frequent constipation.  Being overweight.  Not getting enough exercise.  Smoking.  Taking over-the-counter pain medicines, like aspirin and ibuprofen.  Having a family history of diverticulosis. What are the signs or symptoms? In most people, there are no symptoms of this condition. If you do have symptoms, they may include:  Bloating.  Cramps in the abdomen.  Constipation or diarrhea.  Pain in the lower left side of the abdomen. How is this diagnosed? This condition is most often diagnosed during an exam for other colon problems. Because diverticulosis usually has no symptoms, it often cannot be diagnosed independently. This condition may be diagnosed by:  Using a flexible scope to examine the colon (colonoscopy).  Taking an X-ray of the colon after dye has been put into the colon (barium enema).  Doing a CT scan. How is this treated? You may not need treatment for this condition if you have never developed an infection related to diverticulosis. If you have had an infection before, treatment may include:  Eating a high-fiber diet. This may include eating more fruits, vegetables, and grains.  Taking a fiber supplement.  Taking a  live bacteria supplement (probiotic).  Taking medicine to relax your colon.  Taking antibiotic medicines. Follow these instructions at home:  Drink 6-8 glasses of water or more each day to prevent constipation.  Try not to strain when you have a bowel movement.  If you have had an infection before: ? Eat more fiber as directed by your health care provider or your diet and nutrition specialist (dietitian). ? Take a fiber supplement or probiotic, if your health care provider approves.  Take over-the-counter and prescription medicines only as told by your health care provider.  If you were prescribed an antibiotic, take it as told by your health care provider. Do not stop taking the antibiotic even if you start to feel better.  Keep all follow-up visits as told by your health care provider. This is important. Contact a health care provider if:  You have pain in your abdomen.  You have bloating.  You have cramps.  You have not had a bowel movement in 3 days. Get help right away if:  Your pain gets worse.  Your bloating becomes very bad.  You have a fever or chills, and your symptoms suddenly get worse.  You vomit.  You have bowel movements that are bloody or black.  You have bleeding from your rectum. Summary  Diverticulosis is a condition that develops when small pouches (diverticula) form in the wall of the large intestine (colon).  You may have a few pouches or many of them.  This condition is most often diagnosed during an exam for other colon problems.  If you have  had an infection related to diverticulosis, treatment may include increasing the fiber in your diet, taking supplements, or taking medicines. This information is not intended to replace advice given to you by your health care provider. Make sure you discuss any questions you have with your health care provider. Document Released: 11/02/2003 Document Revised: 01/17/2017 Document Reviewed:  12/25/2015 Elsevier Patient Education  2020 [employer].

## 2018-12-15 ENCOUNTER — Ambulatory Visit (INDEPENDENT_AMBULATORY_CARE_PROVIDER_SITE_OTHER)
Admission: RE | Admit: 2018-12-15 | Discharge: 2018-12-15 | Disposition: A | Discharge status: Home / Self Care | Payer: Medicare Other | Source: Ambulatory Visit | Attending: Internal Medicine | Admitting: Internal Medicine

## 2018-12-15 ENCOUNTER — Other Ambulatory Visit: Payer: Self-pay

## 2018-12-15 DIAGNOSIS — R911 Solitary pulmonary nodule: Secondary | ICD-10-CM

## 2019-01-25 DIAGNOSIS — Z1231 Encounter for screening mammogram for malignant neoplasm of breast: Secondary | ICD-10-CM | POA: Diagnosis not present

## 2019-02-20 ENCOUNTER — Other Ambulatory Visit: Payer: Self-pay | Admitting: Internal Medicine

## 2019-03-08 ENCOUNTER — Encounter: Payer: Self-pay | Admitting: Internal Medicine

## 2019-03-08 ENCOUNTER — Other Ambulatory Visit: Payer: Self-pay

## 2019-03-08 ENCOUNTER — Ambulatory Visit (INDEPENDENT_AMBULATORY_CARE_PROVIDER_SITE_OTHER): Payer: Medicare Other | Admitting: Internal Medicine

## 2019-03-08 DIAGNOSIS — F418 Other specified anxiety disorders: Secondary | ICD-10-CM | POA: Diagnosis not present

## 2019-03-08 DIAGNOSIS — E785 Hyperlipidemia, unspecified: Secondary | ICD-10-CM

## 2019-03-08 DIAGNOSIS — G4700 Insomnia, unspecified: Secondary | ICD-10-CM | POA: Diagnosis not present

## 2019-03-08 DIAGNOSIS — R911 Solitary pulmonary nodule: Secondary | ICD-10-CM | POA: Diagnosis not present

## 2019-03-08 DIAGNOSIS — F411 Generalized anxiety disorder: Secondary | ICD-10-CM

## 2019-03-08 MED ORDER — TRIAMCINOLONE ACETONIDE 0.1 % EX OINT: 1.0000 "application " | TOPICAL_OINTMENT | Freq: Two times a day (BID) | CUTANEOUS | 2 refills | Status: DC

## 2019-03-08 NOTE — Assessment & Plan Note (Addendum)
CT reviewed Due repeat CT summer 2021

## 2019-03-08 NOTE — Assessment & Plan Note (Signed)
On Lipitor 

## 2019-03-08 NOTE — Progress Notes (Signed)
Subjective:  Patient ID: Whitney Benson, female    DOB: Sep 08, 1934  Age: 85 y.o. MRN: [id number]  CC: No chief complaint on file.   HPI Whitney Benson presents for constipation, HTN, dyslipidemia f/u  Outpatient Medications Prior to Visit  Medication Sig Dispense Refill  . aspirin 81 MG tablet Take 81 mg by mouth daily.      Marland Kitchen atorvastatin (LIPITOR) 20 MG tablet Take 1 tablet (20 mg total) by mouth daily. 90 tablet 3  . cholecalciferol (VITAMIN D) 1000 units tablet Take 1,000 Units by mouth daily.    . diazepam (VALIUM) 5 MG tablet TAKE 1 TABLET BY MOUTH EVERY 6 HOURS AS NEEDED FOR ANXIETY DO NOT TAKE WITH ZOLPIDEM 30 tablet 3  . glucosamine-chondroitin 500-400 MG tablet Take 1 tablet by mouth daily.    . metoprolol succinate (TOPROL XL) 50 MG 24 hr tablet Take 1 tablet (50 mg total) by mouth daily. Take with or immediately following a meal. 90 tablet 3  . MULTIPLE VITAMIN PO Take by mouth.      . nitroGLYCERIN (NITROSTAT) 0.4 MG SL tablet Place 1 tablet (0.4 mg total) under the tongue every 5 (five) minutes as needed for chest pain. 20 tablet 1  . pantoprazole (PROTONIX) 40 MG tablet Take 1 tablet (40 mg total) by mouth daily. 90 tablet 3  . PARoxetine (PAXIL) 10 MG tablet Take 1 tablet (10 mg total) by mouth daily. 90 tablet 1  . polyethylene glycol powder (GLYCOLAX/MIRALAX) 17 GM/SCOOP powder Take 17 g by mouth 2 (two) times daily as needed for mild constipation or moderate constipation. 850 g 3  . triamcinolone ointment (KENALOG) 0.1 % Apply 1 application topically 2 (two) times daily. 80 g 0  . valsartan-hydrochlorothiazide (DIOVAN-HCT) 320-25 MG tablet Take 1 tablet by mouth daily. 90 tablet 3  . zolpidem (AMBIEN) 5 MG tablet TAKE 1 TABLET BY MOUTH EACH NIGHT AT BEDTIME AS NEEDED FOR SLEEP 90 tablet 1   No facility-administered medications prior to visit.    ROS: Review of Systems  Constitutional: Negative for activity change, appetite change, chills, fatigue and unexpected  weight change.  HENT: Negative for congestion, mouth sores and sinus pressure.   Eyes: Negative for visual disturbance.  Respiratory: Negative for cough and chest tightness.   Gastrointestinal: Positive for constipation. Negative for abdominal pain and nausea.  Genitourinary: Negative for difficulty urinating, frequency and vaginal pain.  Musculoskeletal: Positive for arthralgias. Negative for back pain and gait problem.  Skin: Negative for pallor and rash.  Neurological: Negative for dizziness, tremors, weakness, numbness and headaches.  Psychiatric/Behavioral: Positive for sleep disturbance. Negative for confusion.    Objective:  BP 122/84 (BP Location: Left Arm, Patient Position: Sitting, Cuff Size: Normal)   Pulse 63   Temp 98 F (36.7 C) (Oral)   Ht 5\' 1"  (1.549 m)   Wt 126 lb (57.2 kg)   SpO2 95%   BMI 23.81 kg/m   BP Readings from Last 3 Encounters:  03/08/19 122/84  12/01/18 126/80  11/17/18 120/78    Wt Readings from Last 3 Encounters:  03/08/19 126 lb (57.2 kg)  12/01/18 130 lb (59 kg)  11/17/18 130 lb (59 kg)    Physical Exam Constitutional:      General: She is not in acute distress.    Appearance: She is well-developed.  HENT:     Head: Normocephalic.     Right Ear: External ear normal.     Left Ear: External ear normal.  Nose: Nose normal.  Eyes:     General:        Right eye: No discharge.        Left eye: No discharge.     Conjunctiva/sclera: Conjunctivae normal.     Pupils: Pupils are equal, round, and reactive to light.  Neck:     Thyroid: No thyromegaly.     Vascular: No JVD.     Trachea: No tracheal deviation.  Cardiovascular:     Rate and Rhythm: Normal rate and regular rhythm.     Heart sounds: Normal heart sounds.  Pulmonary:     Effort: No respiratory distress.     Breath sounds: No stridor. No wheezing.  Abdominal:     General: Bowel sounds are normal. There is no distension.     Palpations: Abdomen is soft. There is no mass.       Tenderness: There is no abdominal tenderness. There is no guarding or rebound.  Musculoskeletal:        General: No tenderness.     Cervical back: Normal range of motion and neck supple.  Lymphadenopathy:     Cervical: No cervical adenopathy.  Skin:    Findings: No erythema or rash.  Neurological:     Cranial Nerves: No cranial nerve deficit.     Motor: No abnormal muscle tone.     Coordination: Coordination normal.     Deep Tendon Reflexes: Reflexes normal.  Psychiatric:        Behavior: Behavior normal.        Thought Content: Thought content normal.        Judgment: Judgment normal.     Lab Results  Component Value Date   WBC 6.8 11/17/2018   HGB 12.7 11/17/2018   HCT 37.4 11/17/2018   PLT 234.0 11/17/2018   GLUCOSE 93 11/17/2018   CHOL 164 10/29/2018   TRIG 168.0 (H) 10/29/2018   HDL 58.10 10/29/2018   LDLDIRECT 84.1 07/20/2012   LDLCALC 72 10/29/2018   ALT 18 10/29/2018   AST 20 10/29/2018   NA 135 11/17/2018   K 3.7 11/17/2018   CL 97 11/17/2018   CREATININE 0.87 11/17/2018   BUN 25 (H) 11/17/2018   CO2 28 11/17/2018   TSH 1.60 10/29/2018    CT Chest Wo Contrast  Result Date: 12/15/2018 CLINICAL DATA:  85 year old female with history of pneumonia. EXAM: CT CHEST WITHOUT CONTRAST TECHNIQUE: Multidetector CT imaging of the chest was performed following the standard protocol without IV contrast. COMPARISON:  No priors. FINDINGS: Cardiovascular: Heart size is normal. There is no significant pericardial fluid, thickening or pericardial calcification. Aortic atherosclerosis. No coronary artery calcifications. Mediastinum/Nodes: No pathologically enlarged mediastinal or hilar lymph nodes. Please note that accurate exclusion of hilar adenopathy is limited on noncontrast CT scans. Multiple densely calcified mediastinal and left hilar lymph nodes. Esophagus is unremarkable in appearance. No axillary lymphadenopathy. Lungs/Pleura: 9 mm ground-glass attenuation nodule in  the right upper lobe abutting the minor fissure (axial image 63 of series 3). Calcified granuloma in the superior segment of the left lower lobe. No other suspicious appearing pulmonary nodules or masses are noted. No acute consolidative airspace disease. No pleural effusions. Upper Abdomen: Aortic atherosclerosis. Musculoskeletal: There are no aggressive appearing lytic or blastic lesions noted in the visualized portions of the skeleton. IMPRESSION: 1. No acute findings in the thorax. 2. 9 mm ground-glass attenuation nodule in the inferior aspect of the right upper lobe, nonspecific. Initial follow-up with CT at 6-12 months is recommended  to confirm persistence. If persistent, repeat CT is recommended every 2 years until 5 years of stability has been established. This recommendation follows the consensus statement: Guidelines for Management of Incidental Pulmonary Nodules Detected on CT Images: From the Fleischner Society 2017; Radiology 2017; 284:228-243. 3. Aortic atherosclerosis. Aortic Atherosclerosis (ICD10-I70.0). Electronically Signed   By: Vinnie Langton M.D.   On: 12/15/2018 16:38    Assessment & Plan:   There are no diagnoses linked to this encounter.   No orders of the defined types were placed in this encounter.    Follow-up: No follow-ups on file.  Walker Kehr, MD

## 2019-03-08 NOTE — Assessment & Plan Note (Signed)
Paxil 

## 2019-03-08 NOTE — Assessment & Plan Note (Signed)
Cont w/Paxil 

## 2019-03-08 NOTE — Assessment & Plan Note (Signed)
Zolpidem prn  Potential benefits of a long term benzodiazepines  use as well as potential risks  and complications were explained to the patient and were aknowledged. 

## 2019-03-08 NOTE — Patient Instructions (Addendum)
Activated charcoal capsules for gas  ------------------------------------------------------------------------------  We are committed to keeping you informed about the COVID-19 vaccine.  As the vaccine continues to become available for each phase, we will ensure that patients who meet the criteria receive the information they need to access vaccination opportunities. Continue to check your MyChart account and [email or web address] for updates. Please review the Phase 1b information below.  Following [place]'s guidelines for the distribution of COVID-19 vaccines we are pleased to share our plans to begin offering vaccines to those 65 and older (Phase 1b). Here are details of those plans:  [place] On Tuesday, Jan. 19, the [facility]) and [place] begin large-scale COVID-19 vaccinations at the [place]. The vaccinations are appointment only and for those 73 and older.  Walk-ins will not be accepted.  All appointments are currently filled. Please join our waiting list for the next available appointments. We will contact you when appointments become available. Please do not sign up more than once.  Join Our Waiting List   Other Vaccination Opportunities in [place] We are also working in partnership with county health agencies in our service counties to ensure continuing vaccination availability in the weeks and months ahead. Learn more about each county's vaccination efforts in the website links below:   [place] 's phase 1b vaccination guidelines, prioritizing those 65 and over as the next eligible group to receive the COVID-19 vaccine, are detailed at [email or web address].   Vaccine Safety and Effectiveness Clinical trials for the Pfizer COVID-19 vaccine involved 42,000 people and showed  that the vaccine is more than 95% effective in preventing COVID-19 with no serious safety concerns. Similar results have been reported for the Moderna COVID-19 vaccine. Side effects reported in the [place] clinical trials include a sore arm at the injection site, fatigue, headache, chills and fever. While side effects from the [place] COVID-19 vaccine are higher than for a typical flu vaccine, they are lower in many ways than side effects from the leading vaccine to prevent shingles. Side effects are signs that a vaccine is working and are related to your immune system being stimulated to produce antibodies against infection. Side effects from vaccination are far less significant than health impacts from COVID-19.  Staying Informed Pharmacists, infectious disease doctors, critical care nurses and other experts at [facility] continue to speak publicly through media interviews and direct communication with our patients and communities about the safety, effectiveness and importance of vaccines to eliminate COVID-19. In addition, reliable information on vaccine safety, effectiveness, side effects and more is available on the following websites:  [place] Department of Health and Human Services COVID-19 Vaccine Information Website.  U.S. Centers for Disease Control and Prevention [id number] Human resources officer.  Staying Safe We agree with the CDC on what we can do to help our communities get back to normal: Getting "back to normal" is going to take all of our tools. If we use all the tools we have, we stand the best chance of getting our families, communities, schools and workplaces "back to normal" sooner:  Get vaccinated as soon as vaccines become available within the phase of the state's vaccination rollout plan for which you meet the eligibility criteria.  Wear a mask.  Stay 6 feet from others and avoid crowds.  Wash hands often.  For our most current information, please visit  [email or web address].

## 2019-03-25 DIAGNOSIS — M8588 Other specified disorders of bone density and structure, other site: Secondary | ICD-10-CM | POA: Diagnosis not present

## 2019-04-06 DIAGNOSIS — M8580 Other specified disorders of bone density and structure, unspecified site: Secondary | ICD-10-CM | POA: Diagnosis not present

## 2019-04-29 ENCOUNTER — Encounter: Payer: Self-pay | Admitting: Family

## 2019-04-29 ENCOUNTER — Ambulatory Visit (INDEPENDENT_AMBULATORY_CARE_PROVIDER_SITE_OTHER): Payer: Medicare Other | Admitting: Family

## 2019-04-29 ENCOUNTER — Other Ambulatory Visit: Payer: Self-pay

## 2019-04-29 VITALS — BP 128/80 | HR 57 | Temp 98.1°F | Ht 61.0 in | Wt 130.0 lb

## 2019-04-29 DIAGNOSIS — M545 Low back pain, unspecified: Secondary | ICD-10-CM

## 2019-04-29 MED ORDER — NAPROXEN 500 MG PO TABS: 500.0000 mg | ORAL_TABLET | Freq: Two times a day (BID) | ORAL | 0 refills | Status: DC

## 2019-04-29 MED ORDER — TIZANIDINE HCL 2 MG PO CAPS: 2.0000 mg | ORAL_CAPSULE | Freq: Every evening | ORAL | 0 refills | Status: DC | PRN

## 2019-04-29 NOTE — Progress Notes (Signed)
Whitney Benson is a 85 y.o. female with the following history as recorded in EpicCare:  Patient Active Problem List   Diagnosis Date Noted  . Diverticulosis 12/01/2018  . Lung nodule 12/01/2018  . Lichen [id number]  . Constipation 07/28/2018  . Claudication ([place]) 02/25/2018  . Contusion of iliac crest 01/05/2018  . Grief 08/19/2017  . Dog bite 08/19/2017  . Lower respiratory infection 05/21/2016  . Anxiety disorder 03/09/2015  . Chest pain 03/01/2015  . GERD (gastroesophageal reflux disease) 03/01/2015  . Pain in joint, shoulder region 01/06/2014  . Myalgia and myositis 12/28/2013  . CAP (community acquired pneumonia) 12/16/2013  . Well adult exam 07/23/2013  . Routine health maintenance 11/13/2010  . UNSPECIFIED NEURALGIA NEURITIS AND RADICULITIS 04/28/2007  . BACK PAIN, LUMBAR 03/27/2007  . POLYP, COLON 03/17/2007  . Dyslipidemia 03/17/2007  . Situational anxiety 03/17/2007  . Essential hypertension 03/17/2007  . Asthma 03/17/2007  . DEGENERATIVE JOINT DISEASE 03/17/2007  . INSOMNIA 03/17/2007  . DILATION AND CURETTAGE, HX OF 03/17/2007    Current Outpatient Medications  Medication Sig Dispense Refill  . alendronate (FOSAMAX) 70 MG tablet Take 70 mg by mouth once a week.    Marland Kitchen aspirin 81 MG tablet Take 81 mg by mouth daily.      Marland Kitchen atorvastatin (LIPITOR) 20 MG tablet Take 1 tablet (20 mg total) by mouth daily. 90 tablet 3  . cholecalciferol (VITAMIN D) 1000 units tablet Take 1,000 Units by mouth daily.    . clobetasol cream (TEMOVATE) 0.05 %     . diazepam (VALIUM) 5 MG tablet TAKE 1 TABLET BY MOUTH EVERY 6 HOURS AS NEEDED FOR ANXIETY DO NOT TAKE WITH ZOLPIDEM 30 tablet 3  . glucosamine-chondroitin 500-400 MG tablet Take 1 tablet by mouth daily.    . metoprolol succinate (TOPROL XL) 50 MG 24 hr tablet Take 1 tablet (50 mg total) by mouth daily. Take with or immediately following a meal. 90 tablet 3  . MULTIPLE VITAMIN PO Take by mouth.      . nitroGLYCERIN (NITROSTAT) 0.4  MG SL tablet Place 1 tablet (0.4 mg total) under the tongue every 5 (five) minutes as needed for chest pain. 20 tablet 1  . pantoprazole (PROTONIX) 40 MG tablet Take 1 tablet (40 mg total) by mouth daily. (Patient taking differently: Take 40 mg by mouth daily as needed. ) 90 tablet 3  . PARoxetine (PAXIL) 10 MG tablet Take 1 tablet (10 mg total) by mouth daily. 90 tablet 1  . polyethylene glycol powder (GLYCOLAX/MIRALAX) 17 GM/SCOOP powder Take 17 g by mouth 2 (two) times daily as needed for mild constipation or moderate constipation. 850 g 3  . triamcinolone ointment (KENALOG) 0.1 % Apply 1 application topically 2 (two) times daily. 80 g 2  . valsartan-hydrochlorothiazide (DIOVAN-HCT) 320-25 MG tablet Take 1 tablet by mouth daily. 90 tablet 3  . zolpidem (AMBIEN) 5 MG tablet TAKE 1 TABLET BY MOUTH EACH NIGHT AT BEDTIME AS NEEDED FOR SLEEP 90 tablet 1  . naproxen (NAPROSYN) 500 MG tablet Take 1 tablet (500 mg total) by mouth 2 (two) times daily with a meal. 20 tablet 0  . tizanidine (ZANAFLEX) 2 MG capsule Take 1 capsule (2 mg total) by mouth at bedtime as needed for muscle spasms. 20 capsule 0   No current facility-administered medications for this visit.    Allergies: Hydrocodone  Past Medical History:  Diagnosis Date  . DJD (degenerative joint disease)   . History of asthma   . History  of colon polyps   . Hyperlipidemia   . Hypertension   . Insomnia     Past Surgical History:  Procedure Laterality Date  . DILATION AND CURETTAGE OF UTERUS    . OTHER SURGICAL HISTORY  09/2011   mammogram and bone density  . POLYPECTOMY    . TUBAL LIGATION      Family History  Problem Relation Age of Onset  . Cancer Mother   . Hypertension Mother   . Cancer Father     Social History   Tobacco Use  . Smoking status: Former Research scientist (life sciences)  . Smokeless tobacco: Never Used  Substance Use Topics  . Alcohol use: Yes    Comment: wine    Subjective:  Left sided low back pain; pushed a washing machine  yesterday and thinks she aggravated her low back; no radiating pain into lower extremities; has been using Tylenol with limited relief; does actually feel better with movement.      Objective:  Vitals:   04/29/19 0908  BP: 128/80  Pulse: (!) 57  Temp: 98.1 F (36.7 C)  TempSrc: Oral  SpO2: 97%  Weight: 130 lb (59 kg)  Height: 5\' 1"  (1.549 m)    General: Well developed, well nourished, in no acute distress  Skin : Warm and dry.  Head: Normocephalic and atraumatic  Lungs: Respirations unlabored;  Musculoskeletal: No deformities; no active joint inflammation  Extremities: No edema, cyanosis, clubbing  Vessels: Symmetric bilaterally  Neurologic: Alert and oriented; speech intact; face symmetrical; moves all extremities well; CNII-XII intact without focal deficit   Assessment:  1. Acute left-sided low back pain without sciatica     Plan:  Rx for Naproxen 500 mg bid prn and Tizanidine 2 mg qhs; hold Ambien while on Tizanidine; rest and apply heat; follow-up worse, no better.  This visit occurred during the SARS-CoV-2 public health emergency.  Safety protocols were in place, including screening questions prior to the visit, additional usage of staff PPE, and extensive cleaning of exam room while observing appropriate contact time as indicated for disinfecting solutions.     No follow-ups on file.  No orders of the defined types were placed in this encounter.   Requested Prescriptions   Signed Prescriptions Disp Refills  . naproxen (NAPROSYN) 500 MG tablet 20 tablet 0    Sig: Take 1 tablet (500 mg total) by mouth 2 (two) times daily with a meal.  . tizanidine (ZANAFLEX) 2 MG capsule 20 capsule 0    Sig: Take 1 capsule (2 mg total) by mouth at bedtime as needed for muscle spasms.

## 2019-04-29 NOTE — Patient Instructions (Signed)
Please do not take the Ambien while you are taking the muscle relaxer ( Tizanidine) at night; if your back is not better by Monday, please call back.

## 2019-05-17 ENCOUNTER — Other Ambulatory Visit: Payer: Self-pay | Admitting: Internal Medicine

## 2019-05-28 ENCOUNTER — Other Ambulatory Visit: Payer: Self-pay | Admitting: Internal Medicine

## 2019-06-23 DIAGNOSIS — L930 Discoid lupus erythematosus: Secondary | ICD-10-CM | POA: Diagnosis not present

## 2019-09-06 ENCOUNTER — Ambulatory Visit (INDEPENDENT_AMBULATORY_CARE_PROVIDER_SITE_OTHER): Payer: Medicare Other | Admitting: Internal Medicine

## 2019-09-06 ENCOUNTER — Encounter: Payer: Self-pay | Admitting: Internal Medicine

## 2019-09-06 ENCOUNTER — Other Ambulatory Visit: Payer: Self-pay

## 2019-09-06 VITALS — BP 130/78 | HR 62 | Temp 98.1°F | Ht 61.0 in | Wt 130.0 lb

## 2019-09-06 DIAGNOSIS — I10 Essential (primary) hypertension: Secondary | ICD-10-CM

## 2019-09-06 DIAGNOSIS — M65312 Trigger thumb, left thumb: Secondary | ICD-10-CM | POA: Diagnosis not present

## 2019-09-06 DIAGNOSIS — F411 Generalized anxiety disorder: Secondary | ICD-10-CM | POA: Diagnosis not present

## 2019-09-06 DIAGNOSIS — E785 Hyperlipidemia, unspecified: Secondary | ICD-10-CM | POA: Diagnosis not present

## 2019-09-06 DIAGNOSIS — F418 Other specified anxiety disorders: Secondary | ICD-10-CM

## 2019-09-06 DIAGNOSIS — M65319 Trigger thumb, unspecified thumb: Secondary | ICD-10-CM | POA: Insufficient documentation

## 2019-09-06 DIAGNOSIS — Z0000 Encounter for general adult medical examination without abnormal findings: Secondary | ICD-10-CM

## 2019-09-06 MED ORDER — DICLOFENAC SODIUM 1 % EX GEL: 1.0000 "application " | Freq: Four times a day (QID) | CUTANEOUS | 3 refills | Status: DC

## 2019-09-06 NOTE — Progress Notes (Signed)
Subjective:  Patient ID: Whitney Benson, female    DOB: May 09, 1934  Age: 85 y.o. MRN: [id number]  CC: No chief complaint on file.   HPI Whitney Benson presents for dyslipidemia, osteoporosis, depression f/u C/o L trigger thumb C/o LLE weakness when walking  Outpatient Medications Prior to Visit  Medication Sig Dispense Refill  . alendronate (FOSAMAX) 70 MG tablet Take 70 mg by mouth once a week.    Marland Kitchen aspirin 81 MG tablet Take 81 mg by mouth daily.      Marland Kitchen atorvastatin (LIPITOR) 20 MG tablet Take 1 tablet (20 mg total) by mouth daily. 90 tablet 3  . cholecalciferol (VITAMIN D) 1000 units tablet Take 1,000 Units by mouth daily.    . diazepam (VALIUM) 5 MG tablet TAKE 1 TABLET BY MOUTH EVERY 6 HOURS AS NEEDED FOR ANXIETY DO NOT TAKE WITH ZOLPIDEM 30 tablet 3  . metoprolol succinate (TOPROL XL) 50 MG 24 hr tablet Take 1 tablet (50 mg total) by mouth daily. Take with or immediately following a meal. 90 tablet 3  . PARoxetine (PAXIL) 10 MG tablet TAKE 1 TABLET BY MOUTH DAILY 90 tablet 1  . polyethylene glycol powder (GLYCOLAX/MIRALAX) 17 GM/SCOOP powder Take 17 g by mouth 2 (two) times daily as needed for mild constipation or moderate constipation. 850 g 3  . zolpidem (AMBIEN) 5 MG tablet TAKE 1 TABLET BY MOUTH DAILY AT BEDTIME AS NEEDED FOR SLEEP 90 tablet 1  . clobetasol cream (TEMOVATE) 0.05 %  (Patient not taking: Reported on 09/06/2019)    . glucosamine-chondroitin 500-400 MG tablet Take 1 tablet by mouth daily. (Patient not taking: Reported on 09/06/2019)    . MULTIPLE VITAMIN PO Take by mouth.   (Patient not taking: Reported on 09/06/2019)    . naproxen (NAPROSYN) 500 MG tablet Take 1 tablet (500 mg total) by mouth 2 (two) times daily with a meal. (Patient not taking: Reported on 09/06/2019) 20 tablet 0  . nitroGLYCERIN (NITROSTAT) 0.4 MG SL tablet Place 1 tablet (0.4 mg total) under the tongue every 5 (five) minutes as needed for chest pain. (Patient not taking: Reported on 09/06/2019) 20  tablet 1  . pantoprazole (PROTONIX) 40 MG tablet Take 1 tablet (40 mg total) by mouth daily. (Patient not taking: Reported on 09/06/2019) 90 tablet 3  . tizanidine (ZANAFLEX) 2 MG capsule Take 1 capsule (2 mg total) by mouth at bedtime as needed for muscle spasms. (Patient not taking: Reported on 09/06/2019) 20 capsule 0  . triamcinolone ointment (KENALOG) 0.1 % Apply 1 application topically 2 (two) times daily. (Patient not taking: Reported on 09/06/2019) 80 g 2  . valsartan-hydrochlorothiazide (DIOVAN-HCT) 320-25 MG tablet Take 1 tablet by mouth daily. (Patient not taking: Reported on 09/06/2019) 90 tablet 3   No facility-administered medications prior to visit.    ROS: Review of Systems  Constitutional: Negative for activity change, appetite change, chills, fatigue and unexpected weight change.  HENT: Negative for congestion, mouth sores and sinus pressure.   Eyes: Negative for visual disturbance.  Respiratory: Negative for cough and chest tightness.   Gastrointestinal: Negative for abdominal pain and nausea.  Genitourinary: Negative for difficulty urinating, frequency and vaginal pain.  Musculoskeletal: Positive for arthralgias and back pain. Negative for gait problem.  Skin: Negative for pallor and rash.  Neurological: Negative for dizziness, tremors, weakness, numbness and headaches.  Psychiatric/Behavioral: Negative for confusion and sleep disturbance.    Objective:  BP 130/78 (BP Location: Right Arm, Patient Position: Sitting, Cuff Size:  Normal)   Pulse 62   Temp 98.1 F (36.7 C) (Oral)   Ht 5\' 1"  (1.549 m)   Wt 130 lb (59 kg)   SpO2 96%   BMI 24.56 kg/m   BP Readings from Last 3 Encounters:  09/06/19 130/78  04/29/19 128/80  03/08/19 122/84    Wt Readings from Last 3 Encounters:  09/06/19 130 lb (59 kg)  04/29/19 130 lb (59 kg)  03/08/19 126 lb (57.2 kg)    Physical Exam Constitutional:      General: She is not in acute distress.    Appearance: She is  well-developed.  HENT:     Head: Normocephalic.     Right Ear: External ear normal.     Left Ear: External ear normal.     Nose: Nose normal.  Eyes:     General:        Right eye: No discharge.        Left eye: No discharge.     Conjunctiva/sclera: Conjunctivae normal.     Pupils: Pupils are equal, round, and reactive to light.  Neck:     Thyroid: No thyromegaly.     Vascular: No JVD.     Trachea: No tracheal deviation.  Cardiovascular:     Rate and Rhythm: Normal rate and regular rhythm.     Heart sounds: Normal heart sounds.  Pulmonary:     Effort: No respiratory distress.     Breath sounds: No stridor. No wheezing.  Abdominal:     General: Bowel sounds are normal. There is no distension.     Palpations: Abdomen is soft. There is no mass.     Tenderness: There is no abdominal tenderness. There is no guarding or rebound.  Musculoskeletal:        General: Tenderness present.     Cervical back: Normal range of motion and neck supple.  Lymphadenopathy:     Cervical: No cervical adenopathy.  Skin:    Findings: No erythema or rash.  Neurological:     Cranial Nerves: No cranial nerve deficit.     Motor: No abnormal muscle tone.     Coordination: Coordination normal.     Deep Tendon Reflexes: Reflexes normal.  Psychiatric:        Behavior: Behavior normal.        Thought Content: Thought content normal.        Judgment: Judgment normal.   L thumb is triggering   Lab Results  Component Value Date   WBC 6.8 11/17/2018   HGB 12.7 11/17/2018   HCT 37.4 11/17/2018   PLT 234.0 11/17/2018   GLUCOSE 93 11/17/2018   CHOL 164 10/29/2018   TRIG 168.0 (H) 10/29/2018   HDL 58.10 10/29/2018   LDLDIRECT 84.1 07/20/2012   LDLCALC 72 10/29/2018   ALT 18 10/29/2018   AST 20 10/29/2018   NA 135 11/17/2018   K 3.7 11/17/2018   CL 97 11/17/2018   CREATININE 0.87 11/17/2018   BUN 25 (H) 11/17/2018   CO2 28 11/17/2018   TSH 1.60 10/29/2018    CT Chest Wo Contrast  Result  Date: 12/15/2018 CLINICAL DATA:  85 year old female with history of pneumonia. EXAM: CT CHEST WITHOUT CONTRAST TECHNIQUE: Multidetector CT imaging of the chest was performed following the standard protocol without IV contrast. COMPARISON:  No priors. FINDINGS: Cardiovascular: Heart size is normal. There is no significant pericardial fluid, thickening or pericardial calcification. Aortic atherosclerosis. No coronary artery calcifications. Mediastinum/Nodes: No pathologically enlarged mediastinal or hilar  lymph nodes. Please note that accurate exclusion of hilar adenopathy is limited on noncontrast CT scans. Multiple densely calcified mediastinal and left hilar lymph nodes. Esophagus is unremarkable in appearance. No axillary lymphadenopathy. Lungs/Pleura: 9 mm ground-glass attenuation nodule in the right upper lobe abutting the minor fissure (axial image 63 of series 3). Calcified granuloma in the superior segment of the left lower lobe. No other suspicious appearing pulmonary nodules or masses are noted. No acute consolidative airspace disease. No pleural effusions. Upper Abdomen: Aortic atherosclerosis. Musculoskeletal: There are no aggressive appearing lytic or blastic lesions noted in the visualized portions of the skeleton. IMPRESSION: 1. No acute findings in the thorax. 2. 9 mm ground-glass attenuation nodule in the inferior aspect of the right upper lobe, nonspecific. Initial follow-up with CT at 6-12 months is recommended to confirm persistence. If persistent, repeat CT is recommended every 2 years until 5 years of stability has been established. This recommendation follows the consensus statement: Guidelines for Management of Incidental Pulmonary Nodules Detected on CT Images: From the Fleischner Society 2017; Radiology 2017; 284:228-243. 3. Aortic atherosclerosis. Aortic Atherosclerosis (ICD10-I70.0). Electronically Signed   By: Vinnie Langton M.D.   On: 12/15/2018 16:38    Assessment & Plan:     Walker Kehr, MD

## 2019-09-06 NOTE — Assessment & Plan Note (Signed)
Use Votaren gel Inj offered

## 2019-09-06 NOTE — Assessment & Plan Note (Signed)
Paxil 

## 2019-09-06 NOTE — Assessment & Plan Note (Signed)
BP Readings from Last 3 Encounters:  09/06/19 130/78  04/29/19 128/80  03/08/19 122/84

## 2019-09-06 NOTE — Assessment & Plan Note (Signed)
Labs

## 2019-10-28 DIAGNOSIS — H5203 Hypermetropia, bilateral: Secondary | ICD-10-CM | POA: Diagnosis not present

## 2019-10-28 DIAGNOSIS — H52203 Unspecified astigmatism, bilateral: Secondary | ICD-10-CM | POA: Diagnosis not present

## 2019-10-28 DIAGNOSIS — Z961 Presence of intraocular lens: Secondary | ICD-10-CM | POA: Diagnosis not present

## 2019-10-28 DIAGNOSIS — H524 Presbyopia: Secondary | ICD-10-CM | POA: Diagnosis not present

## 2019-11-01 ENCOUNTER — Other Ambulatory Visit (INDEPENDENT_AMBULATORY_CARE_PROVIDER_SITE_OTHER): Payer: Medicare Other

## 2019-11-01 DIAGNOSIS — Z0000 Encounter for general adult medical examination without abnormal findings: Secondary | ICD-10-CM

## 2019-11-01 DIAGNOSIS — I10 Essential (primary) hypertension: Secondary | ICD-10-CM

## 2019-11-01 LAB — URINALYSIS
Bilirubin Urine: NEGATIVE
Ketones, ur: NEGATIVE
Leukocytes,Ua: NEGATIVE
Nitrite: NEGATIVE
Specific Gravity, Urine: 1.01 (ref 1.000–1.030)
Total Protein, Urine: NEGATIVE
Urine Glucose: NEGATIVE
Urobilinogen, UA: 0.2 (ref 0.0–1.0)
pH: 7.5 (ref 5.0–8.0)

## 2019-11-01 LAB — BASIC METABOLIC PANEL WITH GFR
BUN: 19 mg/dL (ref 6–23)
CO2: 27 meq/L (ref 19–32)
Calcium: 9.2 mg/dL (ref 8.4–10.5)
Chloride: 97 meq/L (ref 96–112)
Creatinine, Ser: 0.78 mg/dL (ref 0.40–1.20)
GFR: 70.46 mL/min
Glucose, Bld: 95 mg/dL (ref 70–99)
Potassium: 3.7 meq/L (ref 3.5–5.1)
Sodium: 133 meq/L — ABNORMAL LOW (ref 135–145)

## 2019-11-01 LAB — LIPID PANEL
Cholesterol: 153 mg/dL (ref 0–200)
HDL: 63 mg/dL (ref >39.00)
LDL Cholesterol: 67 mg/dL (ref 0–99)
NonHDL: 89.53
Total CHOL/HDL Ratio: 2
Triglycerides: 115 mg/dL (ref 0.0–149.0)
VLDL: 23 mg/dL (ref 0.0–40.0)

## 2019-11-01 LAB — CBC WITH DIFFERENTIAL/PLATELET
Basophils Absolute: 0 10*3/uL (ref 0.0–0.1)
Basophils Relative: 0.7 % (ref 0.0–3.0)
Eosinophils Absolute: 0.1 10*3/uL (ref 0.0–0.7)
Eosinophils Relative: 2.4 % (ref 0.0–5.0)
HCT: 38 % (ref 36.0–46.0)
Hemoglobin: 13 g/dL (ref 12.0–15.0)
Lymphocytes Relative: 34.8 % (ref 12.0–46.0)
Lymphs Abs: 2.1 10*3/uL (ref 0.7–4.0)
MCHC: 34.2 g/dL (ref 30.0–36.0)
MCV: 93.7 fl (ref 78.0–100.0)
Monocytes Absolute: 0.6 10*3/uL (ref 0.1–1.0)
Monocytes Relative: 10.1 % (ref 3.0–12.0)
Neutro Abs: 3.2 10*3/uL (ref 1.4–7.7)
Neutrophils Relative %: 52 % (ref 43.0–77.0)
Platelets: 197 10*3/uL (ref 150.0–400.0)
RBC: 4.06 Mil/uL (ref 3.87–5.11)
RDW: 12.7 % (ref 11.5–15.5)
WBC: 6.1 10*3/uL (ref 4.0–10.5)

## 2019-11-01 LAB — HEPATIC FUNCTION PANEL
ALT: 15 U/L (ref 0–35)
AST: 18 U/L (ref 0–37)
Albumin: 4.3 g/dL (ref 3.5–5.2)
Alkaline Phosphatase: 35 U/L — ABNORMAL LOW (ref 39–117)
Bilirubin, Direct: 0.1 mg/dL (ref 0.0–0.3)
Total Bilirubin: 0.7 mg/dL (ref 0.2–1.2)
Total Protein: 6.7 g/dL (ref 6.0–8.3)

## 2019-11-01 LAB — TSH: TSH: 1.65 u[IU]/mL (ref 0.35–4.50)

## 2019-11-01 NOTE — Addendum Note (Signed)
Addended by: Lerry Liner on: 11/01/2019 08:31 AM   Modules accepted: Orders

## 2019-11-09 DIAGNOSIS — L900 Lichen sclerosus et atrophicus: Secondary | ICD-10-CM | POA: Diagnosis not present

## 2019-11-18 ENCOUNTER — Other Ambulatory Visit: Payer: Self-pay | Admitting: Internal Medicine

## 2019-11-24 ENCOUNTER — Other Ambulatory Visit: Payer: Self-pay | Admitting: Internal Medicine

## 2019-11-24 MED ORDER — VALSARTAN-HYDROCHLOROTHIAZIDE 320-25 MG PO TABS: 1.0000 | ORAL_TABLET | Freq: Every day | ORAL | 3 refills | Status: DC

## 2019-11-24 NOTE — Addendum Note (Signed)
Addended by: Earnstine Regal on: 11/24/2019 09:12 AM   Modules accepted: Orders

## 2019-11-24 NOTE — Telephone Encounter (Signed)
Pls advise on Zolpidem.Marland KitchenJohny Benson

## 2019-11-24 NOTE — Telephone Encounter (Signed)
Resent to pof../lmb 

## 2020-01-27 ENCOUNTER — Other Ambulatory Visit: Payer: Self-pay | Admitting: Internal Medicine

## 2020-01-31 DIAGNOSIS — Z1231 Encounter for screening mammogram for malignant neoplasm of breast: Secondary | ICD-10-CM | POA: Diagnosis not present

## 2020-01-31 DIAGNOSIS — Z6823 Body mass index (BMI) 23.0-23.9, adult: Secondary | ICD-10-CM | POA: Diagnosis not present

## 2020-02-14 ENCOUNTER — Other Ambulatory Visit: Payer: Self-pay | Admitting: Internal Medicine

## 2020-03-07 ENCOUNTER — Other Ambulatory Visit: Payer: Self-pay

## 2020-03-07 ENCOUNTER — Encounter: Payer: Self-pay | Admitting: Internal Medicine

## 2020-03-07 ENCOUNTER — Ambulatory Visit (INDEPENDENT_AMBULATORY_CARE_PROVIDER_SITE_OTHER): Payer: Medicare Other | Admitting: Internal Medicine

## 2020-03-07 DIAGNOSIS — E785 Hyperlipidemia, unspecified: Secondary | ICD-10-CM

## 2020-03-07 DIAGNOSIS — F411 Generalized anxiety disorder: Secondary | ICD-10-CM | POA: Diagnosis not present

## 2020-03-07 DIAGNOSIS — R21 Rash and other nonspecific skin eruption: Secondary | ICD-10-CM

## 2020-03-07 DIAGNOSIS — I10 Essential (primary) hypertension: Secondary | ICD-10-CM

## 2020-03-07 DIAGNOSIS — F418 Other specified anxiety disorders: Secondary | ICD-10-CM

## 2020-03-07 MED ORDER — PANTOPRAZOLE SODIUM 40 MG PO TBEC
40.0000 mg | DELAYED_RELEASE_TABLET | Freq: Every day | ORAL | 3 refills | Status: DC
Start: 2020-03-07 — End: 2021-01-22

## 2020-03-07 MED ORDER — VALSARTAN-HYDROCHLOROTHIAZIDE 160-12.5 MG PO TABS: 1.0000 | ORAL_TABLET | Freq: Every day | ORAL | 3 refills | Status: DC

## 2020-03-07 MED ORDER — TRIAMCINOLONE ACETONIDE 0.1 % EX OINT
1.0000 | TOPICAL_OINTMENT | Freq: Two times a day (BID) | CUTANEOUS | 2 refills | Status: DC
Start: 2020-03-07 — End: 2021-01-22

## 2020-03-07 NOTE — Assessment & Plan Note (Signed)
On paxil

## 2020-03-07 NOTE — Assessment & Plan Note (Signed)
On Paxil 

## 2020-03-07 NOTE — Assessment & Plan Note (Addendum)
On Atorvastatin - will continue.

## 2020-03-07 NOTE — Assessment & Plan Note (Signed)
Valsartan HCT reduced to 160-12.5 due to lightheadedness Toprol

## 2020-03-07 NOTE — Assessment & Plan Note (Addendum)
Face - new rash Triamc oint

## 2020-03-07 NOTE — Patient Instructions (Signed)
We reduced the dose of your Valsartan HCT

## 2020-03-07 NOTE — Progress Notes (Signed)
Subjective:  Patient ID: Whitney Benson, female    DOB: September 11, 1934  Age: 86 y.o. MRN: [id number]  CC: Follow-up (6 month f/u)   HPI RAMEEN GOHLKE presents for lightheadedness when standing up C/o lips patchy swelling. She used a face mask prior  F/u HTN C/o knee OA  Outpatient Medications Prior to Visit  Medication Sig Dispense Refill  . alendronate (FOSAMAX) 70 MG tablet Take 70 mg by mouth once a week.    Marland Kitchen aspirin 81 MG tablet Take 81 mg by mouth daily.    Marland Kitchen atorvastatin (LIPITOR) 20 MG tablet TAKE 1 TABLET BY MOUTH DAILY 90 tablet 2  . cholecalciferol (VITAMIN D) 1000 units tablet Take 1,000 Units by mouth daily.    . diazepam (VALIUM) 5 MG tablet TAKE 1 TABLET BY MOUTH EVERY 6 HOURS AS NEEDED FOR ANXIETY DO NOT TAKE WITH ZOLPIDEM 30 tablet 3  . diclofenac Sodium (VOLTAREN) 1 % GEL Apply 1 application topically 4 (four) times daily. 100 g 3  . glucosamine-chondroitin 500-400 MG tablet Take 1 tablet by mouth daily.    . metoprolol succinate (TOPROL-XL) 50 MG 24 hr tablet TAKE 1 TABLET BY MOUTH DAILY WITH A MEAL 90 tablet 2  . MULTIPLE VITAMIN PO Take by mouth.    . nitroGLYCERIN (NITROSTAT) 0.4 MG SL tablet Place 1 tablet (0.4 mg total) under the tongue every 5 (five) minutes as needed for chest pain. 20 tablet 1  . pantoprazole (PROTONIX) 40 MG tablet TAKE 1 TABLET BY MOUTH DAILY 90 tablet 3  . PARoxetine (PAXIL) 10 MG tablet TAKE 1 TABLET BY MOUTH DAILY 90 tablet 1  . polyethylene glycol powder (GLYCOLAX/MIRALAX) 17 GM/SCOOP powder Take 17 g by mouth 2 (two) times daily as needed for mild constipation or moderate constipation. 850 g 3  . triamcinolone ointment (KENALOG) 0.1 % Apply 1 application topically 2 (two) times daily. 80 g 2  . valsartan-hydrochlorothiazide (DIOVAN-HCT) 320-25 MG tablet Take 1 tablet by mouth daily. 90 tablet 3  . zolpidem (AMBIEN) 5 MG tablet TAKE 1 TABLET BY MOUTH DAILY AT BEDTIME AS NEEDED FOR SLEEP 90 tablet 1  . clobetasol cream (TEMOVATE) 0.05 %   (Patient not taking: No sig reported)    . naproxen (NAPROSYN) 500 MG tablet Take 1 tablet (500 mg total) by mouth 2 (two) times daily with a meal. (Patient not taking: No sig reported) 20 tablet 0  . tizanidine (ZANAFLEX) 2 MG capsule Take 1 capsule (2 mg total) by mouth at bedtime as needed for muscle spasms. (Patient not taking: No sig reported) 20 capsule 0   No facility-administered medications prior to visit.    ROS: Review of Systems  Constitutional: Negative for activity change, appetite change, chills, fatigue and unexpected weight change.  HENT: Negative for congestion, mouth sores and sinus pressure.   Eyes: Negative for visual disturbance.  Respiratory: Negative for cough and chest tightness.   Gastrointestinal: Negative for abdominal pain and nausea.  Genitourinary: Negative for difficulty urinating, frequency and vaginal pain.  Musculoskeletal: Positive for arthralgias. Negative for back pain and gait problem.  Skin: Positive for rash. Negative for pallor.  Neurological: Positive for light-headedness. Negative for dizziness, tremors, weakness, numbness and headaches.  Psychiatric/Behavioral: Negative for confusion and sleep disturbance.    Objective:  BP 132/84 (BP Location: Left Arm)   Pulse 61   Temp 98.2 F (36.8 C) (Oral)   Wt 127 lb (57.6 kg)   SpO2 98%   BMI 24.00 kg/m  BP Readings from Last 3 Encounters:  03/07/20 132/84  09/06/19 130/78  04/29/19 128/80    Wt Readings from Last 3 Encounters:  03/07/20 127 lb (57.6 kg)  09/06/19 130 lb (59 kg)  04/29/19 130 lb (59 kg)    Physical Exam Constitutional:      General: She is not in acute distress.    Appearance: Normal appearance. She is well-developed.  HENT:     Head: Normocephalic.     Right Ear: External ear normal.     Left Ear: External ear normal.     Nose: Nose normal.     Mouth/Throat:     Mouth: Oropharynx is clear and moist.  Eyes:     General:        Right eye: No discharge.         Left eye: No discharge.     Conjunctiva/sclera: Conjunctivae normal.     Pupils: Pupils are equal, round, and reactive to light.  Neck:     Thyroid: No thyromegaly.     Vascular: No JVD.     Trachea: No tracheal deviation.  Cardiovascular:     Rate and Rhythm: Normal rate and regular rhythm.     Heart sounds: Normal heart sounds.  Pulmonary:     Effort: No respiratory distress.     Breath sounds: No stridor. No wheezing.  Abdominal:     General: Bowel sounds are normal. There is no distension.     Palpations: Abdomen is soft. There is no mass.     Tenderness: There is no abdominal tenderness. There is no guarding or rebound.  Musculoskeletal:        General: Tenderness present. No edema.     Cervical back: Normal range of motion and neck supple.  Lymphadenopathy:     Cervical: No cervical adenopathy.  Skin:    Findings: Rash present. No erythema.  Neurological:     Cranial Nerves: No cranial nerve deficit.     Motor: No abnormal muscle tone.     Coordination: Coordination normal.     Deep Tendon Reflexes: Reflexes normal.  Psychiatric:        Mood and Affect: Mood and affect normal.        Behavior: Behavior normal.        Thought Content: Thought content normal.        Judgment: Judgment normal.   rash on chin, lips  Lab Results  Component Value Date   WBC 6.1 11/01/2019   HGB 13.0 11/01/2019   HCT 38.0 11/01/2019   PLT 197.0 11/01/2019   GLUCOSE 95 11/01/2019   CHOL 153 11/01/2019   TRIG 115.0 11/01/2019   HDL 63.00 11/01/2019   LDLDIRECT 84.1 07/20/2012   LDLCALC 67 11/01/2019   ALT 15 11/01/2019   AST 18 11/01/2019   NA 133 (L) 11/01/2019   K 3.7 11/01/2019   CL 97 11/01/2019   CREATININE 0.78 11/01/2019   BUN 19 11/01/2019   CO2 27 11/01/2019   TSH 1.65 11/01/2019    CT Chest Wo Contrast  Result Date: 12/15/2018 CLINICAL DATA:  86 year old female with history of pneumonia. EXAM: CT CHEST WITHOUT CONTRAST TECHNIQUE: Multidetector CT imaging of the  chest was performed following the standard protocol without IV contrast. COMPARISON:  No priors. FINDINGS: Cardiovascular: Heart size is normal. There is no significant pericardial fluid, thickening or pericardial calcification. Aortic atherosclerosis. No coronary artery calcifications. Mediastinum/Nodes: No pathologically enlarged mediastinal or hilar lymph nodes. Please note that accurate exclusion  of hilar adenopathy is limited on noncontrast CT scans. Multiple densely calcified mediastinal and left hilar lymph nodes. Esophagus is unremarkable in appearance. No axillary lymphadenopathy. Lungs/Pleura: 9 mm ground-glass attenuation nodule in the right upper lobe abutting the minor fissure (axial image 63 of series 3). Calcified granuloma in the superior segment of the left lower lobe. No other suspicious appearing pulmonary nodules or masses are noted. No acute consolidative airspace disease. No pleural effusions. Upper Abdomen: Aortic atherosclerosis. Musculoskeletal: There are no aggressive appearing lytic or blastic lesions noted in the visualized portions of the skeleton. IMPRESSION: 1. No acute findings in the thorax. 2. 9 mm ground-glass attenuation nodule in the inferior aspect of the right upper lobe, nonspecific. Initial follow-up with CT at 6-12 months is recommended to confirm persistence. If persistent, repeat CT is recommended every 2 years until 5 years of stability has been established. This recommendation follows the consensus statement: Guidelines for Management of Incidental Pulmonary Nodules Detected on CT Images: From the Fleischner Society 2017; Radiology 2017; 284:228-243. 3. Aortic atherosclerosis. Aortic Atherosclerosis (ICD10-I70.0). Electronically Signed   By: Vinnie Langton M.D.   On: 12/15/2018 16:38    Assessment & Plan:    Follow-up: No follow-ups on file.  Walker Kehr, MD

## 2020-03-15 ENCOUNTER — Other Ambulatory Visit: Payer: Self-pay | Admitting: Internal Medicine

## 2020-05-15 ENCOUNTER — Other Ambulatory Visit: Payer: Self-pay | Admitting: Internal Medicine

## 2020-05-15 NOTE — Telephone Encounter (Signed)
Check [place] registry last filled zolpidem 02/21/2020.Marland KitchenJohny Benson

## 2020-05-16 IMAGING — DX DG LUMBAR SPINE 2-3V
3 series · 3 of 3 positions shown · non-contrast
Comparison: Radiographs November 15, 2003.

CLINICAL DATA: Lower back and bilateral lower extremity pain after
fall last month.

EXAM:
LUMBAR SPINE - 2-3 VIEW

[l-spine ap]
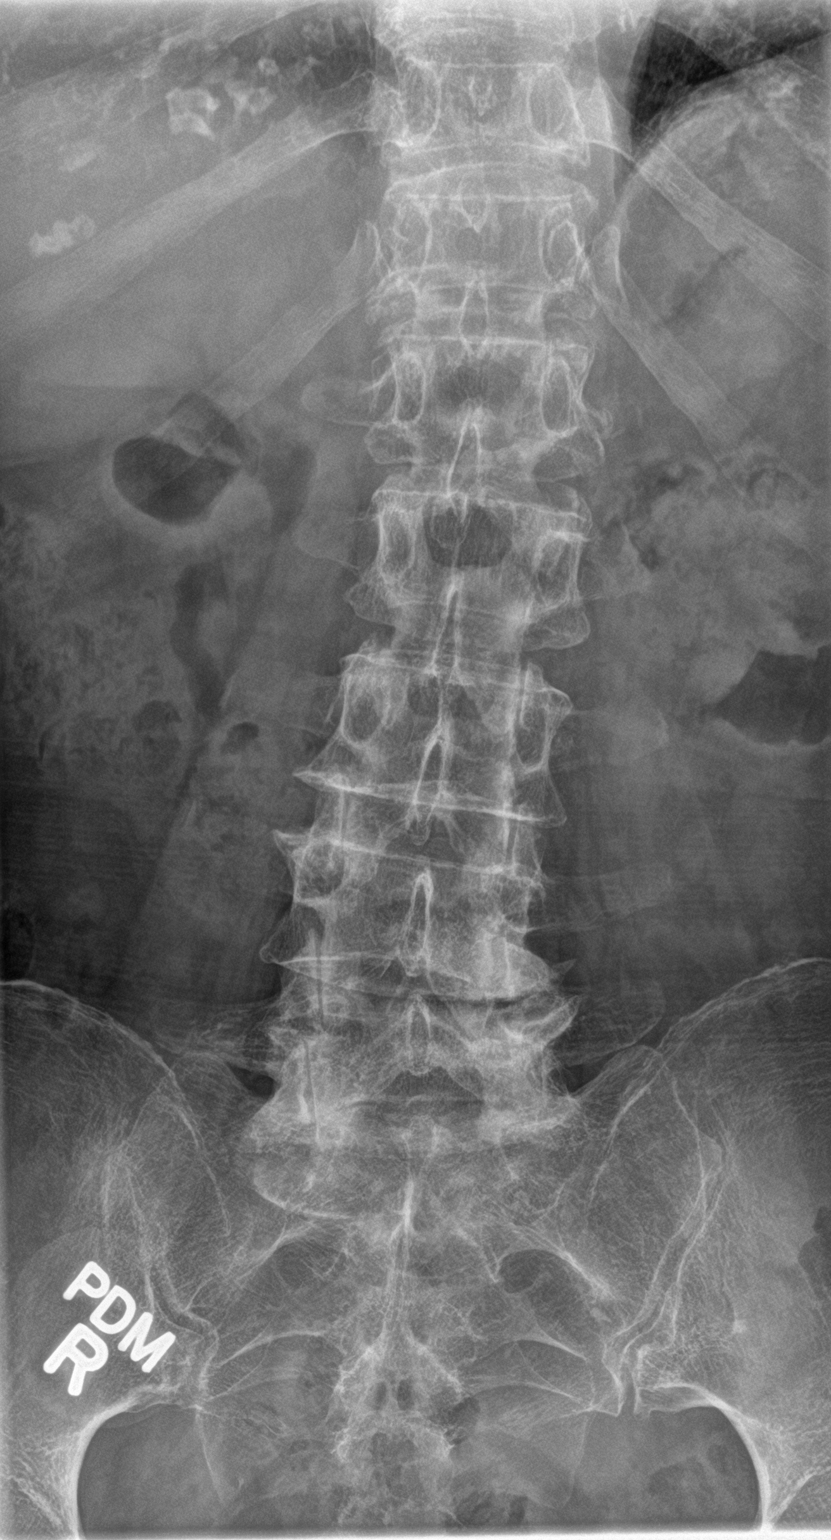

[l-spine lat]
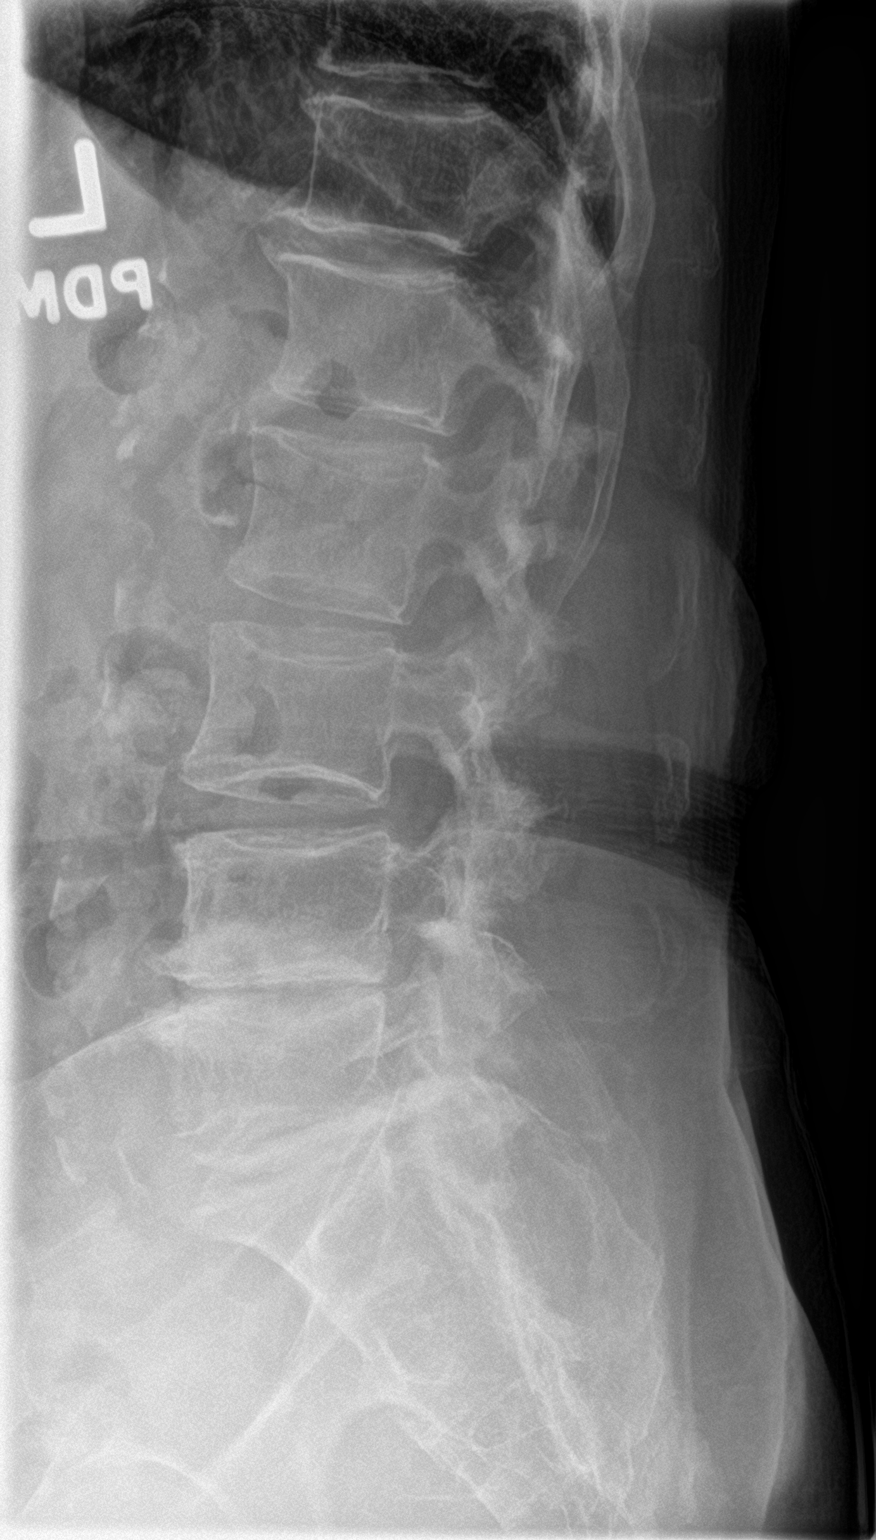

[l-spine spot]
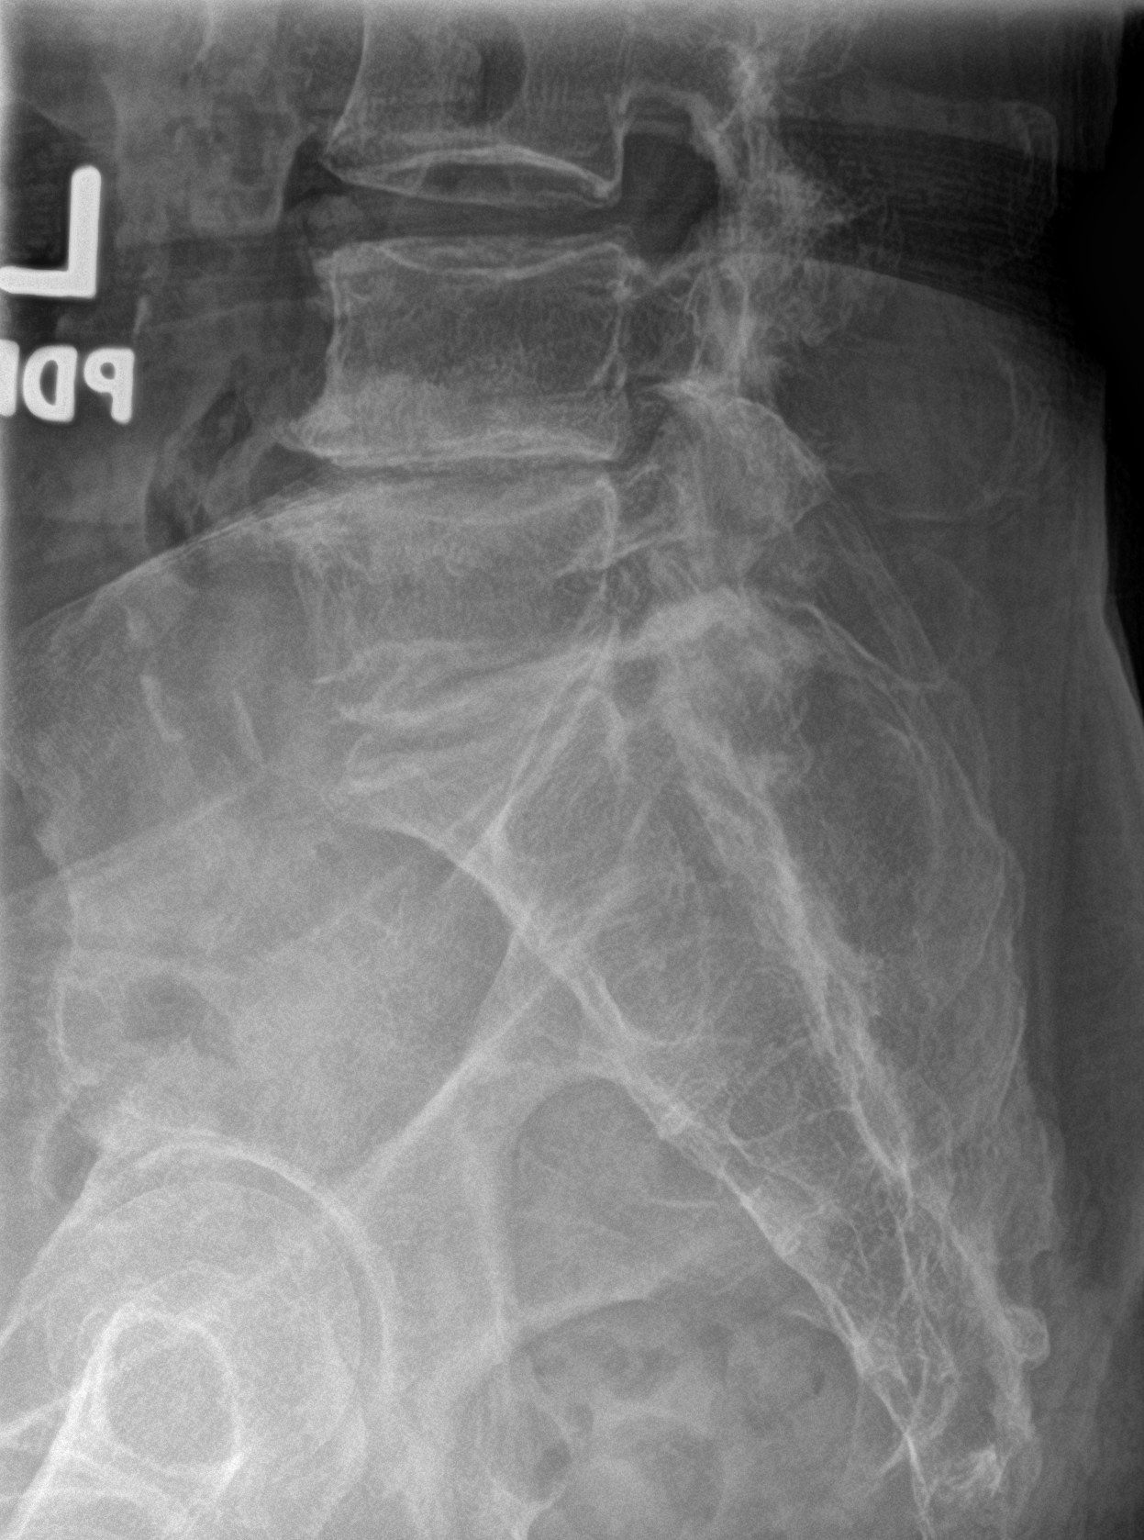

[3 of 3 positions shown; findings below may reference images not displayed]

FINDINGS: No fracture or spondylolisthesis is noted. Moderate degenerative
disc disease is noted at L4-5. Remaining disc spaces are
unremarkable. Atherosclerosis of abdominal aorta is noted.
IMPRESSION: Moderate degenerative disc disease is noted at L4-5. No acute
abnormality seen in the lumbar spine.

Aortic Atherosclerosis ([id number]).

## 2020-05-23 DIAGNOSIS — C44519 Basal cell carcinoma of skin of other part of trunk: Secondary | ICD-10-CM | POA: Diagnosis not present

## 2020-05-23 DIAGNOSIS — L821 Other seborrheic keratosis: Secondary | ICD-10-CM | POA: Diagnosis not present

## 2020-05-23 DIAGNOSIS — L930 Discoid lupus erythematosus: Secondary | ICD-10-CM | POA: Diagnosis not present

## 2020-05-23 DIAGNOSIS — D0439 Carcinoma in situ of skin of other parts of face: Secondary | ICD-10-CM | POA: Diagnosis not present

## 2020-09-04 ENCOUNTER — Ambulatory Visit (INDEPENDENT_AMBULATORY_CARE_PROVIDER_SITE_OTHER): Payer: Medicare Other | Admitting: Internal Medicine

## 2020-09-04 ENCOUNTER — Other Ambulatory Visit: Payer: Self-pay

## 2020-09-04 ENCOUNTER — Encounter: Payer: Self-pay | Admitting: Internal Medicine

## 2020-09-04 ENCOUNTER — Ambulatory Visit (INDEPENDENT_AMBULATORY_CARE_PROVIDER_SITE_OTHER): Payer: Medicare Other

## 2020-09-04 VITALS — BP 151/82 | HR 57 | Temp 98.1°F | Ht 61.0 in | Wt 129.1 lb

## 2020-09-04 DIAGNOSIS — M79605 Pain in left leg: Secondary | ICD-10-CM | POA: Diagnosis not present

## 2020-09-04 DIAGNOSIS — I739 Peripheral vascular disease, unspecified: Secondary | ICD-10-CM

## 2020-09-04 DIAGNOSIS — I700 Atherosclerosis of aorta: Secondary | ICD-10-CM | POA: Diagnosis not present

## 2020-09-04 DIAGNOSIS — E785 Hyperlipidemia, unspecified: Secondary | ICD-10-CM | POA: Diagnosis not present

## 2020-09-04 DIAGNOSIS — F411 Generalized anxiety disorder: Secondary | ICD-10-CM

## 2020-09-04 DIAGNOSIS — M79662 Pain in left lower leg: Secondary | ICD-10-CM | POA: Diagnosis not present

## 2020-09-04 DIAGNOSIS — R911 Solitary pulmonary nodule: Secondary | ICD-10-CM | POA: Diagnosis not present

## 2020-09-04 NOTE — Assessment & Plan Note (Signed)
Repeat CT in Oct 2022

## 2020-09-04 NOTE — Assessment & Plan Note (Signed)
Stable Cont w/Paxil

## 2020-09-04 NOTE — Assessment & Plan Note (Signed)
Chronic of ?etiology Obtain tib/fib X ray

## 2020-09-04 NOTE — Assessment & Plan Note (Signed)
Take Lipitor 10 mg qod due to LE pain, weakness Will get lipids

## 2020-09-04 NOTE — Progress Notes (Signed)
Subjective:  Patient ID: Whitney Benson, female    DOB: [birth year]  Age: 86 y.o. MRN: [id number]  CC: Follow-up (6 month f/u)   HPI Whitney Benson presents for LUL nodule, dyslipidemia C/o fatigue, achy legs. C/o LLE pain x years She is here w/dtr Gerald Stabs  Outpatient Medications Prior to Visit  Medication Sig Dispense Refill   alendronate (FOSAMAX) 70 MG tablet Take 70 mg by mouth once a week.     aspirin 81 MG tablet Take 81 mg by mouth daily.     atorvastatin (LIPITOR) 20 MG tablet TAKE 1 TABLET BY MOUTH DAILY 90 tablet 2   cholecalciferol (VITAMIN D) 1000 units tablet Take 1,000 Units by mouth daily.     diazepam (VALIUM) 5 MG tablet TAKE 1 TABLET BY MOUTH EVERY 6 HOURS AS NEEDED FOR ANXIETY DO NOT TAKE WITH ZOLPIDEM 30 tablet 3   diclofenac Sodium (VOLTAREN) 1 % GEL Apply 1 application topically 4 (four) times daily. 100 g 3   glucosamine-chondroitin 500-400 MG tablet Take 1 tablet by mouth daily.     metoprolol succinate (TOPROL-XL) 50 MG 24 hr tablet TAKE 1 TABLET BY MOUTH DAILY WITH A MEAL 90 tablet 2   MULTIPLE VITAMIN PO Take by mouth.     nitroGLYCERIN (NITROSTAT) 0.4 MG SL tablet Place 1 tablet (0.4 mg total) under the tongue every 5 (five) minutes as needed for chest pain. 20 tablet 1   pantoprazole (PROTONIX) 40 MG tablet Take 1 tablet (40 mg total) by mouth daily. 90 tablet 3   PARoxetine (PAXIL) 10 MG tablet TAKE 1 TABLET BY MOUTH DAILY 90 tablet 1   polyethylene glycol powder (GLYCOLAX/MIRALAX) 17 GM/SCOOP powder Take 17 g by mouth 2 (two) times daily as needed for mild constipation or moderate constipation. 850 g 3   triamcinolone ointment (KENALOG) 0.1 % Apply 1 application topically 2 (two) times daily. 80 g 2   valsartan-hydrochlorothiazide (DIOVAN HCT) 160-12.5 MG tablet Take 1 tablet by mouth daily. 90 tablet 3   zolpidem (AMBIEN) 5 MG tablet TAKE 1 TABLET BY MOUTH DAILY AT BEDTIME AS NEEDED FOR SLEEP 90 tablet 1   No facility-administered medications prior to  visit.    ROS: Review of Systems  Constitutional:  Negative for activity change, appetite change, chills, fatigue and unexpected weight change.  HENT:  Negative for congestion, mouth sores and sinus pressure.   Eyes:  Negative for visual disturbance.  Respiratory:  Negative for cough and chest tightness.   Gastrointestinal:  Negative for abdominal pain and nausea.  Genitourinary:  Negative for difficulty urinating, frequency and vaginal pain.  Musculoskeletal:  Positive for arthralgias and gait problem. Negative for back pain.  Skin:  Negative for pallor and rash.  Neurological:  Positive for weakness. Negative for dizziness, tremors, numbness and headaches.  Psychiatric/Behavioral:  Negative for confusion, sleep disturbance and suicidal ideas. The patient is not nervous/anxious.    Objective:  BP (!) 151/82 (BP Location: Left Arm)   Pulse (!) 57   Temp 98.1 F (36.7 C) (Oral)   Ht 5\' 1"  (1.549 m)   Wt 129 lb 1.9 oz (58.6 kg)   SpO2 96%   BMI 24.40 kg/m   BP Readings from Last 3 Encounters:  09/04/20 (!) 151/82  03/07/20 132/84  09/06/19 130/78    Wt Readings from Last 3 Encounters:  09/04/20 129 lb 1.9 oz (58.6 kg)  03/07/20 127 lb (57.6 kg)  09/06/19 130 lb (59 kg)    Physical Exam Constitutional:  General: She is not in acute distress.    Appearance: She is well-developed.  HENT:     Head: Normocephalic.     Right Ear: External ear normal.     Left Ear: External ear normal.     Nose: Nose normal.  Eyes:     General:        Right eye: No discharge.        Left eye: No discharge.     Conjunctiva/sclera: Conjunctivae normal.     Pupils: Pupils are equal, round, and reactive to light.  Neck:     Thyroid: No thyromegaly.     Vascular: No JVD.     Trachea: No tracheal deviation.  Cardiovascular:     Rate and Rhythm: Normal rate and regular rhythm.     Heart sounds: Normal heart sounds.  Pulmonary:     Effort: No respiratory distress.     Breath sounds:  No stridor. No wheezing.  Abdominal:     General: Bowel sounds are normal. There is no distension.     Palpations: Abdomen is soft. There is no mass.     Tenderness: There is no abdominal tenderness. There is no guarding or rebound.  Musculoskeletal:        General: No tenderness.     Cervical back: Normal range of motion and neck supple. No rigidity.  Lymphadenopathy:     Cervical: No cervical adenopathy.  Skin:    Findings: No erythema or rash.  Neurological:     Mental Status: She is oriented to person, place, and time.     Cranial Nerves: No cranial nerve deficit.     Motor: No abnormal muscle tone.     Coordination: Coordination normal.     Deep Tendon Reflexes: Reflexes normal.  Psychiatric:        Behavior: Behavior normal.        Thought Content: Thought content normal.        Judgment: Judgment normal.    Lab Results  Component Value Date   WBC 6.1 11/01/2019   HGB 13.0 11/01/2019   HCT 38.0 11/01/2019   PLT 197.0 11/01/2019   GLUCOSE 95 11/01/2019   CHOL 153 11/01/2019   TRIG 115.0 11/01/2019   HDL 63.00 11/01/2019   LDLDIRECT 84.1 07/20/2012   LDLCALC 67 11/01/2019   ALT 15 11/01/2019   AST 18 11/01/2019   NA 133 (L) 11/01/2019   K 3.7 11/01/2019   CL 97 11/01/2019   CREATININE 0.78 11/01/2019   BUN 19 11/01/2019   CO2 27 11/01/2019   TSH 1.65 11/01/2019    CT Chest Wo Contrast  Result Date: 12/15/2018 CLINICAL DATA:  85 year old female with history of pneumonia. EXAM: CT CHEST WITHOUT CONTRAST TECHNIQUE: Multidetector CT imaging of the chest was performed following the standard protocol without IV contrast. COMPARISON:  No priors. FINDINGS: Cardiovascular: Heart size is normal. There is no significant pericardial fluid, thickening or pericardial calcification. Aortic atherosclerosis. No coronary artery calcifications. Mediastinum/Nodes: No pathologically enlarged mediastinal or hilar lymph nodes. Please note that accurate exclusion of hilar adenopathy is  limited on noncontrast CT scans. Multiple densely calcified mediastinal and left hilar lymph nodes. Esophagus is unremarkable in appearance. No axillary lymphadenopathy. Lungs/Pleura: 9 mm ground-glass attenuation nodule in the right upper lobe abutting the minor fissure (axial image 63 of series 3). Calcified granuloma in the superior segment of the left lower lobe. No other suspicious appearing pulmonary nodules or masses are noted. No acute consolidative airspace disease. No  pleural effusions. Upper Abdomen: Aortic atherosclerosis. Musculoskeletal: There are no aggressive appearing lytic or blastic lesions noted in the visualized portions of the skeleton. IMPRESSION: 1. No acute findings in the thorax. 2. 9 mm ground-glass attenuation nodule in the inferior aspect of the right upper lobe, nonspecific. Initial follow-up with CT at 6-12 months is recommended to confirm persistence. If persistent, repeat CT is recommended every 2 years until 5 years of stability has been established. This recommendation follows the consensus statement: Guidelines for Management of Incidental Pulmonary Nodules Detected on CT Images: From the Fleischner Society 2017; Radiology 2017; 284:228-243. 3. Aortic atherosclerosis. Aortic Atherosclerosis (ICD10-I70.0). Electronically Signed   By: Vinnie Langton M.D.   On: 12/15/2018 16:38    Assessment & Plan:    Walker Kehr, MD

## 2020-09-04 NOTE — Assessment & Plan Note (Signed)
On Lipitor 

## 2020-09-04 NOTE — Assessment & Plan Note (Signed)
Decrease Lipitor due to leg weakness - she is on 10 mg a day - reduce to 10 mg qod

## 2020-09-05 LAB — URINALYSIS
Bilirubin Urine: NEGATIVE
Ketones, ur: NEGATIVE
Leukocytes,Ua: NEGATIVE
Nitrite: NEGATIVE
Specific Gravity, Urine: 1.015 (ref 1.000–1.030)
Total Protein, Urine: NEGATIVE
Urine Glucose: NEGATIVE
Urobilinogen, UA: 0.2 (ref 0.0–1.0)
pH: 6.5 (ref 5.0–8.0)

## 2020-09-05 LAB — CBC WITH DIFFERENTIAL/PLATELET
Basophils Absolute: 0.1 10*3/uL (ref 0.0–0.1)
Basophils Relative: 0.9 % (ref 0.0–3.0)
Eosinophils Absolute: 0.2 10*3/uL (ref 0.0–0.7)
Eosinophils Relative: 2.6 % (ref 0.0–5.0)
HCT: 37.3 % (ref 36.0–46.0)
Hemoglobin: 12.8 g/dL (ref 12.0–15.0)
Lymphocytes Relative: 34.4 % (ref 12.0–46.0)
Lymphs Abs: 2.5 10*3/uL (ref 0.7–4.0)
MCHC: 34.3 g/dL (ref 30.0–36.0)
MCV: 93.9 fl (ref 78.0–100.0)
Monocytes Absolute: 0.8 10*3/uL (ref 0.1–1.0)
Monocytes Relative: 10.6 % (ref 3.0–12.0)
Neutro Abs: 3.7 10*3/uL (ref 1.4–7.7)
Neutrophils Relative %: 51.5 % (ref 43.0–77.0)
Platelets: 229 10*3/uL (ref 150.0–400.0)
RBC: 3.98 Mil/uL (ref 3.87–5.11)
RDW: 12.5 % (ref 11.5–15.5)
WBC: 7.2 10*3/uL (ref 4.0–10.5)

## 2020-09-05 LAB — COMPREHENSIVE METABOLIC PANEL
ALT: 16 U/L (ref 0–35)
AST: 21 U/L (ref 0–37)
Albumin: 4.3 g/dL (ref 3.5–5.2)
Alkaline Phosphatase: 35 U/L — ABNORMAL LOW (ref 39–117)
BUN: 21 mg/dL (ref 6–23)
CO2: 30 mEq/L (ref 19–32)
Calcium: 9.7 mg/dL (ref 8.4–10.5)
Chloride: 99 mEq/L (ref 96–112)
Creatinine, Ser: 0.94 mg/dL (ref 0.40–1.20)
GFR: 55.82 mL/min — ABNORMAL LOW (ref >60.00)
Glucose, Bld: 92 mg/dL (ref 70–99)
Potassium: 4.4 mEq/L (ref 3.5–5.1)
Sodium: 136 mEq/L (ref 135–145)
Total Bilirubin: 0.6 mg/dL (ref 0.2–1.2)
Total Protein: 6.8 g/dL (ref 6.0–8.3)

## 2020-09-05 LAB — LIPID PANEL
Cholesterol: 159 mg/dL (ref 0–200)
HDL: 59.5 mg/dL (ref >39.00)
LDL Cholesterol: 62 mg/dL (ref 0–99)
NonHDL: 99.88
Total CHOL/HDL Ratio: 3
Triglycerides: 189 mg/dL — ABNORMAL HIGH (ref 0.0–149.0)
VLDL: 37.8 mg/dL (ref 0.0–40.0)

## 2020-09-05 LAB — TSH: TSH: 1.75 u[IU]/mL (ref 0.35–5.50)

## 2020-09-15 ENCOUNTER — Other Ambulatory Visit: Payer: Self-pay

## 2020-09-15 ENCOUNTER — Ambulatory Visit (INDEPENDENT_AMBULATORY_CARE_PROVIDER_SITE_OTHER)
Admission: RE | Admit: 2020-09-15 | Discharge: 2020-09-15 | Disposition: A | Discharge status: Home / Self Care | Payer: Medicare Other | Source: Ambulatory Visit | Attending: Internal Medicine | Admitting: Internal Medicine

## 2020-09-15 DIAGNOSIS — R911 Solitary pulmonary nodule: Secondary | ICD-10-CM

## 2020-09-15 DIAGNOSIS — I700 Atherosclerosis of aorta: Secondary | ICD-10-CM | POA: Diagnosis not present

## 2020-09-15 DIAGNOSIS — R918 Other nonspecific abnormal finding of lung field: Secondary | ICD-10-CM | POA: Diagnosis not present

## 2020-09-26 ENCOUNTER — Ambulatory Visit (INDEPENDENT_AMBULATORY_CARE_PROVIDER_SITE_OTHER): Payer: Medicare Other

## 2020-09-26 ENCOUNTER — Other Ambulatory Visit: Payer: Self-pay

## 2020-09-26 VITALS — BP 140/60 | HR 63 | Temp 97.7°F | Resp 16 | Ht 61.0 in | Wt 130.0 lb

## 2020-09-26 DIAGNOSIS — Z0000 Encounter for general adult medical examination without abnormal findings: Secondary | ICD-10-CM | POA: Diagnosis not present

## 2020-09-26 NOTE — Patient Instructions (Signed)
Whitney Benson , Thank you for taking time to come for your Medicare Wellness Visit. I appreciate your ongoing commitment to your health goals. Please review the following plan we discussed and let me know if I can assist you in the future.   Screening recommendations/referrals: Colonoscopy: not a candidate for screening due to age 86: discontinued Bone Density: last done 11/25/2011 Recommended yearly ophthalmology/optometry visit for glaucoma screening and checkup Recommended yearly dental visit for hygiene and checkup  Vaccinations: Influenza vaccine: 11/15/2019 Pneumococcal vaccine: 12/20/2007, 10/29/2012 Tdap vaccine: 08/08/2017; due every 10 years Shingles vaccine: Please call your insurance company to determine your out of pocket expense for the Shingrix vaccine. You may receive this vaccine at your local pharmacy.   Covid-19: 04/02/2019, 04/30/2019, 12/14/2019, 05/29/2020  Advanced directives: Please bring a copy of your health care power of attorney and living will to the office at your convenience.  Conditions/risks identified: Yes; Client understands the importance of follow-up with providers by attending scheduled visits and discussed goals to eat healthier, increase physical activity, exercise the brain, socialize more, get enough sleep and make time for laughter.  Next appointment: Please schedule your next Medicare Wellness Visit with your Nurse Health Advisor in 1 year by calling [phone].   Preventive Care 32 Years and Older, Female Preventive care refers to lifestyle choices and visits with your health care provider that can promote health and wellness. What does preventive care include? A yearly physical exam. This is also called an annual well check. Dental exams once or twice a year. Routine eye exams. Ask your health care provider how often you should have your eyes checked. Personal lifestyle choices, including: Daily care of your teeth and gums. Regular physical  activity. Eating a healthy diet. Avoiding tobacco and drug use. Limiting alcohol use. Practicing safe sex. Taking low-dose aspirin every day. Taking vitamin and mineral supplements as recommended by your health care provider. What happens during an annual well check? The services and screenings done by your health care provider during your annual well check will depend on your age, overall health, lifestyle risk factors, and family history of disease. Counseling  Your health care provider may ask you questions about your: Alcohol use. Tobacco use. Drug use. Emotional well-being. Home and relationship well-being. Sexual activity. Eating habits. History of falls. Memory and ability to understand (cognition). Work and work Statistician. Reproductive health. Screening  You may have the following tests or measurements: Height, weight, and BMI. Blood pressure. Lipid and cholesterol levels. These may be checked every 5 years, or more frequently if you are over 29 years old. Skin check. Lung cancer screening. You may have this screening every year starting at age 88 if you have a 30-pack-year history of smoking and currently smoke or have quit within the past 15 years. Fecal occult blood test (FOBT) of the stool. You may have this test every year starting at age 73. Flexible sigmoidoscopy or colonoscopy. You may have a sigmoidoscopy every 5 years or a colonoscopy every 10 years starting at age 69. Hepatitis C blood test. Hepatitis B blood test. Sexually transmitted disease (STD) testing. Diabetes screening. This is done by checking your blood sugar (glucose) after you have not eaten for a while (fasting). You may have this done every 1-3 years. Bone density scan. This is done to screen for osteoporosis. You may have this done starting at age 30. Mammogram. This may be done every 1-2 years. Talk to your health care provider about how often you should have  regular mammograms. Talk with your  health care provider about your test results, treatment options, and if necessary, the need for more tests. Vaccines  Your health care provider may recommend certain vaccines, such as: Influenza vaccine. This is recommended every year. Tetanus, diphtheria, and acellular pertussis (Tdap, Td) vaccine. You may need a Td booster every 10 years. Zoster vaccine. You may need this after age 40. Pneumococcal 13-valent conjugate (PCV13) vaccine. One dose is recommended after age 72. Pneumococcal polysaccharide (PPSV23) vaccine. One dose is recommended after age 20. Talk to your health care provider about which screenings and vaccines you need and how often you need them. This information is not intended to replace advice given to you by your health care provider. Make sure you discuss any questions you have with your health care provider. Document Released: 03/03/2015 Document Revised: 10/25/2015 Document Reviewed: 12/06/2014 Elsevier Interactive Patient Education  2017 [place] Prevention in the Home Falls can cause injuries. They can happen to people of all ages. There are many things you can do to make your home safe and to help prevent falls. What can I do on the outside of my home? Regularly fix the edges of walkways and driveways and fix any cracks. Remove anything that might make you trip as you walk through a door, such as a raised step or threshold. Trim any bushes or trees on the path to your home. Use bright outdoor lighting. Clear any walking paths of anything that might make someone trip, such as rocks or tools. Regularly check to see if handrails are loose or broken. Make sure that both sides of any steps have handrails. Any raised decks and porches should have guardrails on the edges. Have any leaves, snow, or ice cleared regularly. Use sand or salt on walking paths during winter. Clean up any spills in your garage right away. This includes oil or grease spills. What can I  do in the bathroom? Use night lights. Install grab bars by the toilet and in the tub and shower. Do not use towel bars as grab bars. Use non-skid mats or decals in the tub or shower. If you need to sit down in the shower, use a plastic, non-slip stool. Keep the floor dry. Clean up any water that spills on the floor as soon as it happens. Remove soap buildup in the tub or shower regularly. Attach bath mats securely with double-sided non-slip rug tape. Do not have throw rugs and other things on the floor that can make you trip. What can I do in the bedroom? Use night lights. Make sure that you have a light by your bed that is easy to reach. Do not use any sheets or blankets that are too big for your bed. They should not hang down onto the floor. Have a firm chair that has side arms. You can use this for support while you get dressed. Do not have throw rugs and other things on the floor that can make you trip. What can I do in the kitchen? Clean up any spills right away. Avoid walking on wet floors. Keep items that you use a lot in easy-to-reach places. If you need to reach something above you, use a strong step stool that has a grab bar. Keep electrical cords out of the way. Do not use floor polish or wax that makes floors slippery. If you must use wax, use non-skid floor wax. Do not have throw rugs and other things on the floor that  can make you trip. What can I do with my stairs? Do not leave any items on the stairs. Make sure that there are handrails on both sides of the stairs and use them. Fix handrails that are broken or loose. Make sure that handrails are as long as the stairways. Check any carpeting to make sure that it is firmly attached to the stairs. Fix any carpet that is loose or worn. Avoid having throw rugs at the top or bottom of the stairs. If you do have throw rugs, attach them to the floor with carpet tape. Make sure that you have a light switch at the top of the stairs  and the bottom of the stairs. If you do not have them, ask someone to add them for you. What else can I do to help prevent falls? Wear shoes that: Do not have high heels. Have rubber bottoms. Are comfortable and fit you well. Are closed at the toe. Do not wear sandals. If you use a stepladder: Make sure that it is fully opened. Do not climb a closed stepladder. Make sure that both sides of the stepladder are locked into place. Ask someone to hold it for you, if possible. Clearly mark and make sure that you can see: Any grab bars or handrails. First and last steps. Where the edge of each step is. Use tools that help you move around (mobility aids) if they are needed. These include: Canes. Walkers. Scooters. Crutches. Turn on the lights when you go into a dark area. Replace any light bulbs as soon as they burn out. Set up your furniture so you have a clear path. Avoid moving your furniture around. If any of your floors are uneven, fix them. If there are any pets around you, be aware of where they are. Review your medicines with your doctor. Some medicines can make you feel dizzy. This can increase your chance of falling. Ask your doctor what other things that you can do to help prevent falls. This information is not intended to replace advice given to you by your health care provider. Make sure you discuss any questions you have with your health care provider. Document Released: 12/01/2008 Document Revised: 07/13/2015 Document Reviewed: 03/11/2014 Elsevier Interactive Patient Education  2017 [employer].

## 2020-09-26 NOTE — Progress Notes (Addendum)
Subjective:   Whitney Benson is a 86 y.o. female who presents for Medicare Annual (Subsequent) preventive examination.  Review of Systems     Cardiac Risk Factors include: advanced age (>93mn, >>80women);dyslipidemia;family history of premature cardiovascular disease;hypertension     Objective:    Today's Vitals   09/26/20 1322  BP: 140/60  Pulse: 63  Resp: 16  Temp: 97.7 F (36.5 C)  SpO2: 95%  Weight: 130 lb (59 kg)  Height: '5\' 1"'$  (1.549 m)  PainSc: 0-No pain   Body mass index is 24.56 kg/m.  Advanced Directives 09/26/2020 12/01/2017  Does Patient Have a Medical Advance Directive? Yes No  Type of Advance Directive Living will;Healthcare Power of Attorney -  Does patient want to make changes to medical advance directive? No - Patient declined -  Copy of [place] Chart? No - copy requested -  Would patient like information on creating a medical advance directive? - Yes (ED - Information included in AVS)    Current Medications (verified) Outpatient Encounter Medications as of 09/26/2020  Medication Sig   alendronate (FOSAMAX) 70 MG tablet Take 70 mg by mouth once a week.   aspirin 81 MG tablet Take 81 mg by mouth daily.   atorvastatin (LIPITOR) 20 MG tablet TAKE 1 TABLET BY MOUTH DAILY (Patient taking differently: Take 20 mg by mouth every other day.)   cholecalciferol (VITAMIN D) 1000 units tablet Take 1,000 Units by mouth daily.   diazepam (VALIUM) 5 MG tablet TAKE 1 TABLET BY MOUTH EVERY 6 HOURS AS NEEDED FOR ANXIETY DO NOT TAKE WITH ZOLPIDEM   glucosamine-chondroitin 500-400 MG tablet Take 1 tablet by mouth daily.   metoprolol succinate (TOPROL-XL) 50 MG 24 hr tablet TAKE 1 TABLET BY MOUTH DAILY WITH A MEAL   MULTIPLE VITAMIN PO Take by mouth.   pantoprazole (PROTONIX) 40 MG tablet Take 1 tablet (40 mg total) by mouth daily.   PARoxetine (PAXIL) 10 MG tablet TAKE 1 TABLET BY MOUTH DAILY   polyethylene glycol powder (GLYCOLAX/MIRALAX) 17 GM/SCOOP  powder Take 17 g by mouth 2 (two) times daily as needed for mild constipation or moderate constipation.   valsartan-hydrochlorothiazide (DIOVAN HCT) 160-12.5 MG tablet Take 1 tablet by mouth daily.   zolpidem (AMBIEN) 5 MG tablet TAKE 1 TABLET BY MOUTH DAILY AT BEDTIME AS NEEDED FOR SLEEP   diclofenac Sodium (VOLTAREN) 1 % GEL Apply 1 application topically 4 (four) times daily. (Patient not taking: Reported on 09/26/2020)   nitroGLYCERIN (NITROSTAT) 0.4 MG SL tablet Place 1 tablet (0.4 mg total) under the tongue every 5 (five) minutes as needed for chest pain. (Patient not taking: Reported on 09/26/2020)   triamcinolone ointment (KENALOG) 0.1 % Apply 1 application topically 2 (two) times daily. (Patient not taking: Reported on 09/26/2020)   No facility-administered encounter medications on file as of 09/26/2020.    Allergies (verified) Hydrocodone   History: Past Medical History:  Diagnosis Date   DJD (degenerative joint disease)    History of asthma    History of colon polyps    Hyperlipidemia    Hypertension    Insomnia    Past Surgical History:  Procedure Laterality Date   DILATION AND CURETTAGE OF UTERUS     OTHER SURGICAL HISTORY  09/2011   mammogram and bone density   POLYPECTOMY     TUBAL LIGATION     Family History  Problem Relation Age of Onset   Cancer Mother    Hypertension Mother  Cancer Father    Social History   Socioeconomic History   Marital status: Widowed    Spouse name: Not on file   Number of children: Not on file   Years of education: Not on file   Highest education level: Not on file  Occupational History   Not on file  Tobacco Use   Smoking status: Former   Smokeless tobacco: Never  Vaping Use   Vaping Use: Never used  Substance and Sexual Activity   Alcohol use: Yes    Comment: wine   Drug use: No   Sexual activity: Not on file  Other Topics Concern   Not on file  Social History Narrative   HSG   Married '61   2 sons- '69, '78, 2  daughters- '63, '65, 4 grandchildren   Worked- Magazine features editor, Medical sales representative work, Agricultural engineer   Social Determinants of Radio broadcast assistant Strain: Low Risk    Difficulty of Paying Living Expenses: Not hard at [employer] Insecurity: No Food Insecurity   Worried About Charity fundraiser in the Last Year: Never true   Arboriculturist in the Last Year: Never true  Transportation Needs: No Transportation Needs   Lack of Transportation (Medical): No   Lack of Transportation (Non-Medical): No  Physical Activity: Sufficiently Active   Days of Exercise per Week: 5 days   Minutes of Exercise per Session: 30 min  Stress: No Stress Concern Present   Feeling of Stress : Not at all  Social Connections: Socially Isolated   Frequency of Communication with Friends and Family: More than three times a week   Frequency of Social Gatherings with Friends and Family: More than three times a week   Attends Religious Services: Never   Marine scientist or Organizations: No   Attends Archivist Meetings: Never   Marital Status: Widowed    Tobacco Counseling Counseling given: Not Answered   Clinical Intake:  Pre-visit preparation completed: Yes  Pain : No/denies pain Pain Score: 0-No pain     BMI - recorded: 24.56 Nutritional Status: BMI of 19-24  Normal Nutritional Risks: None Diabetes: No  How often do you need to have someone help you when you read instructions, pamphlets, or other written materials from your doctor or pharmacy?: 1 - Never What is the last grade level you completed in school?: High School Graduate  Diabetic? no  Interpreter Needed?: No  Information entered by :: Lisette Abu, LPN   Activities of Daily Living In your present state of health, do you have any difficulty performing the following activities: 09/26/2020  Hearing? N  Vision? N  Difficulty concentrating or making decisions? N  Walking or climbing stairs? N  Dressing or bathing? N  Doing  errands, shopping? N  Preparing Food and eating ? N  Using the Toilet? N  In the past six months, have you accidently leaked urine? N  Do you have problems with loss of bowel control? N  Managing your Medications? N  Managing your Finances? N  Housekeeping or managing your Housekeeping? N  Some recent data might be hidden    Patient Care Team: Plotnikov, Evie Lacks, MD as PCP - General (Internal Medicine) Arvella Nigh, MD as Consulting Physician (Obstetrics and Gynecology) Rutherford Guys, MD as Consulting Physician (Ophthalmology)  Indicate any recent Medical Services you may have received from other than [facility] providers in the past year (date may be approximate).     Assessment:  This is a routine wellness examination for [place].  Hearing/Vision screen Hearing Screening - Comments:: Patient denied any hearing difficulty. Vision Screening - Comments:: Patient wears glasses.  Eye exam done annually by Dr. Rutherford Guys.  Dietary issues and exercise activities discussed: Current Exercise Habits: Home exercise routine, Type of exercise: walking;Other - see comments (yard work, house work, Medical laboratory scientific officer; very busy and active), Time (Minutes): 30, Frequency (Times/Week): 5, Weekly Exercise (Minutes/Week): 150, Exercise limited by: None identified   Goals Addressed               This Visit's Progress     Patient Stated (pt-stated)        To maintain my current health status by continuing to eat healthy, stay physically active and socially active.      Depression Screen PHQ 2/9 Scores 09/26/2020 09/04/2020 09/06/2019 10/29/2018 10/27/2017 05/21/2016 01/03/2015  PHQ - 2 Score 0 0 0 0 0 0 0  PHQ- 9 Score - 1 0 - - - -    Fall Risk Fall Risk  09/26/2020 09/04/2020 10/29/2018 10/27/2017 05/21/2016  Falls in the past year? 0 0 0 No No  Number falls in past yr: 0 0 - - -  Injury with Fall? 0 0 - - -  Risk for fall due to : No Fall Risks No Fall Risks - - -  Follow up Falls evaluation completed - Falls  evaluation completed - -    FALL RISK PREVENTION PERTAINING TO THE HOME:  Any stairs in or around the home? Yes  If so, are there any without handrails? No  Home free of loose throw rugs in walkways, pet beds, electrical cords, etc? Yes  Adequate lighting in your home to reduce risk of falls? Yes   ASSISTIVE DEVICES UTILIZED TO PREVENT FALLS:  Life alert? No  Use of a cane, walker or w/c? No  Grab bars in the bathroom? Yes  Shower chair or bench in shower? Yes  Elevated toilet seat or a handicapped toilet? No   TIMED UP AND GO:  Was the test performed? Yes .  Length of time to ambulate 10 feet: 6 sec.   Gait steady and fast without use of assistive device  Cognitive Function: Normal cognitive status assessed by direct observation by this Nurse Health Advisor. No abnormalities found.          Immunizations Immunization History  Administered Date(s) Administered   Fluad Quad(high Dose 65+) 10/29/2018, 11/15/2019   H1N1 02/01/2008   Influenza Split 10/29/2011   Influenza Whole 12/08/2006, 11/15/2008, 10/25/2009   Influenza, High Dose Seasonal PF 11/09/2014, 12/09/2016, 10/27/2017   Influenza,inj,Quad PF,6+ Mos 10/29/2012, 11/04/2013   Influenza-Unspecified 11/25/2015   Moderna Sars-Covid-2 Vaccination 04/02/2019, 04/30/2019, 12/14/2019, 05/29/2020   Pneumococcal Conjugate-13 10/29/2012   Pneumococcal Polysaccharide-23 12/20/1998, 12/20/2007   Tdap 08/08/2017   Zoster, Live 04/17/2013    TDAP status: Up to date  Flu Vaccine status: Up to date  Pneumococcal vaccine status: Up to date  Covid-19 vaccine status: Completed vaccines  Qualifies for Shingles Vaccine? Yes   Zostavax completed Yes   Shingrix Completed?: No.    Education has been provided regarding the importance of this vaccine. Patient has been advised to call insurance company to determine out of pocket expense if they have not yet received this vaccine. Advised may also receive vaccine at local pharmacy  or Health Dept. Verbalized acceptance and understanding.  Screening Tests Health Maintenance  Topic Date Due   Zoster Vaccines- Shingrix (1 of 2) Never  done   INFLUENZA VACCINE  09/18/2020   COVID-19 Vaccine (5 - Booster for Moderna series) 09/28/2020   TETANUS/TDAP  08/09/2027   DEXA SCAN  Completed   PNA vac Low Risk Adult  Completed   HPV VACCINES  Aged Out   MAMMOGRAM  Discontinued    Health Maintenance  Health Maintenance Due  Topic Date Due   Zoster Vaccines- Shingrix (1 of 2) Never done   INFLUENZA VACCINE  09/18/2020   COVID-19 Vaccine (5 - Booster for Moderna series) 09/28/2020    Colorectal cancer screening: No longer required.   Mammogram status: No longer required due to order being discontinued.  Bone Density status: Completed 11/25/2011. Results reflect: Bone density results: OSTEOPENIA. Repeat every 2 years.  Lung Cancer Screening: (Low Dose CT Chest recommended if Age 17-80 years, 30 pack-year currently smoking OR have quit w/in 15years.) does not qualify.   Lung Cancer Screening Referral: no  Additional Screening:  Hepatitis C Screening: does not qualify; Completed no  Vision Screening: Recommended annual ophthalmology exams for early detection of glaucoma and other disorders of the eye. Is the patient up to date with their annual eye exam?  Yes  Who is the provider or what is the name of the office in which the patient attends annual eye exams? Rutherford Guys, MD. If pt is not established with a provider, would they like to be referred to a provider to establish care? No .   Dental Screening: Recommended annual dental exams for proper oral hygiene  Community Resource Referral / Chronic Care Management: CRR required this visit?  No   CCM required this visit?  No      Plan:     I have personally reviewed and noted the following in the patient's chart:   Medical and social history Use of alcohol, tobacco or illicit drugs  Current medications and  supplements including opioid prescriptions.  Functional ability and status Nutritional status Physical activity Advanced directives List of other physicians Hospitalizations, surgeries, and ER visits in previous 12 months Vitals Screenings to include cognitive, depression, and falls Referrals and appointments  In addition, I have reviewed and discussed with patient certain preventive protocols, quality metrics, and best practice recommendations. A written personalized care plan for preventive services as well as general preventive health recommendations were provided to patient.     Sheral Flow, LPN   [id number]   Nurse Notes: n/a   Medical screening examination/treatment/procedure(s) were performed by non-physician practitioner and as supervising physician I was immediately available for consultation/collaboration.  I agree with above. Lew Dawes, MD

## 2020-10-30 DIAGNOSIS — Z961 Presence of intraocular lens: Secondary | ICD-10-CM | POA: Diagnosis not present

## 2020-10-30 DIAGNOSIS — H524 Presbyopia: Secondary | ICD-10-CM | POA: Diagnosis not present

## 2020-10-30 DIAGNOSIS — H52203 Unspecified astigmatism, bilateral: Secondary | ICD-10-CM | POA: Diagnosis not present

## 2020-11-13 ENCOUNTER — Other Ambulatory Visit: Payer: Self-pay | Admitting: Internal Medicine

## 2020-11-14 ENCOUNTER — Other Ambulatory Visit: Payer: Self-pay | Admitting: Internal Medicine

## 2020-12-11 ENCOUNTER — Other Ambulatory Visit: Payer: Self-pay | Admitting: Internal Medicine

## 2021-01-18 ENCOUNTER — Other Ambulatory Visit (INDEPENDENT_AMBULATORY_CARE_PROVIDER_SITE_OTHER): Payer: Medicare Other

## 2021-01-18 ENCOUNTER — Other Ambulatory Visit: Payer: Self-pay

## 2021-01-18 DIAGNOSIS — E785 Hyperlipidemia, unspecified: Secondary | ICD-10-CM | POA: Diagnosis not present

## 2021-01-18 DIAGNOSIS — R21 Rash and other nonspecific skin eruption: Secondary | ICD-10-CM

## 2021-01-18 DIAGNOSIS — I10 Essential (primary) hypertension: Secondary | ICD-10-CM | POA: Diagnosis not present

## 2021-01-18 LAB — CBC WITH DIFFERENTIAL/PLATELET
Basophils Absolute: 0 10*3/uL (ref 0.0–0.1)
Basophils Relative: 0.6 % (ref 0.0–3.0)
Eosinophils Absolute: 0.1 10*3/uL (ref 0.0–0.7)
Eosinophils Relative: 2.7 % (ref 0.0–5.0)
HCT: 38.4 % (ref 36.0–46.0)
Hemoglobin: 12.8 g/dL (ref 12.0–15.0)
Lymphocytes Relative: 38.4 % (ref 12.0–46.0)
Lymphs Abs: 1.7 10*3/uL (ref 0.7–4.0)
MCHC: 33.4 g/dL (ref 30.0–36.0)
MCV: 95.7 fl (ref 78.0–100.0)
Monocytes Absolute: 0.4 10*3/uL (ref 0.1–1.0)
Monocytes Relative: 9.8 % (ref 3.0–12.0)
Neutro Abs: 2.2 10*3/uL (ref 1.4–7.7)
Neutrophils Relative %: 48.5 % (ref 43.0–77.0)
Platelets: 217 10*3/uL (ref 150.0–400.0)
RBC: 4.01 Mil/uL (ref 3.87–5.11)
RDW: 12.5 % (ref 11.5–15.5)
WBC: 4.5 10*3/uL (ref 4.0–10.5)

## 2021-01-18 LAB — COMPREHENSIVE METABOLIC PANEL
ALT: 12 U/L (ref 0–35)
AST: 19 U/L (ref 0–37)
Albumin: 4 g/dL (ref 3.5–5.2)
Alkaline Phosphatase: 38 U/L — ABNORMAL LOW (ref 39–117)
BUN: 13 mg/dL (ref 6–23)
CO2: 29 mEq/L (ref 19–32)
Calcium: 9.3 mg/dL (ref 8.4–10.5)
Chloride: 101 mEq/L (ref 96–112)
Creatinine, Ser: 0.86 mg/dL (ref 0.40–1.20)
GFR: 61.95 mL/min (ref >60.00)
Glucose, Bld: 95 mg/dL (ref 70–99)
Potassium: 4.1 mEq/L (ref 3.5–5.1)
Sodium: 136 mEq/L (ref 135–145)
Total Bilirubin: 0.9 mg/dL (ref 0.2–1.2)
Total Protein: 6.7 g/dL (ref 6.0–8.3)

## 2021-01-18 LAB — TSH: TSH: 1.61 u[IU]/mL (ref 0.35–5.50)

## 2021-01-18 LAB — URINALYSIS
Bilirubin Urine: NEGATIVE
Hgb urine dipstick: NEGATIVE
Ketones, ur: NEGATIVE
Leukocytes,Ua: NEGATIVE
Nitrite: NEGATIVE
Specific Gravity, Urine: 1.015 (ref 1.000–1.030)
Total Protein, Urine: NEGATIVE
Urine Glucose: NEGATIVE
Urobilinogen, UA: 1 (ref 0.0–1.0)
pH: 8 (ref 5.0–8.0)

## 2021-01-18 LAB — LIPID PANEL
Cholesterol: 171 mg/dL (ref 0–200)
HDL: 53.7 mg/dL (ref >39.00)
LDL Cholesterol: 92 mg/dL (ref 0–99)
NonHDL: 116.93
Total CHOL/HDL Ratio: 3
Triglycerides: 127 mg/dL (ref 0.0–149.0)
VLDL: 25.4 mg/dL (ref 0.0–40.0)

## 2021-01-22 ENCOUNTER — Encounter: Payer: Self-pay | Admitting: Internal Medicine

## 2021-01-22 ENCOUNTER — Ambulatory Visit (INDEPENDENT_AMBULATORY_CARE_PROVIDER_SITE_OTHER): Payer: Medicare Other | Admitting: Internal Medicine

## 2021-01-22 ENCOUNTER — Other Ambulatory Visit: Payer: Self-pay

## 2021-01-22 DIAGNOSIS — G4700 Insomnia, unspecified: Secondary | ICD-10-CM | POA: Diagnosis not present

## 2021-01-22 DIAGNOSIS — F411 Generalized anxiety disorder: Secondary | ICD-10-CM

## 2021-01-22 DIAGNOSIS — E785 Hyperlipidemia, unspecified: Secondary | ICD-10-CM | POA: Diagnosis not present

## 2021-01-22 DIAGNOSIS — I10 Essential (primary) hypertension: Secondary | ICD-10-CM | POA: Diagnosis not present

## 2021-01-22 MED ORDER — VALSARTAN-HYDROCHLOROTHIAZIDE 160-12.5 MG PO TABS: 1.0000 | ORAL_TABLET | Freq: Every day | ORAL | 3 refills | Status: DC

## 2021-01-22 MED ORDER — PANTOPRAZOLE SODIUM 40 MG PO TBEC: 40.0000 mg | DELAYED_RELEASE_TABLET | Freq: Every day | ORAL | 3 refills | Status: DC

## 2021-01-22 MED ORDER — ATORVASTATIN CALCIUM 20 MG PO TABS: 20.0000 mg | ORAL_TABLET | ORAL | 1 refills | Status: DC

## 2021-01-22 NOTE — Assessment & Plan Note (Addendum)
Monitor blood pressure at home.  Call if elevated.  Valsartan HCT was reduced to 160-12.5 due to lightheadedness; cont Toprol at current dose

## 2021-01-22 NOTE — Progress Notes (Signed)
Subjective:  Patient ID: Whitney Benson, female    DOB: [birth year]  Age: 86 y.o. MRN: [id number]  CC: Follow-up (4 month f/u)    HPI Whitney Benson presents for HTN, anxiety, dyslipidemia f/u   Outpatient Medications Prior to Visit  Medication Sig Dispense Refill   alendronate (FOSAMAX) 70 MG tablet Take 70 mg by mouth once a week.     aspirin 81 MG tablet Take 81 mg by mouth daily.     cholecalciferol (VITAMIN D) 1000 units tablet Take 1,000 Units by mouth daily.     diazepam (VALIUM) 5 MG tablet TAKE 1 TABLET BY MOUTH EVERY 6 HOURS AS NEEDED FOR ANXIETY DO NOT TAKE WITH ZOLPIDEM 30 tablet 3   glucosamine-chondroitin 500-400 MG tablet Take 1 tablet by mouth daily.     metoprolol succinate (TOPROL-XL) 50 MG 24 hr tablet TAKE 1 TABLET BY MOUTH DAILY WITH A MEAL 90 tablet 2   MULTIPLE VITAMIN PO Take by mouth.     nitroGLYCERIN (NITROSTAT) 0.4 MG SL tablet Place 1 tablet (0.4 mg total) under the tongue every 5 (five) minutes as needed for chest pain. 20 tablet 1   PARoxetine (PAXIL) 10 MG tablet TAKE 1 TABLET BY MOUTH DAILY 90 tablet 1   polyethylene glycol powder (GLYCOLAX/MIRALAX) 17 GM/SCOOP powder Take 17 g by mouth 2 (two) times daily as needed for mild constipation or moderate constipation. 850 g 3   zolpidem (AMBIEN) 5 MG tablet TAKE 1 TABLET BY MOUTH DAILY AT BEDTIME AS NEEDED FOR SLEEP 90 tablet 1   atorvastatin (LIPITOR) 20 MG tablet TAKE 1 TABLET BY MOUTH DAILY 90 tablet 2   pantoprazole (PROTONIX) 40 MG tablet Take 1 tablet (40 mg total) by mouth daily. 90 tablet 3   valsartan-hydrochlorothiazide (DIOVAN HCT) 160-12.5 MG tablet Take 1 tablet by mouth daily. 90 tablet 3   diclofenac Sodium (VOLTAREN) 1 % GEL Apply 1 application topically 4 (four) times daily. (Patient not taking: Reported on 09/26/2020) 100 g 3   triamcinolone ointment (KENALOG) 0.1 % Apply 1 application topically 2 (two) times daily. (Patient not taking: Reported on 09/26/2020) 80 g 2   No facility-administered  medications prior to visit.    ROS: Review of Systems  Constitutional:  Negative for activity change, appetite change, chills, fatigue and unexpected weight change.  HENT:  Negative for congestion, mouth sores and sinus pressure.   Eyes:  Negative for visual disturbance.  Respiratory:  Negative for cough and chest tightness.   Gastrointestinal:  Negative for abdominal pain and nausea.  Genitourinary:  Negative for difficulty urinating, frequency and vaginal pain.  Musculoskeletal:  Positive for arthralgias, back pain and gait problem.  Skin:  Negative for pallor and rash.  Neurological:  Negative for dizziness, tremors, weakness, numbness and headaches.  Psychiatric/Behavioral:  Negative for confusion, decreased concentration, sleep disturbance and suicidal ideas.    Objective:  BP (!) 162/92 (BP Location: Left Arm)   Pulse 64   Temp 97.6 F (36.4 C) (Oral)   Ht 5\' 1"  (1.549 m)   Wt 128 lb (58.1 kg)   SpO2 97%   BMI 24.19 kg/m   BP Readings from Last 3 Encounters:  01/22/21 (!) 162/92  09/26/20 140/60  09/04/20 (!) 151/82    Wt Readings from Last 3 Encounters:  01/22/21 128 lb (58.1 kg)  09/26/20 130 lb (59 kg)  09/04/20 129 lb 1.9 oz (58.6 kg)    Physical Exam Constitutional:      General: She is  not in acute distress.    Appearance: She is well-developed.  HENT:     Head: Normocephalic.     Right Ear: External ear normal.     Left Ear: External ear normal.     Nose: Nose normal.  Eyes:     General:        Right eye: No discharge.        Left eye: No discharge.     Conjunctiva/sclera: Conjunctivae normal.     Pupils: Pupils are equal, round, and reactive to light.  Neck:     Thyroid: No thyromegaly.     Vascular: No JVD.     Trachea: No tracheal deviation.  Cardiovascular:     Rate and Rhythm: Normal rate and regular rhythm.     Heart sounds: Normal heart sounds.  Pulmonary:     Effort: No respiratory distress.     Breath sounds: No stridor. No  wheezing.  Abdominal:     General: Bowel sounds are normal. There is no distension.     Palpations: Abdomen is soft. There is no mass.     Tenderness: There is no abdominal tenderness. There is no guarding or rebound.  Musculoskeletal:        General: No tenderness.     Cervical back: Normal range of motion and neck supple. No rigidity.  Lymphadenopathy:     Cervical: No cervical adenopathy.  Skin:    Findings: No erythema or rash.  Neurological:     Cranial Nerves: No cranial nerve deficit.     Motor: No abnormal muscle tone.     Coordination: Coordination normal.     Gait: Gait abnormal.     Deep Tendon Reflexes: Reflexes normal.  Psychiatric:        Behavior: Behavior normal.        Thought Content: Thought content normal.        Judgment: Judgment normal.    Lab Results  Component Value Date   WBC 4.5 01/18/2021   HGB 12.8 01/18/2021   HCT 38.4 01/18/2021   PLT 217.0 01/18/2021   GLUCOSE 95 01/18/2021   CHOL 171 01/18/2021   TRIG 127.0 01/18/2021   HDL 53.70 01/18/2021   LDLDIRECT 84.1 07/20/2012   LDLCALC 92 01/18/2021   ALT 12 01/18/2021   AST 19 01/18/2021   NA 136 01/18/2021   K 4.1 01/18/2021   CL 101 01/18/2021   CREATININE 0.86 01/18/2021   BUN 13 01/18/2021   CO2 29 01/18/2021   TSH 1.61 01/18/2021    CT Chest Wo Contrast  Result Date: 09/18/2020 CLINICAL DATA:  Abnormal x-ray.  Lung nodule.  Follow-up. EXAM: CT CHEST WITHOUT CONTRAST TECHNIQUE: Multidetector CT imaging of the chest was performed following the standard protocol without IV contrast. COMPARISON:  12/15/2018 FINDINGS: Cardiovascular: Heart size is normal. No pericardial fluid. No visible coronary artery calcification. Mild aortic atherosclerotic calcification. Mediastinum/Nodes: No mediastinal or hilar mass or lymphadenopathy. Small calcified nodes in the left hilum in subcarinal region. Lungs/Pleura: Stable appearing 7 x 9 mm nodule in the anterior right upper lobe, axial image 65. No sign of  growth or contour change. Calcified granuloma in the superior segment of the left lower lobe, unchanged. No new pulmonary finding. Minimal linear scarring in the lingula and at the lung bases. Upper Abdomen: Normal Musculoskeletal: Mild spinal curvature and degenerative change. IMPRESSION: Stable examination. 7 x 9 mm nodule in the anterior right upper lobe is stable in size and contour. Repeat scanning is recommended  at 2 year intervals until 5 years of stability has been established. This recommendation follows the consensus statement: Guidelines for Management of Incidental Pulmonary Nodules Detected on CT Images: From the Fleischner Society 2017; Radiology 2017; 284:228-243. Aortic Atherosclerosis (ICD10-I70.0). Electronically Signed   By: Nelson Chimes M.D.   On: 09/18/2020 09:28    Assessment & Plan:   Problem List Items Addressed This Visit     Anxiety disorder    Stable. Continue with Paxil 10 mg/d  Continue with diazepam 5 mg twice daily as needed anxiety  Potential benefits of a long term benzodiazepines  use as well as potential risks  and complications were explained to the patient and were aknowledged.      Dyslipidemia    Continue with atorvastatin 10 mg a day or every other day if cramps      Relevant Medications   atorvastatin (LIPITOR) 20 MG tablet   Essential hypertension    Monitor blood pressure at home.  Call if elevated.  Valsartan HCT was reduced to 160-12.5 due to lightheadedness; cont Toprol at current dose      Relevant Medications   valsartan-hydrochlorothiazide (DIOVAN HCT) 160-12.5 MG tablet   atorvastatin (LIPITOR) 20 MG tablet   INSOMNIA    Continue with zolpidem 5 mg at at bedtime prn.  Potential benefits of a long term benzodiazepines  use as well as potential risks  and complications were explained to the patient and were aknowledged.         Meds ordered this encounter  Medications   valsartan-hydrochlorothiazide (DIOVAN HCT) 160-12.5 MG tablet     Sig: Take 1 tablet by mouth daily.    Dispense:  90 tablet    Refill:  3   pantoprazole (PROTONIX) 40 MG tablet    Sig: Take 1 tablet (40 mg total) by mouth daily.    Dispense:  90 tablet    Refill:  3   atorvastatin (LIPITOR) 20 MG tablet    Sig: Take 1 tablet (20 mg total) by mouth every other day.    Dispense:  90 tablet    Refill:  1       Follow-up: Return in about 6 months (around 07/23/2021) for Wellness Exam.  Walker Kehr, MD

## 2021-01-22 NOTE — Patient Instructions (Addendum)
TENS unit

## 2021-02-05 DIAGNOSIS — M8580 Other specified disorders of bone density and structure, unspecified site: Secondary | ICD-10-CM | POA: Insufficient documentation

## 2021-02-05 DIAGNOSIS — L900 Lichen sclerosus et atrophicus: Secondary | ICD-10-CM | POA: Insufficient documentation

## 2021-02-05 DIAGNOSIS — E785 Hyperlipidemia, unspecified: Secondary | ICD-10-CM | POA: Insufficient documentation

## 2021-02-05 DIAGNOSIS — Z1231 Encounter for screening mammogram for malignant neoplasm of breast: Secondary | ICD-10-CM | POA: Diagnosis not present

## 2021-02-05 DIAGNOSIS — Z6824 Body mass index (BMI) 24.0-24.9, adult: Secondary | ICD-10-CM | POA: Diagnosis not present

## 2021-02-06 NOTE — Assessment & Plan Note (Signed)
Continue with zolpidem 5 mg at at bedtime prn.  Potential benefits of a long term benzodiazepines  use as well as potential risks  and complications were explained to the patient and were aknowledged.

## 2021-02-06 NOTE — Assessment & Plan Note (Signed)
Stable. Continue with Paxil 10 mg/d  Continue with diazepam 5 mg twice daily as needed anxiety  Potential benefits of a long term benzodiazepines  use as well as potential risks  and complications were explained to the patient and were aknowledged.

## 2021-02-06 NOTE — Assessment & Plan Note (Signed)
Continue with atorvastatin 10 mg a day or every other day if cramps

## 2021-02-15 ENCOUNTER — Telehealth: Payer: Self-pay | Admitting: Internal Medicine

## 2021-02-15 DIAGNOSIS — R030 Elevated blood-pressure reading, without diagnosis of hypertension: Secondary | ICD-10-CM | POA: Diagnosis not present

## 2021-02-15 NOTE — Telephone Encounter (Signed)
Patient requesting a call back to discuss bp and bp medication  Patient's next ov is 03-06-2021  Patient states she can not wait that long to discuss her bp and bp medication  Please call patient

## 2021-02-20 NOTE — Telephone Encounter (Signed)
Tried to call pt to discuss BP and BP meds no answer.

## 2021-02-20 NOTE — Telephone Encounter (Signed)
Spoke with pt and was able to talk with the about her BP and she has stated she has been keeping track of her BP and documenting it till she comes in on the 17th to see Dr. Alain Marion.

## 2021-02-22 ENCOUNTER — Ambulatory Visit: Payer: Medicare Other | Admitting: Internal Medicine

## 2021-03-06 ENCOUNTER — Encounter: Payer: Self-pay | Admitting: Internal Medicine

## 2021-03-06 ENCOUNTER — Ambulatory Visit (INDEPENDENT_AMBULATORY_CARE_PROVIDER_SITE_OTHER): Payer: Medicare Other | Admitting: Internal Medicine

## 2021-03-06 ENCOUNTER — Ambulatory Visit: Payer: Medicare Other | Admitting: Internal Medicine

## 2021-03-06 DIAGNOSIS — I10 Essential (primary) hypertension: Secondary | ICD-10-CM | POA: Diagnosis not present

## 2021-03-06 DIAGNOSIS — F411 Generalized anxiety disorder: Secondary | ICD-10-CM | POA: Diagnosis not present

## 2021-03-06 MED ORDER — VALSARTAN-HYDROCHLOROTHIAZIDE 320-25 MG PO TABS: 1.0000 | ORAL_TABLET | Freq: Every day | ORAL | 3 refills | Status: DC

## 2021-03-06 NOTE — Progress Notes (Signed)
Subjective:  Patient ID: Whitney Benson, female    DOB: 24-Sep-1934  Age: 87 y.o. MRN: [id number]  CC: Hypertension (Pt states wh she saw her gynecologist BP was high, but she been keeping record )   HPI Whitney Benson presents for elevated BP with SBP 140-204.   Outpatient Medications Prior to Visit  Medication Sig Dispense Refill   alendronate (FOSAMAX) 70 MG tablet Take 70 mg by mouth once a week.     aspirin 81 MG tablet Take 81 mg by mouth daily.     atorvastatin (LIPITOR) 20 MG tablet Take 1 tablet (20 mg total) by mouth every other day. 90 tablet 1   cholecalciferol (VITAMIN D) 1000 units tablet Take 1,000 Units by mouth daily.     diazepam (VALIUM) 5 MG tablet TAKE 1 TABLET BY MOUTH EVERY 6 HOURS AS NEEDED FOR ANXIETY DO NOT TAKE WITH ZOLPIDEM 30 tablet 3   glucosamine-chondroitin 500-400 MG tablet Take 1 tablet by mouth daily.     metoprolol succinate (TOPROL-XL) 50 MG 24 hr tablet TAKE 1 TABLET BY MOUTH DAILY WITH A MEAL 90 tablet 2   MULTIPLE VITAMIN PO Take by mouth.     nitroGLYCERIN (NITROSTAT) 0.4 MG SL tablet Place 1 tablet (0.4 mg total) under the tongue every 5 (five) minutes as needed for chest pain. 20 tablet 1   pantoprazole (PROTONIX) 40 MG tablet Take 1 tablet (40 mg total) by mouth daily. 90 tablet 3   PARoxetine (PAXIL) 10 MG tablet TAKE 1 TABLET BY MOUTH DAILY 90 tablet 1   polyethylene glycol powder (GLYCOLAX/MIRALAX) 17 GM/SCOOP powder Take 17 g by mouth 2 (two) times daily as needed for mild constipation or moderate constipation. 850 g 3   zolpidem (AMBIEN) 5 MG tablet TAKE 1 TABLET BY MOUTH DAILY AT BEDTIME AS NEEDED FOR SLEEP 90 tablet 1   valsartan-hydrochlorothiazide (DIOVAN HCT) 160-12.5 MG tablet Take 1 tablet by mouth daily. 90 tablet 3   No facility-administered medications prior to visit.    ROS: Review of Systems  Constitutional:  Negative for activity change, appetite change, chills, fatigue and unexpected weight change.  HENT:  Negative for  congestion, mouth sores and sinus pressure.   Eyes:  Negative for visual disturbance.  Respiratory:  Negative for cough and chest tightness.   Gastrointestinal:  Negative for abdominal pain and nausea.  Genitourinary:  Negative for difficulty urinating, frequency and vaginal pain.  Musculoskeletal:  Negative for back pain and gait problem.  Skin:  Negative for pallor and rash.  Neurological:  Negative for dizziness, tremors, weakness, numbness and headaches.  Psychiatric/Behavioral:  Negative for confusion and sleep disturbance.    Objective:  BP (!) 162/84 (BP Location: Left Arm)    Pulse 60    Temp 98.1 F (36.7 C) (Oral)    Ht 5\' 1"  (1.549 m)    Wt 126 lb 9.6 oz (57.4 kg)    SpO2 96%    BMI 23.92 kg/m   BP Readings from Last 3 Encounters:  03/06/21 (!) 162/84  01/22/21 (!) 162/92  09/26/20 140/60    Wt Readings from Last 3 Encounters:  03/06/21 126 lb 9.6 oz (57.4 kg)  01/22/21 128 lb (58.1 kg)  09/26/20 130 lb (59 kg)    Physical Exam Constitutional:      General: She is not in acute distress.    Appearance: She is well-developed.  HENT:     Head: Normocephalic.     Right Ear: External ear normal.  Left Ear: External ear normal.     Nose: Nose normal.  Eyes:     General:        Right eye: No discharge.        Left eye: No discharge.     Conjunctiva/sclera: Conjunctivae normal.     Pupils: Pupils are equal, round, and reactive to light.  Neck:     Thyroid: No thyromegaly.     Vascular: No JVD.     Trachea: No tracheal deviation.  Cardiovascular:     Rate and Rhythm: Normal rate and regular rhythm.     Heart sounds: Normal heart sounds.  Pulmonary:     Effort: No respiratory distress.     Breath sounds: No stridor. No wheezing.  Abdominal:     General: Bowel sounds are normal. There is no distension.     Palpations: Abdomen is soft. There is no mass.     Tenderness: There is no abdominal tenderness. There is no guarding or rebound.  Musculoskeletal:         General: No tenderness.     Cervical back: Normal range of motion and neck supple. No rigidity.  Lymphadenopathy:     Cervical: No cervical adenopathy.  Skin:    Findings: No erythema or rash.  Neurological:     Cranial Nerves: No cranial nerve deficit.     Motor: No abnormal muscle tone.     Coordination: Coordination normal.     Deep Tendon Reflexes: Reflexes normal.  Psychiatric:        Behavior: Behavior normal.        Thought Content: Thought content normal.        Judgment: Judgment normal.    Lab Results  Component Value Date   WBC 4.5 01/18/2021   HGB 12.8 01/18/2021   HCT 38.4 01/18/2021   PLT 217.0 01/18/2021   GLUCOSE 95 01/18/2021   CHOL 171 01/18/2021   TRIG 127.0 01/18/2021   HDL 53.70 01/18/2021   LDLDIRECT 84.1 07/20/2012   LDLCALC 92 01/18/2021   ALT 12 01/18/2021   AST 19 01/18/2021   NA 136 01/18/2021   K 4.1 01/18/2021   CL 101 01/18/2021   CREATININE 0.86 01/18/2021   BUN 13 01/18/2021   CO2 29 01/18/2021   TSH 1.61 01/18/2021    CT Chest Wo Contrast  Result Date: 09/18/2020 CLINICAL DATA:  Abnormal x-ray.  Lung nodule.  Follow-up. EXAM: CT CHEST WITHOUT CONTRAST TECHNIQUE: Multidetector CT imaging of the chest was performed following the standard protocol without IV contrast. COMPARISON:  12/15/2018 FINDINGS: Cardiovascular: Heart size is normal. No pericardial fluid. No visible coronary artery calcification. Mild aortic atherosclerotic calcification. Mediastinum/Nodes: No mediastinal or hilar mass or lymphadenopathy. Small calcified nodes in the left hilum in subcarinal region. Lungs/Pleura: Stable appearing 7 x 9 mm nodule in the anterior right upper lobe, axial image 65. No sign of growth or contour change. Calcified granuloma in the superior segment of the left lower lobe, unchanged. No new pulmonary finding. Minimal linear scarring in the lingula and at the lung bases. Upper Abdomen: Normal Musculoskeletal: Mild spinal curvature and degenerative  change. IMPRESSION: Stable examination. 7 x 9 mm nodule in the anterior right upper lobe is stable in size and contour. Repeat scanning is recommended at 2 year intervals until 5 years of stability has been established. This recommendation follows the consensus statement: Guidelines for Management of Incidental Pulmonary Nodules Detected on CT Images: From the Fleischner Society 2017; Radiology 2017; 284:228-243. Aortic Atherosclerosis (  ICD10-I70.0). Electronically Signed   By: Nelson Chimes M.D.   On: 09/18/2020 09:28    Assessment & Plan:   Problem List Items Addressed This Visit     Anxiety disorder    Continue with Paxil 10 mg/d  Continue with diazepam 5 mg twice daily as needed anxiety  Potential benefits of a long term benzodiazepines  use as well as potential risks  and complications were explained to the patient and were aknowledged.      Essential hypertension    Worse. Continue w/Metoprolol, increase Valsartan HCT 320/25. Cut back on salt      Relevant Medications   valsartan-hydrochlorothiazide (DIOVAN-HCT) 320-25 MG tablet      Meds ordered this encounter  Medications   valsartan-hydrochlorothiazide (DIOVAN-HCT) 320-25 MG tablet    Sig: Take 1 tablet by mouth daily.    Dispense:  90 tablet    Refill:  3      Follow-up: Return in about 6 weeks (around 04/17/2021) for a follow-up visit.  Walker Kehr, MD

## 2021-03-06 NOTE — Patient Instructions (Signed)
Continue w/Metoprolol, increase Valsartan HCT to 320/25. Cut back on salt

## 2021-03-06 NOTE — Assessment & Plan Note (Addendum)
Worse. Continue w/Metoprolol, increase Valsartan HCT 320/25. Cut back on salt

## 2021-03-26 NOTE — Assessment & Plan Note (Signed)
Continue with Paxil 10 mg/d  Continue with diazepam 5 mg twice daily as needed anxiety  Potential benefits of a long term benzodiazepines  use as well as potential risks  and complications were explained to the patient and were aknowledged.

## 2021-05-07 ENCOUNTER — Other Ambulatory Visit: Payer: Self-pay | Admitting: Internal Medicine

## 2021-05-23 DIAGNOSIS — Z85828 Personal history of other malignant neoplasm of skin: Secondary | ICD-10-CM | POA: Diagnosis not present

## 2021-05-23 DIAGNOSIS — L570 Actinic keratosis: Secondary | ICD-10-CM | POA: Diagnosis not present

## 2021-05-23 DIAGNOSIS — L821 Other seborrheic keratosis: Secondary | ICD-10-CM | POA: Diagnosis not present

## 2021-05-31 DIAGNOSIS — M8588 Other specified disorders of bone density and structure, other site: Secondary | ICD-10-CM | POA: Diagnosis not present

## 2021-06-18 ENCOUNTER — Ambulatory Visit (INDEPENDENT_AMBULATORY_CARE_PROVIDER_SITE_OTHER): Payer: Medicare Other | Admitting: Internal Medicine

## 2021-06-18 ENCOUNTER — Encounter: Payer: Self-pay | Admitting: Internal Medicine

## 2021-06-18 DIAGNOSIS — F418 Other specified anxiety disorders: Secondary | ICD-10-CM

## 2021-06-18 DIAGNOSIS — I10 Essential (primary) hypertension: Secondary | ICD-10-CM

## 2021-06-18 DIAGNOSIS — M5440 Lumbago with sciatica, unspecified side: Secondary | ICD-10-CM

## 2021-06-18 DIAGNOSIS — M1990 Unspecified osteoarthritis, unspecified site: Secondary | ICD-10-CM | POA: Diagnosis not present

## 2021-06-18 MED ORDER — VALSARTAN-HYDROCHLOROTHIAZIDE 320-25 MG PO TABS: 1.0000 | ORAL_TABLET | Freq: Every day | ORAL | 3 refills | Status: DC

## 2021-06-18 MED ORDER — PAROXETINE HCL 10 MG PO TABS: 5.0000 mg | ORAL_TABLET | Freq: Every day | ORAL | 1 refills | Status: DC

## 2021-06-18 MED ORDER — METOPROLOL SUCCINATE ER 50 MG PO TB24: 50.0000 mg | ORAL_TABLET | Freq: Two times a day (BID) | ORAL | 3 refills | Status: DC

## 2021-06-18 NOTE — Assessment & Plan Note (Addendum)
Worse ?Continue w/Metoprolol - increase to 50 mg bid, increase Valsartan HCT 320/25 qd. Cut back on salt ?

## 2021-06-18 NOTE — Assessment & Plan Note (Signed)
Pt takes Paxil 5 mg/d for anxiety 

## 2021-06-18 NOTE — Progress Notes (Signed)
? ?Subjective:  ?Patient ID: Whitney Benson, female    DOB: Jan 04, 1935  Age: 87 y.o. MRN: [id number] ? ?CC: No chief complaint on file. ? ? ?HPI ?[name] presents for HTN - elevated BP: SBP 130-177-188 on [place] HCT 320-25 ?Pt takes Paxil 5 mg/d for anxiety ?F/u GERD, OA ? ?Outpatient Medications Prior to Visit  ?Medication Sig Dispense Refill  ? alendronate (FOSAMAX) 70 MG tablet Take 70 mg by mouth once a week.    ? aspirin 81 MG tablet Take 81 mg by mouth daily.    ? atorvastatin (LIPITOR) 20 MG tablet Take 1 tablet (20 mg total) by mouth every other day. 90 tablet 1  ? cholecalciferol (VITAMIN D) 1000 units tablet Take 1,000 Units by mouth daily.    ? diazepam (VALIUM) 5 MG tablet TAKE 1 TABLET BY MOUTH EVERY 6 HOURS AS NEEDED FOR ANXIETY DO NOT TAKE WITH ZOLPIDEM 30 tablet 3  ? glucosamine-chondroitin 500-400 MG tablet Take 1 tablet by mouth daily.    ? MULTIPLE VITAMIN PO Take by mouth.    ? nitroGLYCERIN (NITROSTAT) 0.4 MG SL tablet Place 1 tablet (0.4 mg total) under the tongue every 5 (five) minutes as needed for chest pain. 20 tablet 1  ? pantoprazole (PROTONIX) 40 MG tablet Take 1 tablet (40 mg total) by mouth daily. 90 tablet 3  ? polyethylene glycol powder (GLYCOLAX/MIRALAX) 17 GM/SCOOP powder Take 17 g by mouth 2 (two) times daily as needed for mild constipation or moderate constipation. 850 g 3  ? zolpidem (AMBIEN) 5 MG tablet TAKE 1 TABLET BY MOUTH DAILY AT BEDTIME AS NEEDED FOR SLEEP 90 tablet 1  ? metoprolol succinate (TOPROL-XL) 50 MG 24 hr tablet TAKE 1 TABLET BY MOUTH DAILY WITH A MEAL 90 tablet 2  ? PARoxetine (PAXIL) 10 MG tablet TAKE 1 TABLET BY MOUTH DAILY 90 tablet 1  ? valsartan-hydrochlorothiazide (DIOVAN-HCT) 320-25 MG tablet Take 1 tablet by mouth daily. 90 tablet 3  ? ?No facility-administered medications prior to visit.  ? ? ?ROS: ?Review of Systems  ?Constitutional:  Negative for activity change, appetite change, chills, fatigue and unexpected weight change.  ?HENT:  Negative for  congestion, mouth sores and sinus pressure.   ?Eyes:  Negative for visual disturbance.  ?Respiratory:  Negative for cough and chest tightness.   ?Gastrointestinal:  Negative for abdominal pain and nausea.  ?Genitourinary:  Negative for difficulty urinating, frequency and vaginal pain.  ?Musculoskeletal:  Positive for arthralgias. Negative for back pain and gait problem.  ?Skin:  Negative for pallor and rash.  ?Neurological:  Negative for dizziness, tremors, weakness, numbness and headaches.  ?Psychiatric/Behavioral:  Negative for confusion and sleep disturbance. The patient is nervous/anxious.   ? ?Objective:  ?BP 140/90 (BP Location: Left Arm, Patient Position: Sitting, Cuff Size: Normal)   Pulse 63   Temp 97.7 ?F (36.5 ?C) (Oral)   Ht '5\' 1"'$  (1.549 m)   Wt 125 lb (56.7 kg)   SpO2 98%   BMI 23.62 kg/m?  ? ?BP Readings from Last 3 Encounters:  ?06/18/21 140/90  ?03/06/21 (!) 162/84  ?01/22/21 (!) 162/92  ? ? ?Wt Readings from Last 3 Encounters:  ?06/18/21 125 lb (56.7 kg)  ?03/06/21 126 lb 9.6 oz (57.4 kg)  ?01/22/21 128 lb (58.1 kg)  ? ? ?Physical Exam ?Constitutional:   ?   General: She is not in acute distress. ?   Appearance: Normal appearance. She is well-developed.  ?HENT:  ?   Head: Normocephalic.  ?  Right Ear: External ear normal.  ?   Left Ear: External ear normal.  ?   Nose: Nose normal.  ?Eyes:  ?   General:     ?   Right eye: No discharge.     ?   Left eye: No discharge.  ?   Conjunctiva/sclera: Conjunctivae normal.  ?   Pupils: Pupils are equal, round, and reactive to light.  ?Neck:  ?   Thyroid: No thyromegaly.  ?   Vascular: No JVD.  ?   Trachea: No tracheal deviation.  ?Cardiovascular:  ?   Rate and Rhythm: Normal rate and regular rhythm.  ?   Heart sounds: Normal heart sounds.  ?Pulmonary:  ?   Effort: No respiratory distress.  ?   Breath sounds: No stridor. No wheezing.  ?Abdominal:  ?   General: Bowel sounds are normal. There is no distension.  ?   Palpations: Abdomen is soft. There is no  mass.  ?   Tenderness: There is no abdominal tenderness. There is no guarding or rebound.  ?Musculoskeletal:     ?   General: Tenderness present.  ?   Cervical back: Normal range of motion and neck supple. No rigidity.  ?Lymphadenopathy:  ?   Cervical: No cervical adenopathy.  ?Skin: ?   Findings: No erythema or rash.  ?Neurological:  ?   Mental Status: She is oriented to person, place, and time.  ?   Cranial Nerves: No cranial nerve deficit.  ?   Motor: No abnormal muscle tone.  ?   Coordination: Coordination normal.  ?   Gait: Gait normal.  ?   Deep Tendon Reflexes: Reflexes normal.  ?Psychiatric:     ?   Behavior: Behavior normal.     ?   Thought Content: Thought content normal.     ?   Judgment: Judgment normal.  ? ? ?Lab Results  ?Component Value Date  ? WBC 4.5 01/18/2021  ? HGB 12.8 01/18/2021  ? HCT 38.4 01/18/2021  ? PLT 217.0 01/18/2021  ? GLUCOSE 95 01/18/2021  ? CHOL 171 01/18/2021  ? TRIG 127.0 01/18/2021  ? HDL 53.70 01/18/2021  ? LDLDIRECT 84.1 07/20/2012  ? [place] 92 01/18/2021  ? ALT 12 01/18/2021  ? AST 19 01/18/2021  ? NA 136 01/18/2021  ? K 4.1 01/18/2021  ? CL 101 01/18/2021  ? CREATININE 0.86 01/18/2021  ? BUN 13 01/18/2021  ? CO2 29 01/18/2021  ? TSH 1.61 01/18/2021  ? ? ?CT Chest Wo Contrast ? ?Result Date: 09/18/2020 ?CLINICAL DATA:  Abnormal x-ray.  Lung nodule.  Follow-up. EXAM: CT CHEST WITHOUT CONTRAST TECHNIQUE: Multidetector CT imaging of the chest was performed following the standard protocol without IV contrast. COMPARISON:  12/15/2018 FINDINGS: Cardiovascular: Heart size is normal. No pericardial fluid. No visible coronary artery calcification. Mild aortic atherosclerotic calcification. Mediastinum/Nodes: No mediastinal or hilar mass or lymphadenopathy. Small calcified nodes in the left hilum in subcarinal region. Lungs/Pleura: Stable appearing 7 x 9 mm nodule in the anterior right upper lobe, axial image 65. No sign of growth or contour change. Calcified granuloma in the superior  segment of the left lower lobe, unchanged. No new pulmonary finding. Minimal linear scarring in the lingula and at the lung bases. Upper Abdomen: Normal Musculoskeletal: Mild spinal curvature and degenerative change. IMPRESSION: Stable examination. 7 x 9 mm nodule in the anterior right upper lobe is stable in size and contour. Repeat scanning is recommended at 2 year intervals until 5 years of stability  has been established. This recommendation follows the consensus statement: Guidelines for Management of Incidental Pulmonary Nodules Detected on CT Images: From the Fleischner Society 2017; Radiology 2017; 284:228-243. Aortic Atherosclerosis (ICD10-I70.0). Electronically Signed   By: Nelson Chimes M.D.   On: 09/18/2020 09:28  ? ? ?Assessment & Plan:  ? ?Problem List Items Addressed This Visit   ? ? Situational anxiety  ?  Pt takes Paxil 5 mg/d for anxiety ?  ?  ? Relevant Medications  ? PARoxetine (PAXIL) 10 MG tablet  ? Essential hypertension  ?  Worse ?Continue w/Metoprolol - increase to 50 mg bid, increase Valsartan HCT 320/25 qd. Cut back on salt ?  ?  ? Relevant Medications  ? metoprolol succinate (TOPROL-XL) 50 MG 24 hr tablet  ? valsartan-hydrochlorothiazide (DIOVAN-HCT) 320-25 MG tablet  ? Osteoarthritis  ?  Blue-Emu cream was recommended to use 2-3 times a day ?  ?  ? BACK PAIN, LUMBAR  ?  Blue-Emu cream was recommended to use 2-3 times a day ? ? ?  ?  ?  ? ? ?Meds ordered this encounter  ?Medications  ? metoprolol succinate (TOPROL-XL) 50 MG 24 hr tablet  ?  Sig: Take 1 tablet (50 mg total) by mouth in the morning and at bedtime. Take with or immediately following a meal.  ?  Dispense:  180 tablet  ?  Refill:  3  ? valsartan-hydrochlorothiazide (DIOVAN-HCT) 320-25 MG tablet  ?  Sig: Take 1 tablet by mouth daily.  ?  Dispense:  90 tablet  ?  Refill:  3  ? PARoxetine (PAXIL) 10 MG tablet  ?  Sig: Take 0.5 tablets (5 mg total) by mouth daily.  ?  Dispense:  90 tablet  ?  Refill:  1  ?  ? ? ?Follow-up: Return in  about 2 months (around 08/18/2021) for a follow-up visit. ? ?Walker Kehr, MD ?

## 2021-06-18 NOTE — Assessment & Plan Note (Signed)
Blue-Emu cream was recommended to use 2-3 times a day ? ?

## 2021-06-18 NOTE — Patient Instructions (Signed)
Blue-Emu cream -- use 2-3 times a day ? ?

## 2021-06-29 DIAGNOSIS — L723 Sebaceous cyst: Secondary | ICD-10-CM | POA: Diagnosis not present

## 2021-08-07 ENCOUNTER — Other Ambulatory Visit: Payer: Self-pay | Admitting: Internal Medicine

## 2021-09-04 ENCOUNTER — Ambulatory Visit (INDEPENDENT_AMBULATORY_CARE_PROVIDER_SITE_OTHER): Payer: Medicare Other | Admitting: Internal Medicine

## 2021-09-04 ENCOUNTER — Encounter: Payer: Self-pay | Admitting: Internal Medicine

## 2021-09-04 VITALS — BP 128/70 | HR 62 | Temp 98.0°F | Ht 61.0 in | Wt 122.0 lb

## 2021-09-04 DIAGNOSIS — E785 Hyperlipidemia, unspecified: Secondary | ICD-10-CM | POA: Diagnosis not present

## 2021-09-04 DIAGNOSIS — G4700 Insomnia, unspecified: Secondary | ICD-10-CM

## 2021-09-04 DIAGNOSIS — F418 Other specified anxiety disorders: Secondary | ICD-10-CM | POA: Diagnosis not present

## 2021-09-04 DIAGNOSIS — Z0000 Encounter for general adult medical examination without abnormal findings: Secondary | ICD-10-CM

## 2021-09-04 DIAGNOSIS — I10 Essential (primary) hypertension: Secondary | ICD-10-CM | POA: Diagnosis not present

## 2021-09-04 MED ORDER — ATORVASTATIN CALCIUM 20 MG PO TABS: 20.0000 mg | ORAL_TABLET | ORAL | 1 refills | Status: DC

## 2021-09-04 MED ORDER — DIAZEPAM 5 MG PO TABS: ORAL_TABLET | ORAL | 3 refills | Status: DC

## 2021-09-04 NOTE — Progress Notes (Signed)
Subjective:  Patient ID: Whitney Benson, female    DOB: 1934-02-23  Age: 87 y.o. MRN: [id number]  CC: Follow-up (2 month follow up and wants to talk about a cyst she had about a month to two month  ago)   HPI Whitney Benson presents for dyslipidemia, HTN, insomnia  Outpatient Medications Prior to Visit  Medication Sig Dispense Refill   alendronate (FOSAMAX) 70 MG tablet Take 70 mg by mouth once a week.     aspirin 81 MG tablet Take 81 mg by mouth daily.     cholecalciferol (VITAMIN D) 1000 units tablet Take 1,000 Units by mouth daily.     glucosamine-chondroitin 500-400 MG tablet Take 1 tablet by mouth daily.     metoprolol succinate (TOPROL-XL) 50 MG 24 hr tablet Take 1 tablet (50 mg total) by mouth in the morning and at bedtime. Take with or immediately following a meal. 180 tablet 3   MULTIPLE VITAMIN PO Take by mouth.     nitroGLYCERIN (NITROSTAT) 0.4 MG SL tablet Place 1 tablet (0.4 mg total) under the tongue every 5 (five) minutes as needed for chest pain. 20 tablet 1   pantoprazole (PROTONIX) 40 MG tablet Take 1 tablet (40 mg total) by mouth daily. 90 tablet 3   PARoxetine (PAXIL) 10 MG tablet TAKE 1 TABLET BY MOUTH DAILY 90 tablet 1   polyethylene glycol powder (GLYCOLAX/MIRALAX) 17 GM/SCOOP powder Take 17 g by mouth 2 (two) times daily as needed for mild constipation or moderate constipation. 850 g 3   valsartan-hydrochlorothiazide (DIOVAN-HCT) 160-12.5 MG tablet Take 1 tablet by mouth daily.     valsartan-hydrochlorothiazide (DIOVAN-HCT) 320-25 MG tablet Take 1 tablet by mouth daily. 90 tablet 3   zolpidem (AMBIEN) 5 MG tablet TAKE 1 TABLET BY MOUTH DAILY AT BEDTIME AS NEEDED FOR SLEEP 90 tablet 1   atorvastatin (LIPITOR) 20 MG tablet Take 1 tablet (20 mg total) by mouth every other day. 90 tablet 1   diazepam (VALIUM) 5 MG tablet TAKE 1 TABLET BY MOUTH EVERY 6 HOURS AS NEEDED FOR ANXIETY DO NOT TAKE WITH ZOLPIDEM 30 tablet 3   No facility-administered medications prior to visit.     ROS: Review of Systems  Constitutional:  Negative for activity change, appetite change, chills, fatigue and unexpected weight change.  HENT:  Negative for congestion, mouth sores and sinus pressure.   Eyes:  Negative for visual disturbance.  Respiratory:  Negative for cough and chest tightness.   Gastrointestinal:  Negative for abdominal pain and nausea.  Genitourinary:  Negative for difficulty urinating, frequency and vaginal pain.  Musculoskeletal:  Positive for arthralgias. Negative for back pain and gait problem.  Skin:  Negative for pallor and rash.  Neurological:  Negative for dizziness, tremors, weakness, numbness and headaches.  Psychiatric/Behavioral:  Negative for confusion, sleep disturbance and suicidal ideas. The patient is nervous/anxious.     Objective:  BP 128/70 (BP Location: Right Arm, Patient Position: Sitting, Cuff Size: Large)   Pulse 62   Temp 98 F (36.7 C) (Oral)   Ht '5\' 1"'$  (1.549 m)   Wt 122 lb (55.3 kg)   SpO2 97%   BMI 23.05 kg/m   BP Readings from Last 3 Encounters:  09/04/21 128/70  06/18/21 140/90  03/06/21 (!) 162/84    Wt Readings from Last 3 Encounters:  09/04/21 122 lb (55.3 kg)  06/18/21 125 lb (56.7 kg)  03/06/21 126 lb 9.6 oz (57.4 kg)    Physical Exam Constitutional:  General: She is not in acute distress.    Appearance: She is well-developed. She is obese.  HENT:     Head: Normocephalic.     Right Ear: External ear normal.     Left Ear: External ear normal.     Nose: Nose normal.  Eyes:     General:        Right eye: No discharge.        Left eye: No discharge.     Conjunctiva/sclera: Conjunctivae normal.     Pupils: Pupils are equal, round, and reactive to light.  Neck:     Thyroid: No thyromegaly.     Vascular: No JVD.     Trachea: No tracheal deviation.  Cardiovascular:     Rate and Rhythm: Normal rate and regular rhythm.     Heart sounds: Normal heart sounds.  Pulmonary:     Effort: No respiratory  distress.     Breath sounds: No stridor. No wheezing.  Abdominal:     General: Bowel sounds are normal. There is no distension.     Palpations: Abdomen is soft. There is no mass.     Tenderness: There is no abdominal tenderness. There is no guarding or rebound.  Musculoskeletal:        General: No tenderness.     Cervical back: Normal range of motion and neck supple. No rigidity.  Lymphadenopathy:     Cervical: No cervical adenopathy.  Skin:    Findings: No erythema or rash.  Neurological:     Mental Status: She is oriented to person, place, and time.     Cranial Nerves: No cranial nerve deficit.     Motor: No abnormal muscle tone.     Coordination: Coordination normal.     Deep Tendon Reflexes: Reflexes normal.  Psychiatric:        Behavior: Behavior normal.        Thought Content: Thought content normal.        Judgment: Judgment normal.     Lab Results  Component Value Date   WBC 4.5 01/18/2021   HGB 12.8 01/18/2021   HCT 38.4 01/18/2021   PLT 217.0 01/18/2021   GLUCOSE 95 01/18/2021   CHOL 171 01/18/2021   TRIG 127.0 01/18/2021   HDL 53.70 01/18/2021   LDLDIRECT 84.1 07/20/2012   LDLCALC 92 01/18/2021   ALT 12 01/18/2021   AST 19 01/18/2021   NA 136 01/18/2021   K 4.1 01/18/2021   CL 101 01/18/2021   CREATININE 0.86 01/18/2021   BUN 13 01/18/2021   CO2 29 01/18/2021   TSH 1.61 01/18/2021    CT Chest Wo Contrast  Result Date: 09/18/2020 CLINICAL DATA:  Abnormal x-ray.  Lung nodule.  Follow-up. EXAM: CT CHEST WITHOUT CONTRAST TECHNIQUE: Multidetector CT imaging of the chest was performed following the standard protocol without IV contrast. COMPARISON:  12/15/2018 FINDINGS: Cardiovascular: Heart size is normal. No pericardial fluid. No visible coronary artery calcification. Mild aortic atherosclerotic calcification. Mediastinum/Nodes: No mediastinal or hilar mass or lymphadenopathy. Small calcified nodes in the left hilum in subcarinal region. Lungs/Pleura: Stable  appearing 7 x 9 mm nodule in the anterior right upper lobe, axial image 65. No sign of growth or contour change. Calcified granuloma in the superior segment of the left lower lobe, unchanged. No new pulmonary finding. Minimal linear scarring in the lingula and at the lung bases. Upper Abdomen: Normal Musculoskeletal: Mild spinal curvature and degenerative change. IMPRESSION: Stable examination. 7 x 9 mm nodule in the  anterior right upper lobe is stable in size and contour. Repeat scanning is recommended at 2 year intervals until 5 years of stability has been established. This recommendation follows the consensus statement: Guidelines for Management of Incidental Pulmonary Nodules Detected on CT Images: From the Fleischner Society 2017; Radiology 2017; 284:228-243. Aortic Atherosclerosis (ICD10-I70.0). Electronically Signed   By: Nelson Chimes M.D.   On: 09/18/2020 09:28    Assessment & Plan:   Problem List Items Addressed This Visit     Dyslipidemia   Relevant Medications   atorvastatin (LIPITOR) 20 MG tablet   Other Relevant Orders   TSH   CBC with Differential/Platelet   Lipid panel   Essential hypertension    Continue w/Metoprolol -  50 mg bid, Valsartan HCT 320/25 qd. Cut back on salt      Relevant Medications   valsartan-hydrochlorothiazide (DIOVAN-HCT) 160-12.5 MG tablet   atorvastatin (LIPITOR) 20 MG tablet   Other Relevant Orders   Urinalysis   CBC with Differential/Platelet   Lipid panel   Comprehensive metabolic panel   Insomnia disorder    Chronic  Continue with zolpidem 5 mg at at bedtime prn.  Potential benefits of a long term benzodiazepines  use as well as potential risks  and complications were explained to the patient and were aknowledged.      Situational anxiety    Pt takes Paxil 5 mg/d for anxiety      Relevant Medications   diazepam (VALIUM) 5 MG tablet   Well adult exam - Primary   Relevant Orders   TSH   Urinalysis   CBC with Differential/Platelet    Lipid panel   Comprehensive metabolic panel      Meds ordered this encounter  Medications   atorvastatin (LIPITOR) 20 MG tablet    Sig: Take 1 tablet (20 mg total) by mouth every other day.    Dispense:  90 tablet    Refill:  1   diazepam (VALIUM) 5 MG tablet    Sig: TAKE 1 TABLET BY MOUTH EVERY 6 HOURS AS NEEDED FOR ANXIETY DO NOT TAKE WITH ZOLPIDEM    Dispense:  30 tablet    Refill:  3      Follow-up: Return in about 6 months (around 03/07/2022) for Wellness Exam.  Walker Kehr, MD

## 2021-09-04 NOTE — Assessment & Plan Note (Signed)
Continue w/Metoprolol -  50 mg bid, Valsartan HCT 320/25 qd. Cut back on salt 

## 2021-09-04 NOTE — Assessment & Plan Note (Signed)
Pt takes Paxil 5 mg/d for anxiety

## 2021-09-04 NOTE — Assessment & Plan Note (Signed)
Chronic  Continue with zolpidem 5 mg at at bedtime prn.  Potential benefits of a long term benzodiazepines  use as well as potential risks  and complications were explained to the patient and were aknowledged.

## 2021-09-25 DIAGNOSIS — B3731 Acute candidiasis of vulva and vagina: Secondary | ICD-10-CM | POA: Diagnosis not present

## 2021-09-28 ENCOUNTER — Telehealth: Payer: Self-pay | Admitting: Internal Medicine

## 2021-09-28 NOTE — Telephone Encounter (Signed)
Left message for patient to call back to schedule Medicare Annual Wellness Visit   Last AWV  09/26/20  Please schedule at anytime with [facility] if patient calls the office back.      Any questions, please call me at [phone]

## 2021-10-23 ENCOUNTER — Telehealth: Payer: Self-pay

## 2021-10-23 NOTE — Telephone Encounter (Signed)
MD is out of the office until 10/29/21. Pls advise on msg../lmb

## 2021-10-23 NOTE — Telephone Encounter (Signed)
Called pt no answer LMOM w/MD response.Marland KitchenChryl Heck

## 2021-10-23 NOTE — Telephone Encounter (Signed)
Due for AWV this can be covered during that visit easily. Flu shots are different every flu season. Likely there will be a covid-19 booster out this fall later which is not out yet.

## 2021-10-23 NOTE — Telephone Encounter (Signed)
Pt is calling to see what Vaccine or Boosters are needed this season.  Please advise

## 2021-10-30 DIAGNOSIS — H524 Presbyopia: Secondary | ICD-10-CM | POA: Diagnosis not present

## 2021-10-30 DIAGNOSIS — H52203 Unspecified astigmatism, bilateral: Secondary | ICD-10-CM | POA: Diagnosis not present

## 2021-10-30 DIAGNOSIS — Z961 Presence of intraocular lens: Secondary | ICD-10-CM | POA: Diagnosis not present

## 2021-11-12 ENCOUNTER — Other Ambulatory Visit: Payer: Self-pay | Admitting: Internal Medicine

## 2021-11-21 DIAGNOSIS — L309 Dermatitis, unspecified: Secondary | ICD-10-CM | POA: Diagnosis not present

## 2022-01-01 DIAGNOSIS — N390 Urinary tract infection, site not specified: Secondary | ICD-10-CM | POA: Diagnosis not present

## 2022-01-01 DIAGNOSIS — R5383 Other fatigue: Secondary | ICD-10-CM | POA: Diagnosis not present

## 2022-01-01 DIAGNOSIS — I10 Essential (primary) hypertension: Secondary | ICD-10-CM | POA: Diagnosis not present

## 2022-01-02 ENCOUNTER — Encounter (HOSPITAL_COMMUNITY): Payer: Self-pay

## 2022-01-02 ENCOUNTER — Other Ambulatory Visit: Payer: Self-pay

## 2022-01-02 ENCOUNTER — Emergency Department (HOSPITAL_COMMUNITY): Payer: Medicare Other

## 2022-01-02 ENCOUNTER — Ambulatory Visit: Payer: Medicare Other | Admitting: Internal Medicine

## 2022-01-02 ENCOUNTER — Emergency Department (HOSPITAL_COMMUNITY)
Admission: EM | Admit: 2022-01-02 | Discharge: 2022-01-02 | Disposition: A | Discharge status: Home / Self Care | Payer: Medicare Other | Attending: Emergency Medicine | Admitting: Emergency Medicine

## 2022-01-02 DIAGNOSIS — R519 Headache, unspecified: Secondary | ICD-10-CM | POA: Diagnosis not present

## 2022-01-02 DIAGNOSIS — R5383 Other fatigue: Secondary | ICD-10-CM | POA: Insufficient documentation

## 2022-01-02 DIAGNOSIS — Z7982 Long term (current) use of aspirin: Secondary | ICD-10-CM | POA: Diagnosis not present

## 2022-01-02 DIAGNOSIS — R197 Diarrhea, unspecified: Secondary | ICD-10-CM | POA: Insufficient documentation

## 2022-01-02 DIAGNOSIS — R140 Abdominal distension (gaseous): Secondary | ICD-10-CM | POA: Diagnosis not present

## 2022-01-02 DIAGNOSIS — Z79899 Other long term (current) drug therapy: Secondary | ICD-10-CM | POA: Insufficient documentation

## 2022-01-02 DIAGNOSIS — I10 Essential (primary) hypertension: Secondary | ICD-10-CM | POA: Insufficient documentation

## 2022-01-02 LAB — COMPREHENSIVE METABOLIC PANEL
ALT: 20 U/L (ref 0–44)
AST: 19 U/L (ref 15–41)
Albumin: 3.8 g/dL (ref 3.5–5.0)
Alkaline Phosphatase: 35 U/L — ABNORMAL LOW (ref 38–126)
Anion gap: 9 (ref 5–15)
BUN: 12 mg/dL (ref 8–23)
CO2: 28 mmol/L (ref 22–32)
Calcium: 9.1 mg/dL (ref 8.9–10.3)
Chloride: 93 mmol/L — ABNORMAL LOW (ref 98–111)
Creatinine, Ser: 0.69 mg/dL (ref 0.44–1.00)
GFR, Estimated: >60 mL/min (ref >60)
Glucose, Bld: 107 mg/dL — ABNORMAL HIGH (ref 70–99)
Potassium: 3.4 mmol/L — ABNORMAL LOW (ref 3.5–5.1)
Sodium: 130 mmol/L — ABNORMAL LOW (ref 135–145)
Total Bilirubin: 0.9 mg/dL (ref 0.3–1.2)
Total Protein: 7 g/dL (ref 6.5–8.1)

## 2022-01-02 LAB — CBC WITH DIFFERENTIAL/PLATELET
Abs Immature Granulocytes: 0.02 10*3/uL (ref 0.00–0.07)
Basophils Absolute: 0 10*3/uL (ref 0.0–0.1)
Basophils Relative: 1 %
Eosinophils Absolute: 0.2 10*3/uL (ref 0.0–0.5)
Eosinophils Relative: 4 %
HCT: 37 % (ref 36.0–46.0)
Hemoglobin: 12.7 g/dL (ref 12.0–15.0)
Immature Granulocytes: 0 %
Lymphocytes Relative: 26 %
Lymphs Abs: 1.5 10*3/uL (ref 0.7–4.0)
MCH: 31.8 pg (ref 26.0–34.0)
MCHC: 34.3 g/dL (ref 30.0–36.0)
MCV: 92.7 fL (ref 80.0–100.0)
Monocytes Absolute: 0.6 10*3/uL (ref 0.1–1.0)
Monocytes Relative: 12 %
Neutro Abs: 3.1 10*3/uL (ref 1.7–7.7)
Neutrophils Relative %: 57 %
Platelets: 275 10*3/uL (ref 150–400)
RBC: 3.99 MIL/uL (ref 3.87–5.11)
RDW: 11.8 % (ref 11.5–15.5)
WBC: 5.5 10*3/uL (ref 4.0–10.5)
nRBC: 0 % (ref 0.0–0.2)

## 2022-01-02 MED ORDER — IRBESARTAN 150 MG PO TABS
150.0000 mg | ORAL_TABLET | Freq: Once | ORAL | Status: AC
  Administered 2022-01-02: 150 mg via ORAL
  Filled 2022-01-02: qty 1

## 2022-01-02 MED ORDER — HYDROCHLOROTHIAZIDE 12.5 MG PO TABS
12.5000 mg | ORAL_TABLET | Freq: Once | ORAL | Status: AC
  Administered 2022-01-02: 12.5 mg via ORAL
  Filled 2022-01-02: qty 1

## 2022-01-02 MED ORDER — ACETAMINOPHEN 325 MG PO TABS
650.0000 mg | ORAL_TABLET | Freq: Once | ORAL | Status: AC
  Administered 2022-01-02: 650 mg via ORAL
  Filled 2022-01-02: qty 2

## 2022-01-02 MED ORDER — NITROGLYCERIN 2 % TD OINT
1.0000 [in_us] | TOPICAL_OINTMENT | Freq: Once | TRANSDERMAL | Status: AC
  Administered 2022-01-02: 1 [in_us] via TOPICAL
  Filled 2022-01-02: qty 1

## 2022-01-02 MED ORDER — AMLODIPINE BESYLATE 5 MG PO TABS
5.0000 mg | ORAL_TABLET | Freq: Once | ORAL | Status: DC
  Filled 2022-01-02: qty 1

## 2022-01-02 MED ORDER — VALSARTAN-HYDROCHLOROTHIAZIDE 160-12.5 MG PO TABS: 1.0000 | ORAL_TABLET | Freq: Once | ORAL | Status: DC

## 2022-01-02 NOTE — ED Provider Notes (Signed)
[facility] DEPT Provider Note   CSN: [id number] Arrival date & time: 01/02/22  1038     History  Chief Complaint  Patient presents with   Hypertension    NORABELLE KONDO is a 87 y.o. female.   Hypertension  Patient presents for hypertension.  Medical history includes HTN, anxiety, GERD, HLD.  Per chart review, she was recently diagnosed with a UTI.  She was prescribed Macrobid and started taking it 5 days ago.  After she started taking it, she experienced nausea, headache, dizziness, fatigue.  She was seen in outpatient office yesterday.  At that time, BP was 210/110. Currently she endorses fatigue only.  She states that her dysuria symptoms have resolved.  Home blood pressure medications include metoprolol, valsartan, HCTZ.  She is prescribed Valium.  She states that she typically does not take Valium.  She did take a dose yesterday due to increased psychosocial stressors at home.  She used to check her blood pressure at home but currently does not.  Blood pressure at doctor's office yesterday prompted her to come to the ED today.     Home Medications Prior to Admission medications   Medication Sig Start Date End Date Taking? Authorizing Provider  alendronate (FOSAMAX) 70 MG tablet Take 70 mg by mouth once a week. 04/06/19   [provider]  aspirin 81 MG tablet Take 81 mg by mouth daily.    [provider]  atorvastatin (LIPITOR) 20 MG tablet Take 1 tablet (20 mg total) by mouth every other day. 09/04/21   Plotnikov, Evie Lacks, MD  cholecalciferol (VITAMIN D) 1000 units tablet Take 1,000 Units by mouth daily.    [provider]  diazepam (VALIUM) 5 MG tablet TAKE 1 TABLET BY MOUTH EVERY 6 HOURS AS NEEDED FOR ANXIETY DO NOT TAKE WITH ZOLPIDEM 09/04/21   Plotnikov, Evie Lacks, MD  glucosamine-chondroitin 500-400 MG tablet Take 1 tablet by mouth daily.    [provider]  metoprolol succinate (TOPROL-XL) 50 MG 24 hr tablet  Take 1 tablet (50 mg total) by mouth in the morning and at bedtime. Take with or immediately following a meal. 06/18/21   Plotnikov, Evie Lacks, MD  MULTIPLE VITAMIN PO Take by mouth.    [provider]  nitroGLYCERIN (NITROSTAT) 0.4 MG SL tablet Place 1 tablet (0.4 mg total) under the tongue every 5 (five) minutes as needed for chest pain. 03/01/15   Plotnikov, Evie Lacks, MD  pantoprazole (PROTONIX) 40 MG tablet Take 1 tablet (40 mg total) by mouth daily. 01/22/21   Plotnikov, Evie Lacks, MD  PARoxetine (PAXIL) 10 MG tablet TAKE 1 TABLET BY MOUTH DAILY 08/07/21   Plotnikov, Evie Lacks, MD  polyethylene glycol powder (GLYCOLAX/MIRALAX) 17 GM/SCOOP powder Take 17 g by mouth 2 (two) times daily as needed for mild constipation or moderate constipation. 12/01/18   Plotnikov, Evie Lacks, MD  valsartan-hydrochlorothiazide (DIOVAN-HCT) 160-12.5 MG tablet Take 1 tablet by mouth daily. 03/31/21   [provider]  valsartan-hydrochlorothiazide (DIOVAN-HCT) 320-25 MG tablet Take 1 tablet by mouth daily. 06/18/21   Plotnikov, Evie Lacks, MD  zolpidem (AMBIEN) 5 MG tablet TAKE 1 TABLET BY MOUTH DAILY AT BEDTIME AS NEEDED FOR SLEEP 11/12/21   Plotnikov, Evie Lacks, MD      Allergies    Hydrocodone    Review of Systems   Review of Systems  Constitutional:  Positive for fatigue.  Psychiatric/Behavioral:  The patient is nervous/anxious.   All other systems reviewed and are negative.  Physical Exam Updated Vital Signs BP (!) 178/80   Pulse (!) 52   Temp 97.6 F (36.4 C) (Oral)   Resp 18   Ht '5\' 1"'$  (1.549 m)   Wt 55 kg   SpO2 96%   BMI 22.91 kg/m  Physical Exam Vitals and nursing note reviewed.  Constitutional:      General: She is not in acute distress.    Appearance: Normal appearance. She is well-developed. She is not ill-appearing, toxic-appearing or diaphoretic.  HENT:     Head: Normocephalic and atraumatic.     Right Ear: External ear normal.     Left Ear: External ear normal.      Nose: Nose normal.     Mouth/Throat:     Mouth: Mucous membranes are moist.     Pharynx: Oropharynx is clear.  Eyes:     Extraocular Movements: Extraocular movements intact.     Conjunctiva/sclera: Conjunctivae normal.  Cardiovascular:     Rate and Rhythm: Normal rate and regular rhythm.     Heart sounds: No murmur heard. Pulmonary:     Effort: Pulmonary effort is normal. No respiratory distress.     Breath sounds: Normal breath sounds.  Abdominal:     General: There is no distension.     Palpations: Abdomen is soft.     Tenderness: There is no abdominal tenderness.  Musculoskeletal:        General: No swelling. Normal range of motion.     Cervical back: Normal range of motion and neck supple.     Right lower leg: No edema.     Left lower leg: No edema.  Skin:    General: Skin is warm and dry.     Capillary Refill: Capillary refill takes less than 2 seconds.     Coloration: Skin is not jaundiced or pale.  Neurological:     General: No focal deficit present.     Mental Status: She is alert and oriented to person, place, and time.     Cranial Nerves: No cranial nerve deficit.     Sensory: No sensory deficit.     Motor: No weakness.     Coordination: Coordination normal.  Psychiatric:        Mood and Affect: Mood normal.        Behavior: Behavior normal.        Thought Content: Thought content normal.        Judgment: Judgment normal.     ED Results / Procedures / Treatments   Labs (all labs ordered are listed, but only abnormal results are displayed) Labs Reviewed  COMPREHENSIVE METABOLIC PANEL - Abnormal; Notable for the following components:      Result Value   Sodium 130 (*)    Potassium 3.4 (*)    Chloride 93 (*)    Glucose, Bld 107 (*)    Alkaline Phosphatase 35 (*)    All other components within normal limits  CBC WITH DIFFERENTIAL/PLATELET  URINALYSIS, ROUTINE W REFLEX MICROSCOPIC    EKG EKG Interpretation  Date/Time:  Wednesday January 02 2022  12:13:05 EST Ventricular Rate:  52 PR Interval:  158 QRS Duration: 83 QT Interval:  467 QTC Calculation: 435 R Axis:   67 Text Interpretation: Sinus rhythm Borderline repolarization abnormality Baseline wander in lead(s) I II III aVL aVF V5 Confirmed by Godfrey Pick [phone]) on 01/02/2022 5:26:57 PM  Radiology CT Head Wo Contrast  Result Date: 01/02/2022 CLINICAL DATA:  New onset headache EXAM: CT HEAD WITHOUT  CONTRAST TECHNIQUE: Contiguous axial images were obtained from the base of the skull through the vertex without intravenous contrast. RADIATION DOSE REDUCTION: This exam was performed according to the departmental dose-optimization program which includes automated exposure control, adjustment of the mA and/or kV according to patient size and/or use of iterative reconstruction technique. COMPARISON:  Head CT December 01, 2017 FINDINGS: Brain: Mild diffuse age-related global parenchymal volume loss. No evidence of acute infarction, hemorrhage, hydrocephalus, extra-axial collection or mass lesion/mass effect. Vascular: No hyperdense vessel or unexpected calcification. Skull: Normal. Negative for fracture or focal lesion. Sinuses/Orbits: Visualized portions of the paranasal sinuses and ethmoid air cells are predominantly clear. Orbits are grossly unremarkable. Other: Mastoid air cells are predominantly clear. IMPRESSION: No acute intracranial abnormality. Electronically Signed   By: Dahlia Bailiff M.D.   On: 01/02/2022 12:55    Procedures Procedures    Medications Ordered in ED Medications  nitroGLYCERIN (NITROGLYN) 2 % ointment 1 inch (1 inch Topical Given 01/02/22 1803)  acetaminophen (TYLENOL) tablet 650 mg (650 mg Oral Given 01/02/22 1804)  irbesartan (AVAPRO) tablet 150 mg (150 mg Oral Given 01/02/22 1825)    And  hydrochlorothiazide (HYDRODIURIL) tablet 12.5 mg (12.5 mg Oral Given 01/02/22 1825)    ED Course/ Medical Decision Making/ A&P                           Medical Decision  Making Risk OTC drugs. Prescription drug management.   Patient presents for asymptomatic hypertension.  She has recently undergone treatment for UTI and states that her dysuria symptoms have resolved.  She did experience some diarrhea, abdominal bloating, and headache which she attributes to her antibiotic.  She stopped taking her antibiotic last night.  She was seen in clinic yesterday and blood pressure at that time was 210/110.  This prompted her to come to the ED today.  Per chart review, home blood pressure medications are valsartan, HCTZ, and metoprolol.  She states that she typically takes these in the morning.  Pressure on arrival in the ED was 166/74.  While in the ED waiting room it increased to 215/80.  When reviewing EMR, patient does have a history of elevated blood pressures while in doctor's office.  She has never been diagnosed with whitecoat hypertension.  She used to check her blood pressure at home but no longer does.  On exam, she is well-appearing.  She denies any current physical complaints.  She has no focal neurologic deficits.  Prior to being bedded in the ED, diagnostic work-up was initiated.  Laboratory work-up and CT of head were reassuring.  On reviewing home medications with patient and her daughter, it does seem that patient stopped taking her valsartan/HCTZ combo pill.  It appears that this medication was prescribed in February of this year and dose was increased in May.  Patient states that she is currently only taking her metoprolol and is unsure when she stopped taking her other blood pressure medicine.  Home blood pressure medicine was given in the ED.  Patient had improved blood pressure.  She was advised to resume taking her valsartan-HCTZ and to follow-up with her primary care doctor.  She was discharged in good condition.        Final Clinical Impression(s) / ED Diagnoses Final diagnoses:  Hypertension, unspecified type    Rx / DC Orders ED Discharge Orders      None         Godfrey Pick, MD 01/02/22  2011  

## 2022-01-02 NOTE — ED Provider Triage Note (Signed)
Emergency Medicine Provider Triage Evaluation Note  Whitney Benson , a 87 y.o. female  was evaluated in triage.  Pt complains of elevated BP in doctors office with SBP over 200.  Reports having worsening headaches recently which is abnormal for her.  Headache is in the back part of her head and radiates down the right side of her posterior neck.  She is currently taking metoprolol and has been compliant with this.  Se also reports having worsening dizziness, and feeling unsteady on her feet over the past week.  Review of Systems  Positive:  Negative:   Physical Exam  BP (!) 166/74   Pulse (!) 58   Temp 97.8 F (36.6 C) (Oral)   Resp 17   Ht '5\' 1"'$  (1.549 m)   Wt 55 kg   SpO2 97%   BMI 22.91 kg/m  Gen:   Awake, no distress   Resp:  Normal effort  MSK:   Moves extremities without difficulty  Other:    Medical Decision Making  Medically screening exam initiated at 11:27 AM.  Appropriate orders placed.  CHRISSA MEETZE was informed that the remainder of the evaluation will be completed by another provider, this initial triage assessment does not replace that evaluation, and the importance of remaining in the ED until their evaluation is complete.     Adolphus Birchwood, PA-C 01/02/22 1128

## 2022-01-02 NOTE — ED Notes (Signed)
Patient verbalizes understanding of discharge instructions. Opportunity for questioning and answers were provided. Armband removed by staff, pt discharged from ED. Ambulated out to lobby with daughter  

## 2022-01-02 NOTE — Discharge Instructions (Signed)
Resume taking your valsartan-hydrochlorothiazide blood pressure medicine.  Continue taking your metoprolol.  Measure your blood pressures at home and discuss further with your primary care doctor.  Return to the emergency department at any time for any new or worsening symptoms of concern.

## 2022-01-02 NOTE — ED Triage Notes (Signed)
Pt reports sent to ED for HTN, BP 210/80 at doctors office.  Pt reports not missing any doses of HTN meds.  Pt reports starting new abt last week

## 2022-01-03 ENCOUNTER — Telehealth: Payer: Self-pay | Admitting: Internal Medicine

## 2022-01-03 NOTE — Telephone Encounter (Signed)
Patient daughter called and said patient was seen in the hospital yesterday because of high BP, She scheduled for next available which is 01/14/22. She wanted to know if this is ok or what they should be doing in the meantime. She said you can either call her back at [phone] (Daughter Gerald Stabs) or Patient [phone] for the patient

## 2022-01-04 NOTE — Telephone Encounter (Signed)
Noted.  I am sorry.  Please take valsartan-HCT 1 tablet twice a day and metoprolol 1 tablet twice a day until June sees me in the office.  Thank you

## 2022-01-07 NOTE — Telephone Encounter (Signed)
Called pt gave her MD response. Pt states her BP is back to normal. She is doing what hospitalist inform her to do until she see Dr. Camila Li on the 11/27. Inform pt I will let MD know and to bring BP reading to appt next wee.Whitney KitchenJohny Chess

## 2022-01-14 ENCOUNTER — Encounter: Payer: Self-pay | Admitting: Internal Medicine

## 2022-01-14 ENCOUNTER — Ambulatory Visit (INDEPENDENT_AMBULATORY_CARE_PROVIDER_SITE_OTHER): Payer: Medicare Other | Admitting: Internal Medicine

## 2022-01-14 VITALS — BP 124/76 | HR 50 | Temp 97.7°F | Ht 61.0 in | Wt 114.0 lb

## 2022-01-14 DIAGNOSIS — R911 Solitary pulmonary nodule: Secondary | ICD-10-CM

## 2022-01-14 DIAGNOSIS — F418 Other specified anxiety disorders: Secondary | ICD-10-CM

## 2022-01-14 DIAGNOSIS — G4700 Insomnia, unspecified: Secondary | ICD-10-CM | POA: Diagnosis not present

## 2022-01-14 DIAGNOSIS — R634 Abnormal weight loss: Secondary | ICD-10-CM | POA: Diagnosis not present

## 2022-01-14 DIAGNOSIS — F411 Generalized anxiety disorder: Secondary | ICD-10-CM | POA: Diagnosis not present

## 2022-01-14 DIAGNOSIS — E785 Hyperlipidemia, unspecified: Secondary | ICD-10-CM

## 2022-01-14 DIAGNOSIS — I1A0 Resistant hypertension: Secondary | ICD-10-CM

## 2022-01-14 MED ORDER — ZOLPIDEM TARTRATE 5 MG PO TABS: ORAL_TABLET | ORAL | 1 refills | Status: DC

## 2022-01-14 MED ORDER — METOPROLOL SUCCINATE ER 50 MG PO TB24: 50.0000 mg | ORAL_TABLET | Freq: Two times a day (BID) | ORAL | 3 refills | Status: DC

## 2022-01-14 MED ORDER — ATORVASTATIN CALCIUM 20 MG PO TABS: 10.0000 mg | ORAL_TABLET | ORAL | 1 refills | Status: DC

## 2022-01-14 NOTE — Assessment & Plan Note (Addendum)
Chronic - worse Increase zolpidem to 5-10 mg at at bedtime prn or 5 mg at hs and 5 mg at 3 am if awake  Potential benefits of a long term benzodiazepines  use as well as potential risks  and complications were explained to the patient and were aknowledged.

## 2022-01-14 NOTE — Assessment & Plan Note (Signed)
Take Lipitor 10 mg qod due to LE pain, weakness D/c if problem

## 2022-01-14 NOTE — Assessment & Plan Note (Addendum)
Increase Paxil to 10 mg/d  Continue with diazepam 5 mg twice daily as needed anxiety  Potential benefits of a long term benzodiazepines  use as well as potential risks  and complications were explained to the patient and were aknowledged. Consider Remeron

## 2022-01-14 NOTE — Assessment & Plan Note (Signed)
Continue w/Metoprolol -  50 mg bid, Valsartan HCT 320/25 qd. Cut back on salt

## 2022-01-14 NOTE — Assessment & Plan Note (Signed)
Due repeat CT summer 2024

## 2022-01-14 NOTE — Progress Notes (Signed)
Subjective:  Patient ID: Whitney Benson, female    DOB: Sep 08, 1934  Age: 87 y.o. MRN: [id number]  CC: Follow-up   HPI Whitney Benson presents for high BP, stress w/son living w/Whitney Benson F/u dyslipidemia, anxiety C/o insomnia - worse Pt is here w/dtr Whitney Benson  Outpatient Medications Prior to Visit  Medication Sig Dispense Refill   alendronate (FOSAMAX) 70 MG tablet Take 70 mg by mouth once a week.     aspirin 81 MG tablet Take 81 mg by mouth daily.     cholecalciferol (VITAMIN D) 1000 units tablet Take 1,000 Units by mouth daily.     clotrimazole-betamethasone (LOTRISONE) cream APPLY TO THE AFFECTED AND SURROUNDING AREAS OF SKIN BY TOPICAL ROUTE 2 TIMES PER DAY IN THE MORNING AND EVENING FOR 2 WEEKS     diazepam (VALIUM) 5 MG tablet TAKE 1 TABLET BY MOUTH EVERY 6 HOURS AS NEEDED FOR ANXIETY DO NOT TAKE WITH ZOLPIDEM 30 tablet 3   diclofenac Sodium (VOLTAREN) 1 % GEL      Glucosamine Sulfate 500 MG TABS Take by oral route.     glucosamine-chondroitin 500-400 MG tablet Take 1 tablet by mouth daily.     ketoconazole (NIZORAL) 2 % cream APPLY TO THE AFFECTED AREA(S) BY TOPICAL ROUTE ONCE DAILY FOR TWO WEEKS     MULTIPLE VITAMIN PO Take by mouth.     pantoprazole (PROTONIX) 40 MG tablet Take 1 tablet (40 mg total) by mouth daily. 90 tablet 3   PARoxetine (PAXIL) 10 MG tablet TAKE 1 TABLET BY MOUTH DAILY 90 tablet 1   terconazole (TERAZOL 3) 0.8 % vaginal cream Insert 1 applicatorful every day by vaginal route at dinner for 3 days.     triamcinolone ointment (KENALOG) 0.1 %      valsartan-hydrochlorothiazide (DIOVAN-HCT) 320-25 MG tablet Take 1 tablet by mouth daily. 90 tablet 3   amoxicillin-clavulanate (AUGMENTIN) 500-125 MG tablet Take 1 tablet every 8 hours by oral route for 5 days.     atorvastatin (LIPITOR) 20 MG tablet Take 1 tablet (20 mg total) by mouth every other day. 90 tablet 1   clobetasol cream (TEMOVATE) 0.05 % APPLY A THIN LAYER TO THE AFFECTED AREA(S) BY TOPICAL ROUTE at bedtime      clobetasol ointment (TEMOVATE) 0.05 % APPLY A THIN LAYER TO THE AFFECTED AREAS TOPICALLY TWICE DAILY     metoprolol succinate (TOPROL-XL) 50 MG 24 hr tablet Take 1 tablet (50 mg total) by mouth in the morning and at bedtime. Take with or immediately following a meal. 180 tablet 3   nitrofurantoin, macrocrystal-monohydrate, (MACROBID) 100 MG capsule TAKE 1 CAPSULE BY MOUTH EVERY 12 HOURS FOR 7 DAYS     zolpidem (AMBIEN) 5 MG tablet TAKE 1 TABLET BY MOUTH DAILY AT BEDTIME AS NEEDED FOR SLEEP 90 tablet 1   nitroGLYCERIN (NITROSTAT) 0.4 MG SL tablet Place 1 tablet (0.4 mg total) under the tongue every 5 (five) minutes as needed for chest pain. (Patient not taking: Reported on 01/14/2022) 20 tablet 1   polyethylene glycol powder (GLYCOLAX/MIRALAX) 17 GM/SCOOP powder Take 17 g by mouth 2 (two) times daily as needed for mild constipation or moderate constipation. (Patient not taking: Reported on 01/14/2022) 850 g 3   valsartan-hydrochlorothiazide (DIOVAN-HCT) 160-12.5 MG tablet Take 1 tablet by mouth daily. (Patient not taking: Reported on 01/14/2022)     No facility-administered medications prior to visit.    ROS: Review of Systems  Constitutional:  Positive for fatigue and unexpected weight change. Negative for  activity change, appetite change and chills.  HENT:  Negative for congestion, mouth sores and sinus pressure.   Eyes:  Negative for visual disturbance.  Respiratory:  Negative for cough and chest tightness.   Gastrointestinal:  Negative for abdominal pain and nausea.  Genitourinary:  Positive for frequency. Negative for difficulty urinating and vaginal pain.  Musculoskeletal:  Positive for arthralgias. Negative for back pain and gait problem.  Skin:  Negative for pallor and rash.  Neurological:  Negative for dizziness, tremors, weakness, numbness and headaches.  Psychiatric/Behavioral:  Positive for decreased concentration and sleep disturbance. Negative for confusion and suicidal ideas.  The patient is nervous/anxious.     Objective:  BP 124/76 (BP Location: Right Arm, Patient Position: Sitting, Cuff Size: Normal)   Pulse (!) 50   Temp 97.7 F (36.5 C) (Oral)   Ht '5\' 1"'$  (1.549 m)   Wt 114 lb (51.7 kg)   SpO2 98%   BMI 21.54 kg/m   BP Readings from Last 3 Encounters:  01/14/22 124/76  01/02/22 (!) 178/80  09/04/21 128/70    Wt Readings from Last 3 Encounters:  01/14/22 114 lb (51.7 kg)  01/02/22 121 lb 4.1 oz (55 kg)  09/04/21 122 lb (55.3 kg)    Physical Exam Constitutional:      General: She is not in acute distress.    Appearance: Normal appearance. She is well-developed.  HENT:     Head: Normocephalic.     Right Ear: External ear normal.     Left Ear: External ear normal.     Nose: Nose normal.  Eyes:     General:        Right eye: No discharge.        Left eye: No discharge.     Conjunctiva/sclera: Conjunctivae normal.     Pupils: Pupils are equal, round, and reactive to light.  Neck:     Thyroid: No thyromegaly.     Vascular: No JVD.     Trachea: No tracheal deviation.  Cardiovascular:     Rate and Rhythm: Normal rate and regular rhythm.     Heart sounds: Normal heart sounds.  Pulmonary:     Effort: No respiratory distress.     Breath sounds: No stridor. No wheezing.  Abdominal:     General: Bowel sounds are normal. There is no distension.     Palpations: Abdomen is soft. There is no mass.     Tenderness: There is no abdominal tenderness. There is no guarding or rebound.  Musculoskeletal:        General: No tenderness.     Cervical back: Normal range of motion and neck supple. No rigidity.  Lymphadenopathy:     Cervical: No cervical adenopathy.  Skin:    Findings: No erythema or rash.  Neurological:     Mental Status: She is oriented to person, place, and time.     Cranial Nerves: No cranial nerve deficit.     Motor: No abnormal muscle tone.     Coordination: Coordination normal.     Deep Tendon Reflexes: Reflexes normal.   Psychiatric:        Behavior: Behavior normal.        Thought Content: Thought content normal.        Judgment: Judgment normal.   Thin Hard hearing  Lab Results  Component Value Date   WBC 5.5 01/02/2022   HGB 12.7 01/02/2022   HCT 37.0 01/02/2022   PLT 275 01/02/2022   GLUCOSE 107 (H)  01/02/2022   CHOL 171 01/18/2021   TRIG 127.0 01/18/2021   HDL 53.70 01/18/2021   LDLDIRECT 84.1 07/20/2012   LDLCALC 92 01/18/2021   ALT 20 01/02/2022   AST 19 01/02/2022   NA 130 (L) 01/02/2022   K 3.4 (L) 01/02/2022   CL 93 (L) 01/02/2022   CREATININE 0.69 01/02/2022   BUN 12 01/02/2022   CO2 28 01/02/2022   TSH 1.61 01/18/2021    CT Head Wo Contrast  Result Date: 01/02/2022 CLINICAL DATA:  New onset headache EXAM: CT HEAD WITHOUT CONTRAST TECHNIQUE: Contiguous axial images were obtained from the base of the skull through the vertex without intravenous contrast. RADIATION DOSE REDUCTION: This exam was performed according to the departmental dose-optimization program which includes automated exposure control, adjustment of the mA and/or kV according to patient size and/or use of iterative reconstruction technique. COMPARISON:  Head CT December 01, 2017 FINDINGS: Brain: Mild diffuse age-related global parenchymal volume loss. No evidence of acute infarction, hemorrhage, hydrocephalus, extra-axial collection or mass lesion/mass effect. Vascular: No hyperdense vessel or unexpected calcification. Skull: Normal. Negative for fracture or focal lesion. Sinuses/Orbits: Visualized portions of the paranasal sinuses and ethmoid air cells are predominantly clear. Orbits are grossly unremarkable. Other: Mastoid air cells are predominantly clear. IMPRESSION: No acute intracranial abnormality. Electronically Signed   By: Dahlia Bailiff M.D.   On: 01/02/2022 12:55    Assessment & Plan:   Problem List Items Addressed This Visit     Anxiety disorder    Increase Paxil to 10 mg/d  Continue with diazepam 5 mg  twice daily as needed anxiety  Potential benefits of a long term benzodiazepines  use as well as potential risks  and complications were explained to the patient and were aknowledged. Consider Remeron      Dyslipidemia    Take Lipitor 10 mg qod due to LE pain, weakness D/c if problem        Relevant Medications   atorvastatin (LIPITOR) 20 MG tablet   HTN (hypertension)    Continue w/Metoprolol -  50 mg bid, Valsartan HCT 320/25 qd. Cut back on salt      Relevant Medications   atorvastatin (LIPITOR) 20 MG tablet   metoprolol succinate (TOPROL-XL) 50 MG 24 hr tablet   Insomnia disorder    Chronic - worse Increase zolpidem to 5-10 mg at at bedtime prn or 5 mg at hs and 5 mg at 3 am if awake  Potential benefits of a long term benzodiazepines  use as well as potential risks  and complications were explained to the patient and were aknowledged.      Situational anxiety - Primary    On Paxil 10 mg/d for anxiety Use Ensure      Solitary pulmonary nodule    Due repeat CT summer 2024      Weight loss    Stress related vs other Obtain labs Check TSH Use Ensure Consider Remeron         Meds ordered this encounter  Medications   atorvastatin (LIPITOR) 20 MG tablet    Sig: Take 0.5 tablets (10 mg total) by mouth every other day.    Dispense:  90 tablet    Refill:  1   metoprolol succinate (TOPROL-XL) 50 MG 24 hr tablet    Sig: Take 1 tablet (50 mg total) by mouth in the morning and at bedtime. Take with or immediately following a meal.    Dispense:  180 tablet    Refill:  3   zolpidem (AMBIEN) 5 MG tablet    Sig: TAKE 1 TABLET BY MOUTH DAILY AT BEDTIME AS NEEDED FOR SLEEP, can repeat in 4 hrs if needed    Dispense:  180 tablet    Refill:  1      Follow-up: Return for Wellness Exam.  Walker Kehr, MD

## 2022-01-14 NOTE — Assessment & Plan Note (Addendum)
On Paxil 10 mg/d for anxiety Use Ensure

## 2022-01-14 NOTE — Patient Instructions (Addendum)
[facility] Emergency room  Address: [address], [place], [place] [address] Phone: [phone]    [place] [address] [place], [place] [address]

## 2022-01-14 NOTE — Assessment & Plan Note (Addendum)
Stress related vs other Obtain labs Check TSH Use Ensure Consider Remeron

## 2022-01-15 DIAGNOSIS — I10 Essential (primary) hypertension: Secondary | ICD-10-CM | POA: Diagnosis not present

## 2022-02-06 DIAGNOSIS — Z1231 Encounter for screening mammogram for malignant neoplasm of breast: Secondary | ICD-10-CM | POA: Diagnosis not present

## 2022-02-14 ENCOUNTER — Telehealth: Payer: Self-pay

## 2022-02-14 MED ORDER — AZITHROMYCIN 250 MG PO TABS: ORAL_TABLET | ORAL | 0 refills | Status: DC

## 2022-02-14 NOTE — Telephone Encounter (Signed)
Notified pt MD called antibiotic to pof.Marland KitchenJohny Chess

## 2022-02-14 NOTE — Addendum Note (Signed)
Addended by: Cassandria Anger on: 02/14/2022 01:35 PM   Modules accepted: Orders

## 2022-02-14 NOTE — Telephone Encounter (Signed)
Pts daughter stated that pt has had a very persistent cough that is not having any relief with any OTC meds.  **Pt daughter states the at home COVID test is neg. Pt daughter wants to know if some meds can be sent in.  I was able to schedule a VV tomorrow if needed. Pt daughter Gerald Stabs asked that she gets a call back with instructions on what to do.

## 2022-02-14 NOTE — Telephone Encounter (Signed)
Will send in a Z-Pak.  Office visit if not better.  Thank you

## 2022-02-15 ENCOUNTER — Telehealth: Payer: Medicare Other | Admitting: Internal Medicine

## 2022-02-15 DIAGNOSIS — I10 Essential (primary) hypertension: Secondary | ICD-10-CM

## 2022-03-06 ENCOUNTER — Ambulatory Visit (INDEPENDENT_AMBULATORY_CARE_PROVIDER_SITE_OTHER): Payer: Medicare Other

## 2022-03-06 VITALS — Ht 61.0 in | Wt 119.0 lb

## 2022-03-06 DIAGNOSIS — Z0000 Encounter for general adult medical examination without abnormal findings: Secondary | ICD-10-CM | POA: Diagnosis not present

## 2022-03-06 NOTE — Progress Notes (Addendum)
Virtual Visit via Telephone Note  I connected with  Whitney Benson on 03/06/22 at  3:45 PM EST by telephone and verified that I am speaking with the correct person using two identifiers.  Location: Patient: Home Provider: [place] Persons participating in the virtual visit: [place]   I discussed the limitations, risks, security and privacy concerns of performing an evaluation and management service by telephone and the availability of in person appointments. The patient expressed understanding and agreed to proceed.  Interactive audio and video telecommunications were attempted between this nurse and patient, however failed, due to patient having technical difficulties OR patient did not have access to video capability.  We continued and completed visit with audio only.  Some vital signs may be absent or patient reported.   Sheral Flow, LPN  Subjective:   Whitney Benson is a 88 y.o. female who presents for Medicare Annual (Subsequent) preventive examination.  Review of Systems     Cardiac Risk Factors include: advanced age (>78mn, >>63women);dyslipidemia;family history of premature cardiovascular disease;hypertension     Objective:    Today's Vitals   03/06/22 1549  Weight: 119 lb (54 kg)  Height: '5\' 1"'$  (1.549 m)  PainSc: 0-No pain   Body mass index is 22.48 kg/m.     03/06/2022    3:59 PM 01/02/2022   11:24 AM 09/26/2020    1:35 PM 12/01/2017    9:59 AM  Advanced Directives  Does Patient Have a Medical Advance Directive? Yes No Yes No  Type of [place]  Living will;Healthcare Power of Attorney   Does patient want to make changes to medical advance directive?   No - Patient declined   Copy of HSantelin Chart? No - copy requested  No - copy requested   Would patient like information on creating a medical advance directive?  No - Patient declined  Yes (ED -  Information included in AVS)    Current Medications (verified) Outpatient Encounter Medications as of 03/06/2022  Medication Sig   aspirin 81 MG tablet Take 81 mg by mouth daily.   atorvastatin (LIPITOR) 20 MG tablet Take 0.5 tablets (10 mg total) by mouth every other day. (Patient taking differently: Take 10 mg by mouth daily. Twice a week)   cholecalciferol (VITAMIN D) 1000 units tablet Take 1,000 Units by mouth daily.   diazepam (VALIUM) 5 MG tablet TAKE 1 TABLET BY MOUTH EVERY 6 HOURS AS NEEDED FOR ANXIETY DO NOT TAKE WITH ZOLPIDEM   Glucosamine Sulfate 500 MG TABS Take by oral route.   glucosamine-chondroitin 500-400 MG tablet Take 1 tablet by mouth daily.   metoprolol succinate (TOPROL-XL) 50 MG 24 hr tablet Take 1 tablet (50 mg total) by mouth in the morning and at bedtime. Take with or immediately following a meal.   MULTIPLE VITAMIN PO Take by mouth.   PARoxetine (PAXIL) 10 MG tablet TAKE 1 TABLET BY MOUTH DAILY   zolpidem (AMBIEN) 5 MG tablet TAKE 1 TABLET BY MOUTH DAILY AT BEDTIME AS NEEDED FOR SLEEP, can repeat in 4 hrs if needed   alendronate (FOSAMAX) 70 MG tablet Take 70 mg by mouth once a week. (Patient not taking: Reported on 03/06/2022)   azithromycin (ZITHROMAX Z-PAK) 250 MG tablet As directed (Patient not taking: Reported on 03/06/2022)   clotrimazole-betamethasone (LOTRISONE) cream APPLY TO THE AFFECTED AND SURROUNDING AREAS OF SKIN BY TOPICAL ROUTE 2 TIMES PER DAY IN THE MORNING AND  EVENING FOR 2 WEEKS (Patient not taking: Reported on 03/06/2022)   diclofenac Sodium (VOLTAREN) 1 % GEL  (Patient not taking: Reported on 03/06/2022)   ketoconazole (NIZORAL) 2 % cream APPLY TO THE AFFECTED AREA(S) BY TOPICAL ROUTE ONCE DAILY FOR TWO WEEKS (Patient not taking: Reported on 03/06/2022)   nitroGLYCERIN (NITROSTAT) 0.4 MG SL tablet Place 1 tablet (0.4 mg total) under the tongue every 5 (five) minutes as needed for chest pain. (Patient not taking: Reported on 01/14/2022)   pantoprazole  (PROTONIX) 40 MG tablet Take 1 tablet (40 mg total) by mouth daily. (Patient not taking: Reported on 03/06/2022)   polyethylene glycol powder (GLYCOLAX/MIRALAX) 17 GM/SCOOP powder Take 17 g by mouth 2 (two) times daily as needed for mild constipation or moderate constipation. (Patient not taking: Reported on 01/14/2022)   terconazole (TERAZOL 3) 0.8 % vaginal cream Insert 1 applicatorful every day by vaginal route at dinner for 3 days. (Patient not taking: Reported on 03/06/2022)   triamcinolone ointment (KENALOG) 0.1 %  (Patient not taking: Reported on 03/06/2022)   valsartan-hydrochlorothiazide (DIOVAN-HCT) 320-25 MG tablet Take 1 tablet by mouth daily. (Patient not taking: Reported on 03/06/2022)   No facility-administered encounter medications on file as of 03/06/2022.    Allergies (verified) Hydrocodone   History: Past Medical History:  Diagnosis Date   DJD (degenerative joint disease)    History of asthma    History of colon polyps    Hyperlipidemia    Hypertension    Insomnia    Past Surgical History:  Procedure Laterality Date   DILATION AND CURETTAGE OF UTERUS     OTHER SURGICAL HISTORY  09/2011   mammogram and bone density   POLYPECTOMY     TUBAL LIGATION     Family History  Problem Relation Age of Onset   Cancer Mother    Hypertension Mother    Cancer Father    Social History   Socioeconomic History   Marital status: Widowed    Spouse name: Not on file   Number of children: Not on file   Years of education: Not on file   Highest education level: Not on file  Occupational History   Not on file  Tobacco Use   Smoking status: Former   Smokeless tobacco: Never  Vaping Use   Vaping Use: Never used  Substance and Sexual Activity   Alcohol use: Yes    Comment: wine   Drug use: No   Sexual activity: Not on file  Other Topics Concern   Not on file  Social History Narrative   HSG   Married '61   2 sons- '69, '78, 2 daughters- '63, '65, 4 grandchildren    Worked- Magazine features editor, Medical sales representative work, homemaker   Social Determinants of Radio broadcast assistant Strain: Low Risk  (03/06/2022)   Overall Financial Resource Strain (CARDIA)    Difficulty of Paying Living Expenses: Not hard at all  Food Insecurity: No Food Insecurity (03/06/2022)   Hunger Vital Sign    Worried About Running Out of Food in the Last Year: Never true    [place] in the Last Year: Never true  Transportation Needs: No Transportation Needs (03/06/2022)   PRAPARE - Hydrologist (Medical): No    Lack of Transportation (Non-Medical): No  Physical Activity: Sufficiently Active (03/06/2022)   Exercise Vital Sign    Days of Exercise per Week: 5 days    Minutes of Exercise per Session: 30 min  Stress: No Stress Concern Present (03/06/2022)   [place]    Feeling of Stress : Not at all  Social Connections: Socially Isolated (03/06/2022)   Social Connection and Isolation Panel [NHANES]    Frequency of Communication with Friends and Family: More than three times a week    Frequency of Social Gatherings with Friends and Family: More than three times a week    Attends Religious Services: Never    Marine scientist or Organizations: No    Attends Archivist Meetings: Never    Marital Status: Widowed    Tobacco Counseling Counseling given: Not Answered   Clinical Intake:  Pre-visit preparation completed: Yes  Pain : No/denies pain Pain Score: 0-No pain     BMI - recorded: 22.48 Nutritional Status: BMI of 19-24  Normal Nutritional Risks: None Diabetes: No  How often do you need to have someone help you when you read instructions, pamphlets, or other written materials from your doctor or pharmacy?: 1 - Never What is the last grade level you completed in school?: HSG  Diabetic? No  Interpreter Needed?: No  Information entered by :: Lisette Abu,  LPN.   Activities of Daily Living    03/06/2022    4:08 PM  In your present state of health, do you have any difficulty performing the following activities:  Hearing? 0  Vision? 0  Difficulty concentrating or making decisions? 0  Walking or climbing stairs? 0  Dressing or bathing? 0  Doing errands, shopping? 0  Preparing Food and eating ? N  Using the Toilet? N  In the past six months, have you accidently leaked urine? N  Do you have problems with loss of bowel control? N  Managing your Medications? N  Managing your Finances? N  Housekeeping or managing your Housekeeping? N    Patient Care Team: Plotnikov, Evie Lacks, MD as PCP - General (Internal Medicine) Arvella Nigh, MD as Consulting Physician (Obstetrics and Gynecology) Rutherford Guys, MD as Consulting Physician (Ophthalmology)  Indicate any recent Medical Services you may have received from other than [facility] providers in the past year (date may be approximate).     Assessment:   This is a routine wellness examination for Whitney Benson.  Hearing/Vision screen Hearing Screening - Comments:: Denies hearing difficulties.   Vision Screening - Comments:: Wears rx glasses - up to date with routine eye exams with Rutherford Guys, MD.   Dietary issues and exercise activities discussed: Current Exercise Habits: Home exercise routine, Type of exercise: walking, Time (Minutes): 30, Frequency (Times/Week): 5, Weekly Exercise (Minutes/Week): 150, Intensity: Moderate, Exercise limited by: orthopedic condition(s)   Goals Addressed   None   Depression Screen    03/06/2022    4:03 PM 01/14/2022    4:02 PM 09/04/2021    1:21 PM 09/26/2020    1:56 PM 09/04/2020    4:08 PM 09/06/2019    3:08 PM 10/29/2018    3:51 PM  PHQ 2/9 Scores  PHQ - 2 Score 0 0 4 0 0 0 0  PHQ- 9 Score 0 0 9  1 0     Fall Risk    03/06/2022    4:03 PM 01/14/2022    4:02 PM 09/04/2021    1:21 PM 06/18/2021    3:04 PM 09/26/2020    2:00 PM  Fall Risk   Falls in the past  year? 0 0 0 0 0  Number falls in past yr: 0  0  0 0  Injury with Fall? 0 0 0 0 0  Risk for fall due to : No Fall Risks No Fall Risks  No Fall Risks No Fall Risks  Follow up Falls prevention discussed Falls evaluation completed  Falls evaluation completed Falls evaluation completed    [place]:  Any stairs in or around the home? Yes ; there are lights on each steps If so, are there any without handrails? No  Home free of loose throw rugs in walkways, pet beds, electrical cords, etc? Yes  Adequate lighting in your home to reduce risk of falls? Yes   ASSISTIVE DEVICES UTILIZED TO PREVENT FALLS:  Life alert? No  Use of a cane, walker or w/c? No  Grab bars in the bathroom? Yes  Shower chair or bench in shower? Yes  Elevated toilet seat or a handicapped toilet? No   TIMED UP AND GO:  Was the test performed? No . Phone Visit Only  Cognitive Function:        03/06/2022    4:10 PM  6CIT Screen  What Year? 0 points  What month? 0 points  What time? 0 points  Count back from 20 0 points  Months in reverse 0 points  Repeat phrase 0 points  Total Score 0 points    Immunizations Immunization History  Administered Date(s) Administered   Fluad Quad(high Dose 65+) 10/29/2018, 11/15/2019, 11/06/2021   H1N1 02/01/2008   Influenza Split 10/29/2011   Influenza Whole 12/08/2006, 11/15/2008, 10/25/2009   Influenza, High Dose Seasonal PF 11/09/2014, 12/09/2016, 10/27/2017, 12/19/2020   Influenza,inj,Quad PF,6+ Mos 10/29/2012, 11/04/2013   Influenza-Unspecified 11/25/2015   Moderna Covid-19 Vaccine Bivalent Booster 66yr & up 11/06/2021   Moderna Sars-Covid-2 Vaccination 04/02/2019, 04/30/2019, 12/14/2019, 05/29/2020   Pneumococcal Conjugate-13 10/29/2012   Pneumococcal Polysaccharide-23 12/20/1998, 12/20/2007   Tdap 08/08/2017   Zoster, Live 04/17/2013    TDAP status: Up to date  Flu Vaccine status: Up to date  Pneumococcal vaccine status: Up  to date  Covid-19 vaccine status: Completed vaccines  Qualifies for Shingles Vaccine? Yes   Zostavax completed Yes   Shingrix Completed?: No.    Education has been provided regarding the importance of this vaccine. Patient has been advised to call insurance company to determine out of pocket expense if they have not yet received this vaccine. Advised may also receive vaccine at local pharmacy or Health Dept. Verbalized acceptance and understanding.  Screening Tests Health Maintenance  Topic Date Due   Zoster Vaccines- Shingrix (1 of 2) Never done   COVID-19 Vaccine (6 - 2023-24 season) 01/01/2022   Medicare Annual Wellness (AWV)  03/07/2023   DTaP/Tdap/Td (2 - Td or Tdap) 08/09/2027   Pneumonia Vaccine 88 Years old  Completed   INFLUENZA VACCINE  Completed   DEXA SCAN  Completed   HPV VACCINES  Aged Out   MAMMOGRAM  Discontinued    Health Maintenance  Health Maintenance Due  Topic Date Due   Zoster Vaccines- Shingrix (1 of 2) Never done   COVID-19 Vaccine (6 - 2023-24 season) 01/01/2022    Colorectal cancer screening: Patient having some bowel issues; will speak with Dr. PAlain Marionat office visit scheduled for 03/07/2022.  Mammogram status: No longer required due to age.  Bone Density status: No longer recommended.  Lung Cancer Screening: (Low Dose CT Chest recommended if Age 86-80years, 30 pack-year currently smoking OR have quit w/in 15years.) does not qualify.   Lung Cancer Screening Referral: no  Additional Screening:  Hepatitis C Screening: does not qualify; Completed no  Vision Screening: Recommended annual ophthalmology exams for early detection of glaucoma and other disorders of the eye. Is the patient up to date with their annual eye exam?  Yes  Who is the provider or what is the name of the office in which the patient attends annual eye exams? Rutherford Guys, MD. If pt is not established with a provider, would they like to be referred to a provider to establish  care? No .   Dental Screening: Recommended annual dental exams for proper oral hygiene  Community Resource Referral / Chronic Care Management: CRR required this visit?  No   CCM required this visit?  No      Plan:     I have personally reviewed and noted the following in the patient's chart:   Medical and social history Use of alcohol, tobacco or illicit drugs  Current medications and supplements including opioid prescriptions. Patient is not currently taking opioid prescriptions. Functional ability and status Nutritional status Physical activity Advanced directives List of other physicians Hospitalizations, surgeries, and ER visits in previous 12 months Vitals Screenings to include cognitive, depression, and falls Referrals and appointments  In addition, I have reviewed and discussed with patient certain preventive protocols, quality metrics, and best practice recommendations. A written personalized care plan for preventive services as well as general preventive health recommendations were provided to patient.     Sheral Flow, LPN   [id number]   Nurse Notes: N/A   Medical screening examination/treatment/procedure(s) were performed by non-physician practitioner and as supervising physician I was immediately available for consultation/collaboration.  I agree with above. Lew Dawes, MD

## 2022-03-06 NOTE — Patient Instructions (Signed)
Whitney Benson , Thank you for taking time to come for your Medicare Wellness Visit. I appreciate your ongoing commitment to your health goals. Please review the following plan we discussed and let me know if I can assist you in the future.   These are the goals we discussed:  Goals       Patient Stated (pt-stated)      To maintain my current health status by continuing to eat healthy, stay physically active and socially active.        This is a list of the screening recommended for you and due dates:  Health Maintenance  Topic Date Due   Zoster (Shingles) Vaccine (1 of 2) Never done   COVID-19 Vaccine (6 - 2023-24 season) 01/01/2022   Medicare Annual Wellness Visit  03/07/2023   DTaP/Tdap/Td vaccine (2 - Td or Tdap) 08/09/2027   Pneumonia Vaccine  Completed   Flu Shot  Completed   DEXA scan (bone density measurement)  Completed   HPV Vaccine  Aged Out   Mammogram  Discontinued    Advanced directives: Yes  Conditions/risks identified: Yes  Next appointment: Follow up in one year for your annual wellness visit.   Preventive Care 62 Years and Older, Female Preventive care refers to lifestyle choices and visits with your health care provider that can promote health and wellness. What does preventive care include? A yearly physical exam. This is also called an annual well check. Dental exams once or twice a year. Routine eye exams. Ask your health care provider how often you should have your eyes checked. Personal lifestyle choices, including: Daily care of your teeth and gums. Regular physical activity. Eating a healthy diet. Avoiding tobacco and drug use. Limiting alcohol use. Practicing safe sex. Taking low-dose aspirin every day. Taking vitamin and mineral supplements as recommended by your health care provider. What happens during an annual well check? The services and screenings done by your health care provider during your annual well check will depend on your age,  overall health, lifestyle risk factors, and family history of disease. Counseling  Your health care provider may ask you questions about your: Alcohol use. Tobacco use. Drug use. Emotional well-being. Home and relationship well-being. Sexual activity. Eating habits. History of falls. Memory and ability to understand (cognition). Work and work Statistician. Reproductive health. Screening  You may have the following tests or measurements: Height, weight, and BMI. Blood pressure. Lipid and cholesterol levels. These may be checked every 5 years, or more frequently if you are over 74 years old. Skin check. Lung cancer screening. You may have this screening every year starting at age 86 if you have a 30-pack-year history of smoking and currently smoke or have quit within the past 15 years. Fecal occult blood test (FOBT) of the stool. You may have this test every year starting at age 86. Flexible sigmoidoscopy or colonoscopy. You may have a sigmoidoscopy every 5 years or a colonoscopy every 10 years starting at age 86. Hepatitis C blood test. Hepatitis B blood test. Sexually transmitted disease (STD) testing. Diabetes screening. This is done by checking your blood sugar (glucose) after you have not eaten for a while (fasting). You may have this done every 1-3 years. Bone density scan. This is done to screen for osteoporosis. You may have this done starting at age 86. Mammogram. This may be done every 1-2 years. Talk to your health care provider about how often you should have regular mammograms. Talk with your health care provider  about your test results, treatment options, and if necessary, the need for more tests. Vaccines  Your health care provider may recommend certain vaccines, such as: Influenza vaccine. This is recommended every year. Tetanus, diphtheria, and acellular pertussis (Tdap, Td) vaccine. You may need a Td booster every 10 years. Zoster vaccine. You may need this after age  86. Pneumococcal 13-valent conjugate (PCV13) vaccine. One dose is recommended after age 18. Pneumococcal polysaccharide (PPSV23) vaccine. One dose is recommended after age 22. Talk to your health care provider about which screenings and vaccines you need and how often you need them. This information is not intended to replace advice given to you by your health care provider. Make sure you discuss any questions you have with your health care provider. Document Released: 03/03/2015 Document Revised: 10/25/2015 Document Reviewed: 12/06/2014 Elsevier Interactive Patient Education  2017 [place] Prevention in the Home Falls can cause injuries. They can happen to people of all ages. There are many things you can do to make your home safe and to help prevent falls. What can I do on the outside of my home? Regularly fix the edges of walkways and driveways and fix any cracks. Remove anything that might make you trip as you walk through a door, such as a raised step or threshold. Trim any bushes or trees on the path to your home. Use bright outdoor lighting. Clear any walking paths of anything that might make someone trip, such as rocks or tools. Regularly check to see if handrails are loose or broken. Make sure that both sides of any steps have handrails. Any raised decks and porches should have guardrails on the edges. Have any leaves, snow, or ice cleared regularly. Use sand or salt on walking paths during winter. Clean up any spills in your garage right away. This includes oil or grease spills. What can I do in the bathroom? Use night lights. Install grab bars by the toilet and in the tub and shower. Do not use towel bars as grab bars. Use non-skid mats or decals in the tub or shower. If you need to sit down in the shower, use a plastic, non-slip stool. Keep the floor dry. Clean up any water that spills on the floor as soon as it happens. Remove soap buildup in the tub or shower  regularly. Attach bath mats securely with double-sided non-slip rug tape. Do not have throw rugs and other things on the floor that can make you trip. What can I do in the bedroom? Use night lights. Make sure that you have a light by your bed that is easy to reach. Do not use any sheets or blankets that are too big for your bed. They should not hang down onto the floor. Have a firm chair that has side arms. You can use this for support while you get dressed. Do not have throw rugs and other things on the floor that can make you trip. What can I do in the kitchen? Clean up any spills right away. Avoid walking on wet floors. Keep items that you use a lot in easy-to-reach places. If you need to reach something above you, use a strong step stool that has a grab bar. Keep electrical cords out of the way. Do not use floor polish or wax that makes floors slippery. If you must use wax, use non-skid floor wax. Do not have throw rugs and other things on the floor that can make you trip. What can I do  with my stairs? Do not leave any items on the stairs. Make sure that there are handrails on both sides of the stairs and use them. Fix handrails that are broken or loose. Make sure that handrails are as long as the stairways. Check any carpeting to make sure that it is firmly attached to the stairs. Fix any carpet that is loose or worn. Avoid having throw rugs at the top or bottom of the stairs. If you do have throw rugs, attach them to the floor with carpet tape. Make sure that you have a light switch at the top of the stairs and the bottom of the stairs. If you do not have them, ask someone to add them for you. What else can I do to help prevent falls? Wear shoes that: Do not have high heels. Have rubber bottoms. Are comfortable and fit you well. Are closed at the toe. Do not wear sandals. If you use a stepladder: Make sure that it is fully opened. Do not climb a closed stepladder. Make sure that  both sides of the stepladder are locked into place. Ask someone to hold it for you, if possible. Clearly mark and make sure that you can see: Any grab bars or handrails. First and last steps. Where the edge of each step is. Use tools that help you move around (mobility aids) if they are needed. These include: Canes. Walkers. Scooters. Crutches. Turn on the lights when you go into a dark area. Replace any light bulbs as soon as they burn out. Set up your furniture so you have a clear path. Avoid moving your furniture around. If any of your floors are uneven, fix them. If there are any pets around you, be aware of where they are. Review your medicines with your doctor. Some medicines can make you feel dizzy. This can increase your chance of falling. Ask your doctor what other things that you can do to help prevent falls. This information is not intended to replace advice given to you by your health care provider. Make sure you discuss any questions you have with your health care provider. Document Released: 12/01/2008 Document Revised: 07/13/2015 Document Reviewed: 03/11/2014 Elsevier Interactive Patient Education  2017 [employer].

## 2022-03-07 ENCOUNTER — Ambulatory Visit (INDEPENDENT_AMBULATORY_CARE_PROVIDER_SITE_OTHER): Payer: Medicare Other | Admitting: Internal Medicine

## 2022-03-07 ENCOUNTER — Encounter: Payer: Self-pay | Admitting: Internal Medicine

## 2022-03-07 VITALS — BP 150/80 | HR 57 | Temp 98.0°F | Ht 61.0 in | Wt 119.2 lb

## 2022-03-07 DIAGNOSIS — E785 Hyperlipidemia, unspecified: Secondary | ICD-10-CM | POA: Diagnosis not present

## 2022-03-07 DIAGNOSIS — R194 Change in bowel habit: Secondary | ICD-10-CM | POA: Diagnosis not present

## 2022-03-07 DIAGNOSIS — I739 Peripheral vascular disease, unspecified: Secondary | ICD-10-CM | POA: Diagnosis not present

## 2022-03-07 NOTE — Progress Notes (Signed)
Subjective:  Patient ID: Whitney Benson, female    DOB: 1934-02-20  Age: 88 y.o. MRN: [id number]  CC: Follow-up (For labs )   HPI KAFI KEZER presents for HTN, dyslipidemia C/o soft frequent stools x months F/u on stress - better Clair Gulling is here with her daughter Gerald Stabs  Outpatient Medications Prior to Visit  Medication Sig Dispense Refill   aspirin 81 MG tablet Take 81 mg by mouth daily.     atorvastatin (LIPITOR) 20 MG tablet Take 0.5 tablets (10 mg total) by mouth every other day. (Patient taking differently: Take 10 mg by mouth daily. Twice a week) 90 tablet 1   cholecalciferol (VITAMIN D) 1000 units tablet Take 1,000 Units by mouth daily.     COMIRNATY SUSP injection      diazepam (VALIUM) 5 MG tablet TAKE 1 TABLET BY MOUTH EVERY 6 HOURS AS NEEDED FOR ANXIETY DO NOT TAKE WITH ZOLPIDEM 30 tablet 3   FLUZONE HIGH-DOSE QUADRIVALENT 0.7 ML SUSY      Glucosamine Sulfate 500 MG TABS Take by oral route.     glucosamine-chondroitin 500-400 MG tablet Take 1 tablet by mouth daily.     metoprolol succinate (TOPROL-XL) 50 MG 24 hr tablet Take 1 tablet (50 mg total) by mouth in the morning and at bedtime. Take with or immediately following a meal. 180 tablet 3   MULTIPLE VITAMIN PO Take by mouth.     zolpidem (AMBIEN) 5 MG tablet TAKE 1 TABLET BY MOUTH DAILY AT BEDTIME AS NEEDED FOR SLEEP, can repeat in 4 hrs if needed 180 tablet 1   PARoxetine (PAXIL) 10 MG tablet TAKE 1 TABLET BY MOUTH DAILY 90 tablet 1   alendronate (FOSAMAX) 70 MG tablet Take 70 mg by mouth once a week. (Patient not taking: Reported on 03/06/2022)     azithromycin (ZITHROMAX Z-PAK) 250 MG tablet As directed (Patient not taking: Reported on 03/06/2022) 6 tablet 0   clotrimazole-betamethasone (LOTRISONE) cream APPLY TO THE AFFECTED AND SURROUNDING AREAS OF SKIN BY TOPICAL ROUTE 2 TIMES PER DAY IN THE MORNING AND EVENING FOR 2 WEEKS (Patient not taking: Reported on 03/06/2022)     diclofenac Sodium (VOLTAREN) 1 % GEL  (Patient not  taking: Reported on 03/06/2022)     ketoconazole (NIZORAL) 2 % cream APPLY TO THE AFFECTED AREA(S) BY TOPICAL ROUTE ONCE DAILY FOR TWO WEEKS (Patient not taking: Reported on 03/06/2022)     nitroGLYCERIN (NITROSTAT) 0.4 MG SL tablet Place 1 tablet (0.4 mg total) under the tongue every 5 (five) minutes as needed for chest pain. (Patient not taking: Reported on 01/14/2022) 20 tablet 1   pantoprazole (PROTONIX) 40 MG tablet Take 1 tablet (40 mg total) by mouth daily. (Patient not taking: Reported on 03/06/2022) 90 tablet 3   polyethylene glycol powder (GLYCOLAX/MIRALAX) 17 GM/SCOOP powder Take 17 g by mouth 2 (two) times daily as needed for mild constipation or moderate constipation. (Patient not taking: Reported on 01/14/2022) 850 g 3   terconazole (TERAZOL 3) 0.8 % vaginal cream Insert 1 applicatorful every day by vaginal route at dinner for 3 days. (Patient not taking: Reported on 03/06/2022)     triamcinolone ointment (KENALOG) 0.1 %  (Patient not taking: Reported on 03/06/2022)     valsartan-hydrochlorothiazide (DIOVAN-HCT) 320-25 MG tablet Take 1 tablet by mouth daily. (Patient not taking: Reported on 03/07/2022) 90 tablet 3   No facility-administered medications prior to visit.    ROS: Review of Systems  Constitutional:  Negative for activity change,  appetite change, chills, fatigue and unexpected weight change.  HENT:  Negative for congestion, mouth sores and sinus pressure.   Eyes:  Negative for visual disturbance.  Respiratory:  Negative for cough and chest tightness.   Gastrointestinal:  Negative for abdominal pain and nausea.  Genitourinary:  Negative for difficulty urinating, frequency and vaginal pain.  Musculoskeletal:  Negative for back pain and gait problem.  Skin:  Negative for pallor and rash.  Neurological:  Negative for dizziness, tremors, weakness, numbness and headaches.  Psychiatric/Behavioral:  Negative for confusion and sleep disturbance.     Objective:  BP (!) 150/80 (BP  Location: Left Arm, Patient Position: Sitting, Cuff Size: Normal)   Pulse (!) 57   Temp 98 F (36.7 C) (Oral)   Ht '5\' 1"'$  (1.549 m)   Wt 119 lb 3.2 oz (54.1 kg)   SpO2 98%   BMI 22.52 kg/m   BP Readings from Last 3 Encounters:  03/07/22 (!) 150/80  01/14/22 124/76  01/02/22 (!) 178/80    Wt Readings from Last 3 Encounters:  03/07/22 119 lb 3.2 oz (54.1 kg)  03/06/22 119 lb (54 kg)  01/14/22 114 lb (51.7 kg)    Physical Exam Constitutional:      General: She is not in acute distress.    Appearance: Normal appearance. She is well-developed.  HENT:     Head: Normocephalic.     Right Ear: External ear normal.     Left Ear: External ear normal.     Nose: Nose normal.  Eyes:     General:        Right eye: No discharge.        Left eye: No discharge.     Conjunctiva/sclera: Conjunctivae normal.     Pupils: Pupils are equal, round, and reactive to light.  Neck:     Thyroid: No thyromegaly.     Vascular: No JVD.     Trachea: No tracheal deviation.  Cardiovascular:     Rate and Rhythm: Normal rate and regular rhythm.     Heart sounds: Normal heart sounds.  Pulmonary:     Effort: No respiratory distress.     Breath sounds: No stridor. No wheezing.  Abdominal:     General: Bowel sounds are normal. There is no distension.     Palpations: Abdomen is soft. There is no mass.     Tenderness: There is no abdominal tenderness. There is no guarding or rebound.  Musculoskeletal:        General: No tenderness.     Cervical back: Normal range of motion and neck supple. No rigidity.     Right lower leg: No edema.     Left lower leg: No edema.  Lymphadenopathy:     Cervical: No cervical adenopathy.  Skin:    Findings: No erythema or rash.  Neurological:     Mental Status: She is oriented to person, place, and time.     Cranial Nerves: No cranial nerve deficit.     Motor: No abnormal muscle tone.     Coordination: Coordination normal.     Deep Tendon Reflexes: Reflexes normal.   Psychiatric:        Behavior: Behavior normal.        Thought Content: Thought content normal.        Judgment: Judgment normal.     Lab Results  Component Value Date   WBC 6.8 03/07/2022   HGB 12.9 03/07/2022   HCT 37.8 03/07/2022   PLT 283.0  03/07/2022   GLUCOSE 98 03/07/2022   CHOL 186 03/07/2022   TRIG 182.0 (H) 03/07/2022   HDL 57.60 03/07/2022   LDLDIRECT 84.1 07/20/2012   LDLCALC 92 03/07/2022   ALT 11 03/07/2022   AST 19 03/07/2022   NA 133 (L) 03/07/2022   K 3.8 03/07/2022   CL 93 (L) 03/07/2022   CREATININE 0.80 03/07/2022   BUN 20 03/07/2022   CO2 31 03/07/2022   TSH 1.12 03/07/2022    CT Head Wo Contrast  Result Date: 01/02/2022 CLINICAL DATA:  New onset headache EXAM: CT HEAD WITHOUT CONTRAST TECHNIQUE: Contiguous axial images were obtained from the base of the skull through the vertex without intravenous contrast. RADIATION DOSE REDUCTION: This exam was performed according to the departmental dose-optimization program which includes automated exposure control, adjustment of the mA and/or kV according to patient size and/or use of iterative reconstruction technique. COMPARISON:  Head CT December 01, 2017 FINDINGS: Brain: Mild diffuse age-related global parenchymal volume loss. No evidence of acute infarction, hemorrhage, hydrocephalus, extra-axial collection or mass lesion/mass effect. Vascular: No hyperdense vessel or unexpected calcification. Skull: Normal. Negative for fracture or focal lesion. Sinuses/Orbits: Visualized portions of the paranasal sinuses and ethmoid air cells are predominantly clear. Orbits are grossly unremarkable. Other: Mastoid air cells are predominantly clear. IMPRESSION: No acute intracranial abnormality. Electronically Signed   By: Dahlia Bailiff M.D.   On: 01/02/2022 12:55    Assessment & Plan:   Problem List Items Addressed This Visit       Other   Dyslipidemia - Primary    On Lipitor      Relevant Orders   Comprehensive  metabolic panel (Completed)   TSH (Completed)   Lipid panel (Completed)   CBC with Differential/Platelet (Completed)   Urinalysis (Completed)   Claudication (HCC)    Better on Lipitor 1 tab q 3 days      Relevant Orders   Comprehensive metabolic panel (Completed)   TSH (Completed)   Lipid panel (Completed)   CBC with Differential/Platelet (Completed)   Urinalysis (Completed)   Bowel habit changes    New GI ref - Dr Carlean Purl      Relevant Orders   Ambulatory referral to Gastroenterology   Comprehensive metabolic panel (Completed)   TSH (Completed)   Lipid panel (Completed)   CBC with Differential/Platelet (Completed)   Urinalysis (Completed)      No orders of the defined types were placed in this encounter.     Follow-up: Return in about 6 months (around 09/05/2022) for a follow-up visit.  Walker Kehr, MD

## 2022-03-07 NOTE — Assessment & Plan Note (Signed)
New GI ref - Dr Carlean Purl

## 2022-03-07 NOTE — Assessment & Plan Note (Signed)
On Lipitor 

## 2022-03-07 NOTE — Assessment & Plan Note (Signed)
Better on Lipitor 1 tab q 3 days

## 2022-03-08 LAB — CBC WITH DIFFERENTIAL/PLATELET
Basophils Absolute: 0.1 10*3/uL (ref 0.0–0.1)
Basophils Relative: 1 % (ref 0.0–3.0)
Eosinophils Absolute: 0.2 10*3/uL (ref 0.0–0.7)
Eosinophils Relative: 2.7 % (ref 0.0–5.0)
HCT: 37.8 % (ref 36.0–46.0)
Hemoglobin: 12.9 g/dL (ref 12.0–15.0)
Lymphocytes Relative: 37.8 % (ref 12.0–46.0)
Lymphs Abs: 2.6 10*3/uL (ref 0.7–4.0)
MCHC: 34.2 g/dL (ref 30.0–36.0)
MCV: 92.2 fl (ref 78.0–100.0)
Monocytes Absolute: 0.8 10*3/uL (ref 0.1–1.0)
Monocytes Relative: 11 % (ref 3.0–12.0)
Neutro Abs: 3.3 10*3/uL (ref 1.4–7.7)
Neutrophils Relative %: 47.5 % (ref 43.0–77.0)
Platelets: 283 10*3/uL (ref 150.0–400.0)
RBC: 4.1 Mil/uL (ref 3.87–5.11)
RDW: 12.9 % (ref 11.5–15.5)
WBC: 6.8 10*3/uL (ref 4.0–10.5)

## 2022-03-08 LAB — COMPREHENSIVE METABOLIC PANEL
ALT: 11 U/L (ref 0–35)
AST: 19 U/L (ref 0–37)
Albumin: 4.3 g/dL (ref 3.5–5.2)
Alkaline Phosphatase: 41 U/L (ref 39–117)
BUN: 20 mg/dL (ref 6–23)
CO2: 31 mEq/L (ref 19–32)
Calcium: 9.5 mg/dL (ref 8.4–10.5)
Chloride: 93 mEq/L — ABNORMAL LOW (ref 96–112)
Creatinine, Ser: 0.8 mg/dL (ref 0.40–1.20)
GFR: 67.03 mL/min (ref >60.00)
Glucose, Bld: 98 mg/dL (ref 70–99)
Potassium: 3.8 mEq/L (ref 3.5–5.1)
Sodium: 133 mEq/L — ABNORMAL LOW (ref 135–145)
Total Bilirubin: 0.4 mg/dL (ref 0.2–1.2)
Total Protein: 7.2 g/dL (ref 6.0–8.3)

## 2022-03-08 LAB — TSH: TSH: 1.12 u[IU]/mL (ref 0.35–5.50)

## 2022-03-08 LAB — URINALYSIS
Bilirubin Urine: NEGATIVE
Hgb urine dipstick: NEGATIVE
Ketones, ur: NEGATIVE
Leukocytes,Ua: NEGATIVE
Nitrite: NEGATIVE
Specific Gravity, Urine: 1.015 (ref 1.000–1.030)
Total Protein, Urine: NEGATIVE
Urine Glucose: NEGATIVE
Urobilinogen, UA: 0.2 (ref 0.0–1.0)
pH: 7 (ref 5.0–8.0)

## 2022-03-08 LAB — LIPID PANEL
Cholesterol: 186 mg/dL (ref 0–200)
HDL: 57.6 mg/dL (ref >39.00)
LDL Cholesterol: 92 mg/dL (ref 0–99)
NonHDL: 128.34
Total CHOL/HDL Ratio: 3
Triglycerides: 182 mg/dL — ABNORMAL HIGH (ref 0.0–149.0)
VLDL: 36.4 mg/dL (ref 0.0–40.0)

## 2022-03-13 ENCOUNTER — Telehealth: Payer: Self-pay | Admitting: *Deleted

## 2022-03-13 NOTE — Telephone Encounter (Signed)
Missed and closed lab notes.. but pt is aware of labs. Pt states MD was going to refer her to see Gastroenterologist. Inform pt referral was place on 03/07/22. Referral is still pending will send PCP msg to scheduled.Marland KitchenJohny Chess

## 2022-03-14 ENCOUNTER — Encounter: Payer: Self-pay | Admitting: Internal Medicine

## 2022-03-14 NOTE — Telephone Encounter (Signed)
Per [facility] referral has been done. Pt doesn't have MyChart so they had to mail letter to pt for her to contact Dr. Carlean Purl. I called pt told her referral was placed and for her to contact Dr.Gessner office to make appt.Marland KitchenJohny Benson

## 2022-04-16 ENCOUNTER — Other Ambulatory Visit: Payer: Self-pay | Admitting: Internal Medicine

## 2022-04-28 ENCOUNTER — Encounter: Payer: Self-pay | Admitting: Internal Medicine

## 2022-04-28 NOTE — Progress Notes (Signed)
Missed appt -the patient was not able to connect

## 2022-05-21 ENCOUNTER — Encounter: Payer: Self-pay | Admitting: Internal Medicine

## 2022-05-21 ENCOUNTER — Ambulatory Visit: Payer: Medicare Other | Admitting: Internal Medicine

## 2022-05-21 VITALS — BP 180/70 | HR 60 | Ht 59.75 in | Wt 123.2 lb

## 2022-05-21 DIAGNOSIS — Z800 Family history of malignant neoplasm of digestive organs: Secondary | ICD-10-CM | POA: Diagnosis not present

## 2022-05-21 DIAGNOSIS — R194 Change in bowel habit: Secondary | ICD-10-CM | POA: Diagnosis not present

## 2022-05-21 NOTE — Progress Notes (Signed)
Whitney Benson 88 y.o. 01/16/1935 [id number]  Assessment & Plan:   Encounter Diagnoses  Name Primary?   Change in bowel habits Yes   Family history of colon cancer    Schedule colonoscopy to evaluate. The risks and benefits as well as alternatives of endoscopic procedure(s) have been discussed and reviewed. All questions answered. The patient agrees to proceed.   Further plans pending the results.  I suspect this is symptomatic diverticulosis plus or minus IBS-like problems.  Want to exclude colorectal malignancy.  Since she does not like Metamucil perhaps soluble fiber with Benefiber supplement would be more beneficial.  Lower dose of MiraLAX possibly.  Will make that decision after colonoscopy.    Subjective:   Chief Complaint: Altered bowel habits  HPI 88 year old white woman with a history of hypertension, anxiety, remote history of colon polyps and family history of colon cancer who presents with her son because of altered bowel habits.  For some time now she has been having variable stool consistently with pellets and then sometimes very loose stools.  She has not had any bleeding.  There has been some abdominal pain off and on.  A CT scan in 06/01/18 did not reveal diverticulitis but showed diverticulosis.  Last colonoscopy summarized below.  That was in May 31, 2009.  She is still very active though she does not drive, she gardens and does housework etc.  Her youngest son lives with her, her husband passed away in 31-May-2017.  She has had some weight loss at times though that has recovered.  She is not describing dysphagia or early satiety or vomiting issues.  Occasional heartburn and increased gas she says.  She has used Metamucil remotely but does not like it.  She had tried MiraLAX but it sounds like maybe they gave her too many stools and diarrhea so it was stopped.   Wt Readings from Last 3 Encounters:  05/21/22 123 lb 4 oz (55.9 kg)  03/07/22 119 lb 3.2 oz (54.1 kg)  03/06/22 119 lb (54  kg)   Lab Results  Component Value Date   TSH 1.12 03/07/2022     Chemistry      Component Value Date/Time   NA 133 (L) 03/07/2022 1632   K 3.8 03/07/2022 1632   CL 93 (L) 03/07/2022 1632   CO2 31 03/07/2022 1632   BUN 20 03/07/2022 1632   CREATININE 0.80 03/07/2022 1632      Component Value Date/Time   CALCIUM 9.5 03/07/2022 1632   ALKPHOS 41 03/07/2022 1632   AST 19 03/07/2022 1632   ALT 11 03/07/2022 1632   BILITOT 0.4 03/07/2022 1632     Lab Results  Component Value Date   WBC 6.8 03/07/2022   HGB 12.9 03/07/2022   HCT 37.8 03/07/2022   MCV 92.2 03/07/2022   PLT 283.0 03/07/2022     CT abdomen pelvis with contrast 11/18/2018 IMPRESSION: 1. No acute findings in the abdomen or pelvis. 2. Sigmoid diverticulosis without evidence diverticulitis. 3. Degenerative endplate change in the lumbar spine. 4. Potential LEFT lower lobe pulmonary nodule versus vascular structure. Recommend CT of the thorax to further evaluate Colonoscopy 01/10/2010 1) Diminutive polyp in the ascending colon-adenoma     2) Mild diverticulosis in the sigmoid colon     3) Otherwise normal examination     4) Family history of colon cancer (father deceased at 42)  Allergies  Allergen Reactions   Hydrocodone     n/v Able to use prom-cod syr  Current Meds  Medication Sig   aspirin 81 MG tablet Take 81 mg by mouth daily.   atorvastatin (LIPITOR) 20 MG tablet Take 0.5 tablets (10 mg total) by mouth every other day. (Patient taking differently: Take 10 mg by mouth once a week.)   cholecalciferol (VITAMIN D) 1000 units tablet Take 1,000 Units by mouth daily.   diazepam (VALIUM) 5 MG tablet TAKE 1 TABLET BY MOUTH EVERY 6 HOURS AS NEEDED FOR ANXIETY DO NOT TAKE WITH ZOLPIDEM   FLUZONE HIGH-DOSE QUADRIVALENT 0.7 ML SUSY    Glucosamine Sulfate 500 MG TABS Take by oral route.   glucosamine-chondroitin 500-400 MG tablet Take 1 tablet by mouth daily.   metoprolol succinate (TOPROL-XL) 50 MG 24 hr  tablet Take 1 tablet (50 mg total) by mouth in the morning and at bedtime. Take with or immediately following a meal.   MULTIPLE VITAMIN PO Take by mouth.   PARoxetine (PAXIL) 10 MG tablet TAKE 1 TABLET BY MOUTH DAILY   zolpidem (AMBIEN) 5 MG tablet TAKE 1 TABLET BY MOUTH DAILY AT BEDTIME AS NEEDED FOR SLEEP, can repeat in 4 hrs if needed   Past Medical History:  Diagnosis Date   Anxiety    Arthritis    DJD (degenerative joint disease)    History of asthma    History of colon polyps    Hyperlipidemia    Hypertension    Insomnia    Past Surgical History:  Procedure Laterality Date   DILATION AND CURETTAGE OF UTERUS     OTHER SURGICAL HISTORY  09/19/2011   mammogram and bone density   POLYPECTOMY     sun spot     from back   TUBAL LIGATION     Social History   Social History Narrative   HSG   Married '61   2 sons- '69, '78, 2 daughters- '63, '65, 4 grandchildren   Worked- Magazine features editor, Medical sales representative work, homemaker   family history includes Anxiety disorder in her daughter, daughter, and son; Arthritis in her daughter, daughter, and son; Cancer in her mother; Colon cancer (age of onset: 63) in her father; Hyperlipidemia in her son; Hypertension in her mother and son; Stroke in her maternal grandmother and paternal grandmother.   Review of Systems As per HPI.  Decreased hearing, some insomnia arthritis and back pain and anxiety.  All other review of systems are negative.  Objective:   Physical Exam @BP  (!) 174/70 (BP Location: Left Arm, Patient Position: Sitting, Cuff Size: Normal)   Pulse 60   Ht 4' 11.75" (1.518 m) Comment: height measurred without shoes  Wt 123 lb 4 oz (55.9 kg)   BMI 24.27 kg/m @  General:  Well-developed, well-nourished and in no acute distress Eyes:  anicteric.  Lungs: Clear to auscultation bilaterally. Heart:  S1S2, no rubs, murmurs, gallops. Abdomen:  soft, non-tender, no hepatosplenomegaly, hernia, or mass and BS+.  Rectal: Whitney Benson, Lipan  present.  Anoderm inspection revealed no abnormalities Digital exam revealed normal resting tone and voluntary squeeze. No mass or rectocele present. Simulated defecation with valsalva revealed appropriate abdominal contraction and descent.   Skin   no rash. Neuro:  A&O x 3.  Psych:  appropriate mood and  Affect.   Data Reviewed: See HPI

## 2022-05-21 NOTE — Patient Instructions (Signed)
You have been scheduled for a colonoscopy. Please follow written instructions given to you at your visit today.  Please pick up your prep supplies at the pharmacy within the next 1-3 days. If you use inhalers (even only as needed), please bring them with you on the day of your procedure.  _______________________________________________________  If your blood pressure at your visit was 140/90 or greater, please contact your primary care physician to follow up on this.  _______________________________________________________  If you are age 86 or older, your body mass index should be between 23-30. Your Body mass index is 24.27 kg/m. If this is out of the aforementioned range listed, please consider follow up with your Primary Care Provider.  If you are age 57 or younger, your body mass index should be between 19-25. Your Body mass index is 24.27 kg/m. If this is out of the aformentioned range listed, please consider follow up with your Primary Care Provider.   ________________________________________________________  The [facility] providers would like to encourage you to use [facility] to communicate with providers for non-urgent requests or questions.  Due to long hold times on the telephone, sending your provider a message by [facility] may be a faster and more efficient way to get a response.  Please allow 48 business hours for a response.  Please remember that this is for non-urgent requests.  _______________________________________________________   I appreciate the opportunity to care for you. Silvano Rusk, MD, [facility]

## 2022-05-27 ENCOUNTER — Ambulatory Visit (AMBULATORY_SURGERY_CENTER): Payer: Medicare Other | Admitting: Internal Medicine

## 2022-05-27 ENCOUNTER — Encounter: Payer: Self-pay | Admitting: Internal Medicine

## 2022-05-27 VITALS — BP 174/76 | HR 55 | Temp 97.1°F | Resp 13 | Ht 59.75 in | Wt 123.0 lb

## 2022-05-27 DIAGNOSIS — D123 Benign neoplasm of transverse colon: Secondary | ICD-10-CM | POA: Diagnosis not present

## 2022-05-27 DIAGNOSIS — R194 Change in bowel habit: Secondary | ICD-10-CM

## 2022-05-27 MED ORDER — SODIUM CHLORIDE 0.9 % IV SOLN: 500.0000 mL | Freq: Once | INTRAVENOUS | Status: DC

## 2022-05-27 NOTE — Progress Notes (Unsigned)
To pacu, VSS. Report to [email or web address] 

## 2022-05-27 NOTE — Progress Notes (Signed)
Called to room to assist during endoscopic procedure.  Patient ID and intended procedure confirmed with present staff. Received instructions for my participation in the procedure from the performing physician.  

## 2022-05-27 NOTE — Progress Notes (Signed)
History and Physical Interval Note:  05/27/2022 2:08 PM  Whitney Benson  has presented today for endoscopic procedure(s), with the diagnosis of  Encounter Diagnosis  Name Primary?   Change in bowel habits Yes  .  The various methods of evaluation and treatment have been discussed with the patient and/or family. After consideration of risks, benefits and other options for treatment, the patient has consented to  the endoscopic procedure(s).   The patient's history has been reviewed, patient examined, no change in status, stable for endoscopic procedure(s).  I have reviewed the patient's chart and labs.  Questions were answered to the patient's satisfaction.     Iva Boop, MD, [facility]

## 2022-05-27 NOTE — Patient Instructions (Addendum)
I found and removed one tiny polyp that looks benign. You do have diverticulosis - thickened muscle rings and pouches in the colon wall. I think probably causing constipation issues. Please read the handout about this condition. Your hemorrhoids were slightly swollen, as well.  Please start taking 1 tablespoon of Benefiber daily. Prunes and Kiwi fruit in diet will help also.  I appreciate the opportunity to care for you. Iva Boop, MD, [facility]  Recommendation: Patient has a contact number available for                            emergencies. The signs and symptoms of potential                            delayed complications were discussed with the                            patient. Return to normal activities tomorrow.                            Written discharge instructions were provided to the                            patient.                           - Resume previous diet.                           - Continue present medications.                           - Await pathology results.                           - No repeat colonoscopy due to age.                           - Take 1 tablespoon Benefiber daily                           May add prunes and/or Kiwi fruit to diet   Handouts on polyps, hemorrhoids and diverticulosis given.  YOU HAD AN ENDOSCOPIC PROCEDURE TODAY AT THE [facility]:   Refer to the procedure report that was given to you for any specific questions about what was found during the examination.  If the procedure report does not answer your questions, please call your gastroenterologist to clarify.  If you requested that your care partner not be given the details of your procedure findings, then the procedure report has been included in a sealed envelope for you to review at your convenience later.  YOU SHOULD EXPECT: Some feelings of bloating in the abdomen. Passage of more gas than usual.  Walking can help get rid of the air that was put into your  GI tract during the procedure and reduce the bloating. If you had a lower endoscopy (such as a colonoscopy or flexible sigmoidoscopy) you may notice spotting of blood in your stool or on the toilet paper.  If you underwent a bowel prep for your procedure, you may not have a normal bowel movement for a few days.  Please Note:  You might notice some irritation and congestion in your nose or some drainage.  This is from the oxygen used during your procedure.  There is no need for concern and it should clear up in a day or so.  SYMPTOMS TO REPORT IMMEDIATELY:  Following lower endoscopy (colonoscopy or flexible sigmoidoscopy):  Excessive amounts of blood in the stool  Significant tenderness or worsening of abdominal pains  Swelling of the abdomen that is new, acute  Fever of 100F or higher  Following upper endoscopy (EGD)  Vomiting of blood or coffee ground material  New chest pain or pain under the shoulder blades  Painful or persistently difficult swallowing  New shortness of breath  Fever of 100F or higher  Black, tarry-looking stools  For urgent or emergent issues, a gastroenterologist can be reached at any hour by calling [phone]. Do not use MyChart messaging for urgent concerns.    DIET:  We do recommend a small meal at first, but then you may proceed to your regular diet.  Drink plenty of fluids but you should avoid alcoholic beverages for 24 hours.  ACTIVITY:  You should plan to take it easy for the rest of today and you should NOT DRIVE or use heavy machinery until tomorrow (because of the sedation medicines used during the test).    FOLLOW UP: Our staff will call the number listed on your records the next business day following your procedure.  We will call around 7:15- 8:00 am to check on you and address any questions or concerns that you may have regarding the information given to you following your procedure. If we do not reach you, we will leave a message.     If any  biopsies were taken you will be contacted by phone or by letter within the next 1-3 weeks.  Please call us at [phone] if you have not heard about the biopsies in 3 weeks.    SIGNATURES/CONFIDENTIALITY: You and/or your care partner have signed paperwork which will be entered into your electronic medical record.  These signatures attest to the fact that that the information above on your After Visit Summary has been reviewed and is understood.  Full responsibility of the confidentiality of this discharge information lies with you and/or your care-partner.

## 2022-05-27 NOTE — Progress Notes (Unsigned)
VS by DT  Pt's states no medical or surgical changes since previsit or office visit.  

## 2022-05-27 NOTE — Op Note (Signed)
[facility] Patient Name: Whitney Benson Procedure Date: 05/27/2022 2:03 PM MRN: [id number] Endoscopist: Iva Boop , MD, [id number] Age: 88 Referring MD:  Date of Birth: 11/02/1934 Gender: Female Account #: [id number] Procedure:                Colonoscopy Indications:              Change in bowel habits Medicines:                Monitored Anesthesia Care Procedure:                Pre-Anesthesia Assessment:                           - Prior to the procedure, a History and Physical                            was performed, and patient medications and                            allergies were reviewed. The patient's tolerance of                            previous anesthesia was also reviewed. The risks                            and benefits of the procedure and the sedation                            options and risks were discussed with the patient.                            All questions were answered, and informed consent                            was obtained. Prior Anticoagulants: The patient has                            taken no anticoagulant or antiplatelet agents. ASA                            Grade Assessment: II - A patient with mild systemic                            disease. After reviewing the risks and benefits,                            the patient was deemed in satisfactory condition to                            undergo the procedure.                           After obtaining informed consent, the colonoscope  was passed under direct vision. Throughout the                            procedure, the patient's blood pressure, pulse, and                            oxygen saturations were monitored continuously. The                            CF [id number] #[id number] was introduced through the anus                            and advanced to the the cecum, identified by                            appendiceal orifice and ileocecal valve.  The                            colonoscopy was performed without difficulty. The                            patient tolerated the procedure well. The quality                            of the bowel preparation was good. The ileocecal                            valve, appendiceal orifice, and rectum were                            photographed. The bowel preparation used was                            Miralax via split dose instruction. Scope In: 2:16:20 PM Scope Out: 2:29:50 PM Scope Withdrawal Time: 0 hours 9 minutes 16 seconds  Total Procedure Duration: 0 hours 13 minutes 30 seconds  Findings:                 The perianal and digital rectal examinations were                            normal.                           A diminutive polyp was found in the transverse                            colon. The polyp was flat. The polyp was removed                            with a cold snare. Resection and retrieval were                            complete. Verification of patient identification  for the specimen was done. Estimated blood loss was                            minimal.                           Multiple diverticula were found in the sigmoid                            colon. There was narrowing of the colon in                            association with the diverticular opening.                           External hemorrhoids were found.                           The exam was otherwise without abnormality on                            direct and retroflexion views. Complications:            No immediate complications. Estimated Blood Loss:     Estimated blood loss was minimal. Impression:               - One diminutive polyp in the transverse colon,                            removed with a cold snare. Resected and retrieved.                           - Severe diverticulosis in the sigmoid colon. There                            was narrowing of the colon  in association with the                            diverticular opening.                           - External hemorrhoids.                           - The examination was otherwise normal on direct                            and retroflexion views. Recommendation:           - Patient has a contact number available for                            emergencies. The signs and symptoms of potential                            delayed complications were discussed with the  patient. Return to normal activities tomorrow.                            Written discharge instructions were provided to the                            patient.                           - Resume previous diet.                           - Continue present medications.                           - Await pathology results.                           - No repeat colonoscopy due to age.                           - Take 1 tablespoon Benefiber daily                           May add prunes and/or Kiwi fruit to diet Iva Boop, MD 05/27/2022 2:39:26 PM This report has been signed electronically.

## 2022-05-28 ENCOUNTER — Telehealth: Payer: Self-pay | Admitting: *Deleted

## 2022-05-28 DIAGNOSIS — D485 Neoplasm of uncertain behavior of skin: Secondary | ICD-10-CM | POA: Diagnosis not present

## 2022-05-28 DIAGNOSIS — L821 Other seborrheic keratosis: Secondary | ICD-10-CM | POA: Diagnosis not present

## 2022-05-28 DIAGNOSIS — D225 Melanocytic nevi of trunk: Secondary | ICD-10-CM | POA: Diagnosis not present

## 2022-05-28 DIAGNOSIS — D0471 Carcinoma in situ of skin of right lower limb, including hip: Secondary | ICD-10-CM | POA: Diagnosis not present

## 2022-05-28 DIAGNOSIS — L570 Actinic keratosis: Secondary | ICD-10-CM | POA: Diagnosis not present

## 2022-05-28 DIAGNOSIS — D2272 Melanocytic nevi of left lower limb, including hip: Secondary | ICD-10-CM | POA: Diagnosis not present

## 2022-05-28 DIAGNOSIS — D2271 Melanocytic nevi of right lower limb, including hip: Secondary | ICD-10-CM | POA: Diagnosis not present

## 2022-05-28 NOTE — Telephone Encounter (Signed)
  Follow up Call-     05/27/2022    1:19 PM  Call back number  Post procedure Call Back phone  # [phone]  Permission to leave phone message Yes     Patient questions:  Do you have a fever, pain , or abdominal swelling? No. Pain Score  0 *  Have you tolerated food without any problems? Yes.    Have you been able to return to your normal activities? yes  Do you have any questions about your discharge instructions: Diet   No. Medications  No. Follow up visit  No.  Do you have questions or concerns about your Care? No.  Actions: * If pain score is 4 or above: No action needed, pain <4.

## 2022-05-31 ENCOUNTER — Other Ambulatory Visit: Payer: Self-pay | Admitting: *Deleted

## 2022-05-31 MED ORDER — PANTOPRAZOLE SODIUM 40 MG PO TBEC: 40.0000 mg | DELAYED_RELEASE_TABLET | Freq: Every day | ORAL | 2 refills | Status: DC

## 2022-06-06 ENCOUNTER — Encounter: Payer: Self-pay | Admitting: Internal Medicine

## 2022-06-10 ENCOUNTER — Other Ambulatory Visit: Payer: Self-pay | Admitting: Internal Medicine

## 2022-07-25 ENCOUNTER — Other Ambulatory Visit: Payer: Self-pay | Admitting: Internal Medicine

## 2022-09-02 ENCOUNTER — Other Ambulatory Visit: Payer: Self-pay | Admitting: Internal Medicine

## 2022-11-28 ENCOUNTER — Ambulatory Visit (INDEPENDENT_AMBULATORY_CARE_PROVIDER_SITE_OTHER): Payer: Medicare Other | Admitting: Internal Medicine

## 2022-11-28 ENCOUNTER — Encounter: Payer: Self-pay | Admitting: Internal Medicine

## 2022-11-28 VITALS — BP 180/78 | HR 56 | Temp 98.2°F | Ht 59.5 in | Wt 118.0 lb

## 2022-11-28 DIAGNOSIS — E785 Hyperlipidemia, unspecified: Secondary | ICD-10-CM

## 2022-11-28 DIAGNOSIS — F418 Other specified anxiety disorders: Secondary | ICD-10-CM

## 2022-11-28 DIAGNOSIS — G4700 Insomnia, unspecified: Secondary | ICD-10-CM | POA: Diagnosis not present

## 2022-11-28 DIAGNOSIS — I1A0 Resistant hypertension: Secondary | ICD-10-CM | POA: Diagnosis not present

## 2022-11-28 LAB — URINALYSIS
Bilirubin Urine: NEGATIVE
Ketones, ur: NEGATIVE
Leukocytes,Ua: NEGATIVE
Nitrite: NEGATIVE
Specific Gravity, Urine: 1.01 (ref 1.000–1.030)
Total Protein, Urine: NEGATIVE
Urine Glucose: NEGATIVE
Urobilinogen, UA: 0.2 (ref 0.0–1.0)
pH: 7 (ref 5.0–8.0)

## 2022-11-28 LAB — CBC WITH DIFFERENTIAL/PLATELET
Basophils Absolute: 0 10*3/uL (ref 0.0–0.1)
Basophils Relative: 0.7 % (ref 0.0–3.0)
Eosinophils Absolute: 0.1 10*3/uL (ref 0.0–0.7)
Eosinophils Relative: 1.4 % (ref 0.0–5.0)
HCT: 39.9 % (ref 36.0–46.0)
Hemoglobin: 13.3 g/dL (ref 12.0–15.0)
Lymphocytes Relative: 28.7 % (ref 12.0–46.0)
Lymphs Abs: 2.1 10*3/uL (ref 0.7–4.0)
MCHC: 33.5 g/dL (ref 30.0–36.0)
MCV: 94.9 fL (ref 78.0–100.0)
Monocytes Absolute: 0.5 10*3/uL (ref 0.1–1.0)
Monocytes Relative: 7.1 % (ref 3.0–12.0)
Neutro Abs: 4.6 10*3/uL (ref 1.4–7.7)
Neutrophils Relative %: 62.1 % (ref 43.0–77.0)
Platelets: 283 10*3/uL (ref 150.0–400.0)
RBC: 4.2 Mil/uL (ref 3.87–5.11)
RDW: 12.6 % (ref 11.5–15.5)
WBC: 7.4 10*3/uL (ref 4.0–10.5)

## 2022-11-28 LAB — LIPID PANEL
Cholesterol: 192 mg/dL (ref 0–200)
HDL: 68.3 mg/dL (ref >39.00)
LDL Cholesterol: 94 mg/dL (ref 0–99)
NonHDL: 124.01
Total CHOL/HDL Ratio: 3
Triglycerides: 148 mg/dL (ref 0.0–149.0)
VLDL: 29.6 mg/dL (ref 0.0–40.0)

## 2022-11-28 LAB — COMPREHENSIVE METABOLIC PANEL
ALT: 12 U/L (ref 0–35)
AST: 18 U/L (ref 0–37)
Albumin: 4.3 g/dL (ref 3.5–5.2)
Alkaline Phosphatase: 43 U/L (ref 39–117)
BUN: 18 mg/dL (ref 6–23)
CO2: 30 meq/L (ref 19–32)
Calcium: 9.9 mg/dL (ref 8.4–10.5)
Chloride: 94 meq/L — ABNORMAL LOW (ref 96–112)
Creatinine, Ser: 0.78 mg/dL (ref 0.40–1.20)
GFR: 68.74 mL/min (ref >60.00)
Glucose, Bld: 104 mg/dL — ABNORMAL HIGH (ref 70–99)
Potassium: 4 meq/L (ref 3.5–5.1)
Sodium: 132 meq/L — ABNORMAL LOW (ref 135–145)
Total Bilirubin: 0.6 mg/dL (ref 0.2–1.2)
Total Protein: 6.7 g/dL (ref 6.0–8.3)

## 2022-11-28 LAB — TSH: TSH: 1.33 u[IU]/mL (ref 0.35–5.50)

## 2022-11-28 MED ORDER — PANTOPRAZOLE SODIUM 40 MG PO TBEC: 40.0000 mg | DELAYED_RELEASE_TABLET | Freq: Every day | ORAL | 3 refills | Status: DC

## 2022-11-28 MED ORDER — AMLODIPINE BESYLATE 5 MG PO TABS: 5.0000 mg | ORAL_TABLET | Freq: Every day | ORAL | 11 refills | Status: DC

## 2022-11-28 MED ORDER — DIAZEPAM 5 MG PO TABS: ORAL_TABLET | ORAL | 3 refills | Status: DC

## 2022-11-28 NOTE — Assessment & Plan Note (Signed)
Continue w/Metoprolol -  50 mg bid, Valsartan HCT 320/25 qd. Cut back on salt Added Amlodipine 5 mg/d

## 2022-11-28 NOTE — Progress Notes (Signed)
Subjective:  Patient ID: Whitney Benson, female    DOB: [birth year]  Age: 88 y.o. MRN: [id number]  CC: Follow-up (6 MNTH F/U, BP concerns)   HPI DAPHYNE MIGUEZ presents for HTN: SBP 150-200 at home F/u dyslipidemia, insomnia  Outpatient Medications Prior to Visit  Medication Sig Dispense Refill   aspirin 81 MG tablet Take 81 mg by mouth daily.     atorvastatin (LIPITOR) 20 MG tablet Take 1 tablet (20 mg total) by mouth every other day. Annual appt is due must see provider for future refills 90 tablet 0   cholecalciferol (VITAMIN D) 1000 units tablet Take 1,000 Units by mouth daily.     Glucosamine Sulfate 500 MG TABS Take by oral route.     glucosamine-chondroitin 500-400 MG tablet Take 1 tablet by mouth daily. (Patient not taking: Reported on 05/27/2022)     metoprolol succinate (TOPROL-XL) 50 MG 24 hr tablet Take 1 tablet (50 mg total) by mouth in the morning and at bedtime. Take with or immediately following a meal. 180 tablet 3   MULTIPLE VITAMIN PO Take by mouth.     PARoxetine (PAXIL) 10 MG tablet TAKE 1 TABLET BY MOUTH DAILY 90 tablet 1   valsartan-hydrochlorothiazide (DIOVAN-HCT) 320-25 MG tablet TAKE 1 TABLET BY MOUTH DAILY 90 tablet 2   zolpidem (AMBIEN) 5 MG tablet TAKE 1 TABLET BY MOUTH DAILY AT BEDTIME AS NEEDED FOR SLEEP, can repeat in 4 hrs if needed 180 tablet 0   diazepam (VALIUM) 5 MG tablet TAKE 1 TABLET BY MOUTH EVERY 6 HOURS AS NEEDED FOR ANXIETY DO NOT TAKE WITH ZOLPIDEM 30 tablet 3   pantoprazole (PROTONIX) 40 MG tablet Take 1 tablet (40 mg total) by mouth daily. 30 tablet 2   No facility-administered medications prior to visit.    ROS: Review of Systems  Constitutional:  Negative for activity change, appetite change, chills, fatigue and unexpected weight change.  HENT:  Negative for congestion, mouth sores and sinus pressure.   Eyes:  Negative for visual disturbance.  Respiratory:  Negative for cough and chest tightness.   Gastrointestinal:  Negative for  abdominal pain and nausea.  Genitourinary:  Negative for difficulty urinating, frequency and vaginal pain.  Musculoskeletal:  Negative for back pain and gait problem.  Skin:  Negative for pallor and rash.  Neurological:  Negative for dizziness, tremors, weakness, numbness and headaches.  Psychiatric/Behavioral:  Negative for confusion and sleep disturbance.     Objective:  BP (!) 180/78 (BP Location: Right Arm, Patient Position: Sitting, Cuff Size: Normal)   Pulse (!) 56   Temp 98.2 F (36.8 C) (Oral)   Ht 4' 11.5" (1.511 m)   Wt 118 lb (53.5 kg)   SpO2 97%   BMI 23.43 kg/m   BP Readings from Last 3 Encounters:  11/28/22 (!) 180/78  05/27/22 (!) 174/76  05/21/22 (!) 180/70    Wt Readings from Last 3 Encounters:  11/28/22 118 lb (53.5 kg)  05/27/22 123 lb (55.8 kg)  05/21/22 123 lb 4 oz (55.9 kg)    Physical Exam Constitutional:      General: She is not in acute distress.    Appearance: Normal appearance. She is well-developed.  HENT:     Head: Normocephalic.     Right Ear: External ear normal.     Left Ear: External ear normal.     Nose: Nose normal.  Eyes:     General:        Right eye: No discharge.  Left eye: No discharge.     Conjunctiva/sclera: Conjunctivae normal.     Pupils: Pupils are equal, round, and reactive to light.  Neck:     Thyroid: No thyromegaly.     Vascular: No JVD.     Trachea: No tracheal deviation.  Cardiovascular:     Rate and Rhythm: Normal rate and regular rhythm.     Heart sounds: Normal heart sounds.  Pulmonary:     Effort: No respiratory distress.     Breath sounds: No stridor. No wheezing.  Abdominal:     General: Bowel sounds are normal. There is no distension.     Palpations: Abdomen is soft. There is no mass.     Tenderness: There is no abdominal tenderness. There is no guarding or rebound.  Musculoskeletal:        General: No tenderness.     Cervical back: Normal range of motion and neck supple. No rigidity.   Lymphadenopathy:     Cervical: No cervical adenopathy.  Skin:    Findings: No erythema or rash.  Neurological:     Cranial Nerves: No cranial nerve deficit.     Motor: No abnormal muscle tone.     Coordination: Coordination normal.     Deep Tendon Reflexes: Reflexes normal.  Psychiatric:        Behavior: Behavior normal.        Thought Content: Thought content normal.        Judgment: Judgment normal.     Lab Results  Component Value Date   WBC 6.8 03/07/2022   HGB 12.9 03/07/2022   HCT 37.8 03/07/2022   PLT 283.0 03/07/2022   GLUCOSE 98 03/07/2022   CHOL 186 03/07/2022   TRIG 182.0 (H) 03/07/2022   HDL 57.60 03/07/2022   LDLDIRECT 84.1 07/20/2012   LDLCALC 92 03/07/2022   ALT 11 03/07/2022   AST 19 03/07/2022   NA 133 (L) 03/07/2022   K 3.8 03/07/2022   CL 93 (L) 03/07/2022   CREATININE 0.80 03/07/2022   BUN 20 03/07/2022   CO2 31 03/07/2022   TSH 1.12 03/07/2022    CT Head Wo Contrast  Result Date: 01/02/2022 CLINICAL DATA:  New onset headache EXAM: CT HEAD WITHOUT CONTRAST TECHNIQUE: Contiguous axial images were obtained from the base of the skull through the vertex without intravenous contrast. RADIATION DOSE REDUCTION: This exam was performed according to the departmental dose-optimization program which includes automated exposure control, adjustment of the mA and/or kV according to patient size and/or use of iterative reconstruction technique. COMPARISON:  Head CT December 01, 2017 FINDINGS: Brain: Mild diffuse age-related global parenchymal volume loss. No evidence of acute infarction, hemorrhage, hydrocephalus, extra-axial collection or mass lesion/mass effect. Vascular: No hyperdense vessel or unexpected calcification. Skull: Normal. Negative for fracture or focal lesion. Sinuses/Orbits: Visualized portions of the paranasal sinuses and ethmoid air cells are predominantly clear. Orbits are grossly unremarkable. Other: Mastoid air cells are predominantly clear.  IMPRESSION: No acute intracranial abnormality. Electronically Signed   By: Maudry Mayhew M.D.   On: 01/02/2022 12:55    Assessment & Plan:   Problem List Items Addressed This Visit     Situational anxiety    On Paxil 10 mg/d for anxiety Diazepam rare use prn  Potential benefits of a long term benzodiazepines  use as well as potential risks  and complications were explained to the patient and were aknowledged.       Relevant Medications   diazepam (VALIUM) 5 MG tablet  HTN (hypertension) - Primary    Continue w/Metoprolol -  50 mg bid, Valsartan HCT 320/25 qd. Cut back on salt Added Amlodipine 5 mg/d      Relevant Medications   amLODipine (NORVASC) 5 MG tablet   Other Relevant Orders   CBC with Differential/Platelet   Comprehensive metabolic panel   TSH   Urinalysis   Aldosterone + renin activity w/ ratio   Insomnia    Chronic - worse Increase zolpidem to 5-10 mg at at bedtime prn or 5 mg at hs and 5 mg at 3 am if awake  Potential benefits of a long term benzodiazepines  use as well as potential risks  and complications were explained to the patient and were aknowledged.      Hyperlipidemia    On Lipitor      Relevant Medications   amLODipine (NORVASC) 5 MG tablet   Other Relevant Orders   CBC with Differential/Platelet   Comprehensive metabolic panel   TSH   Urinalysis   Lipid panel      Meds ordered this encounter  Medications   amLODipine (NORVASC) 5 MG tablet    Sig: Take 1 tablet (5 mg total) by mouth daily.    Dispense:  30 tablet    Refill:  11   diazepam (VALIUM) 5 MG tablet    Sig: TAKE 1 TABLET BY MOUTH EVERY 6 HOURS AS NEEDED FOR ANXIETY DO NOT TAKE WITH ZOLPIDEM    Dispense:  30 tablet    Refill:  3   pantoprazole (PROTONIX) 40 MG tablet    Sig: Take 1 tablet (40 mg total) by mouth daily.    Dispense:  90 tablet    Refill:  3      Follow-up: Return in about 2 months (around 01/28/2023) for a follow-up visit.  Sonda Primes, MD

## 2022-11-28 NOTE — Assessment & Plan Note (Signed)
Chronic - worse Increase zolpidem to 5-10 mg at at bedtime prn or 5 mg at hs and 5 mg at 3 am if awake  Potential benefits of a long term benzodiazepines  use as well as potential risks  and complications were explained to the patient and were aknowledged.

## 2022-11-28 NOTE — Assessment & Plan Note (Signed)
On Lipitor 

## 2022-11-28 NOTE — Assessment & Plan Note (Signed)
On Paxil 10 mg/d for anxiety Diazepam rare use prn  Potential benefits of a long term benzodiazepines  use as well as potential risks  and complications were explained to the patient and were aknowledged.

## 2022-12-05 IMAGING — CT CT CHEST W/O CM
2 of 4 series · 15 of 36 positions shown, 18 images · non-contrast
Comparison: 12/15/2018

CLINICAL DATA: Abnormal x-ray.  Lung nodule.  Follow-up.

EXAM:
CT CHEST WITHOUT CONTRAST
TECHNIQUE: Multidetector CT imaging of the chest was performed following the
standard protocol without IV contrast.

[Series 2: thorax · axial · 0.69mm/px · z∈[-256,-28]mm · 12 of 136 slices shown, 15 images]
[im 11/136  mediastinal]
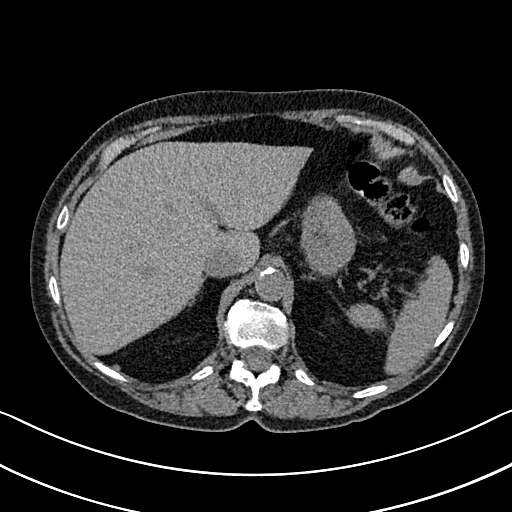
[im 11/136  lung]
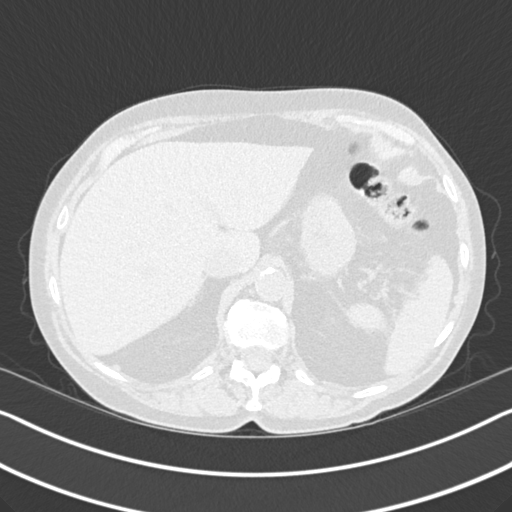
[im 21/136  lung]
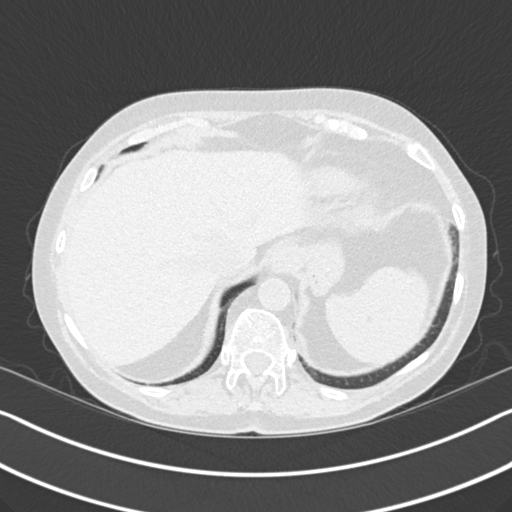
[im 32/136  lung]
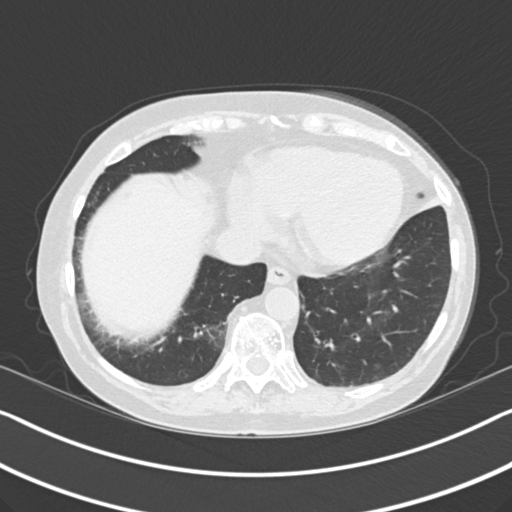
[im 42/136  lung]
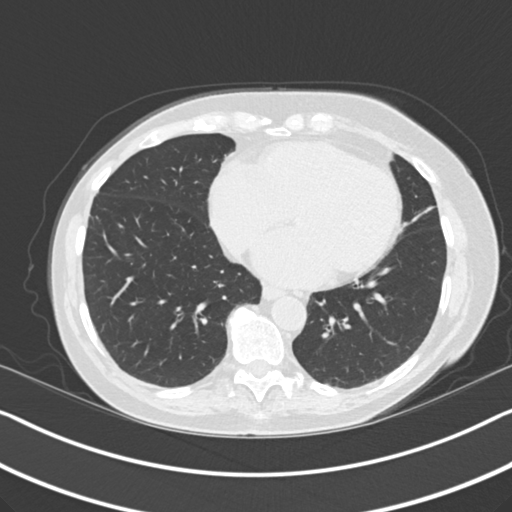
[im 52/136  mediastinal]
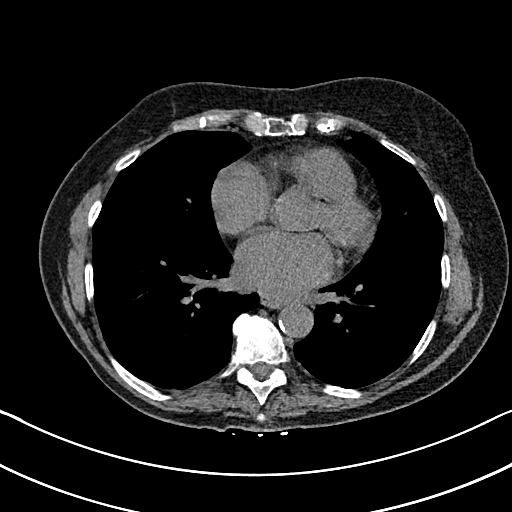
[im 52/136  lung]
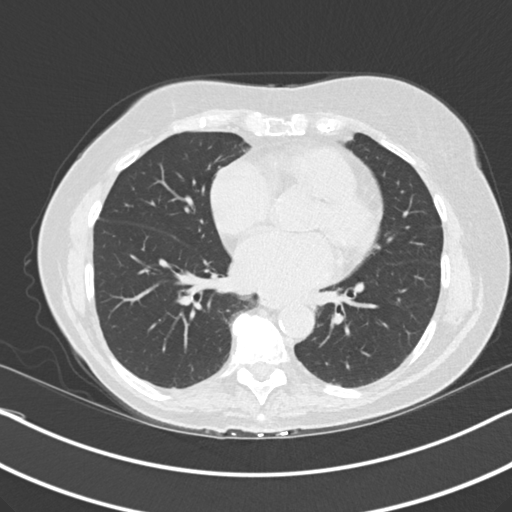
[im 63/136  lung]
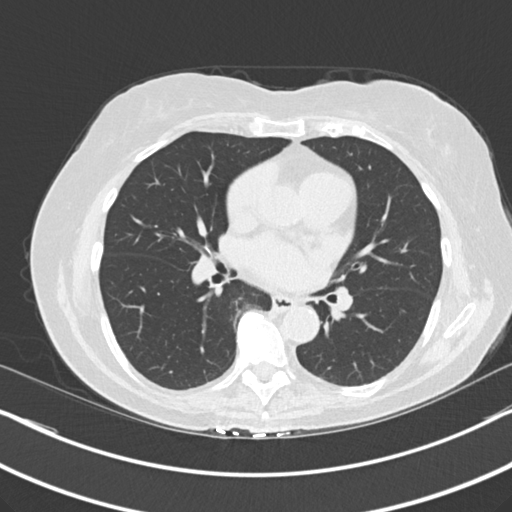
[im 73/136  lung]
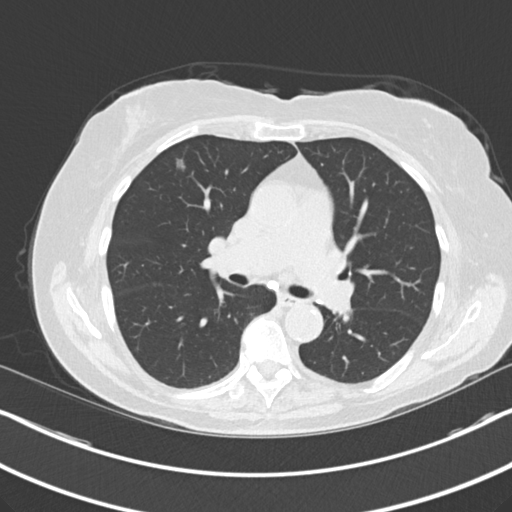
[im 84/136  lung]
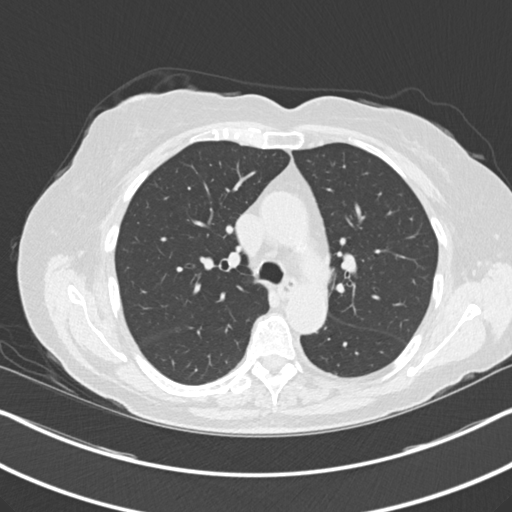
[im 94/136  mediastinal]
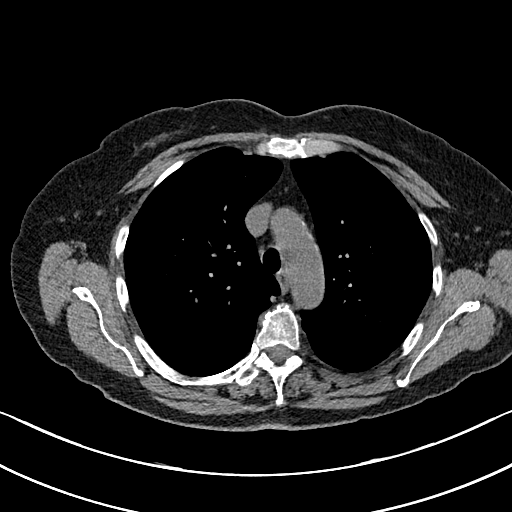
[im 94/136  lung]
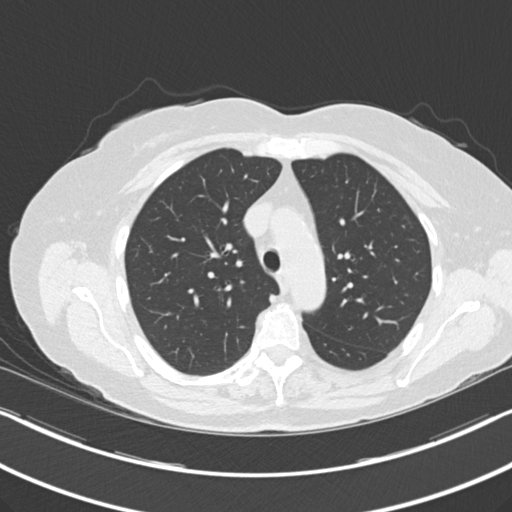
[im 104/136  lung]
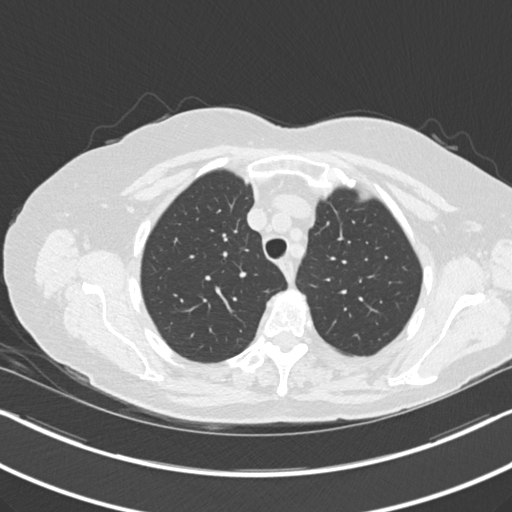
[im 115/136  lung]
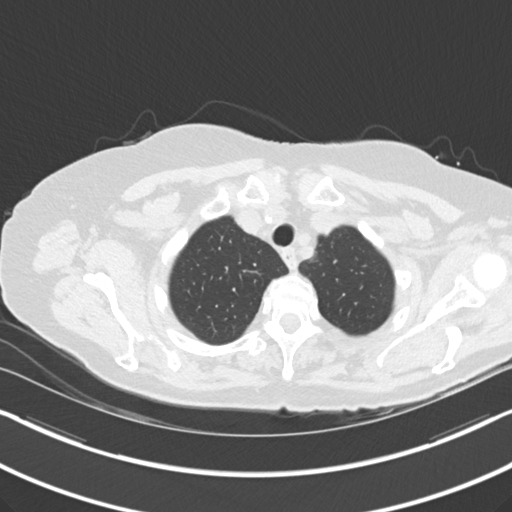
[im 125/136  lung]
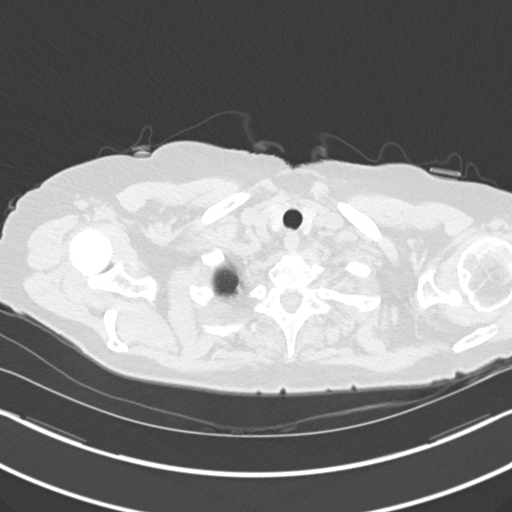

[Series 5: coronal · coronal · 0.59mm/px · 3 of 117 slices shown]
[im 24/117  lung]
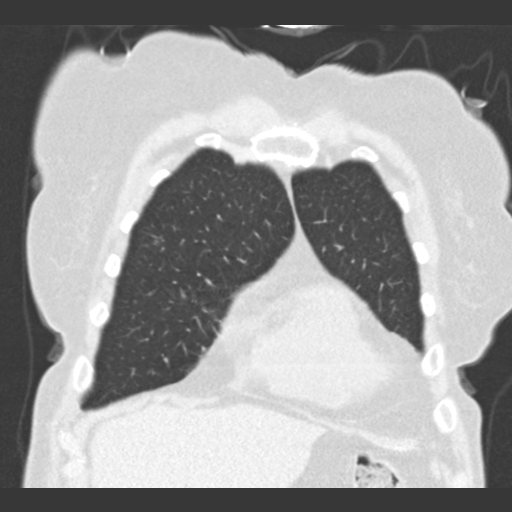
[im 47/117  lung]
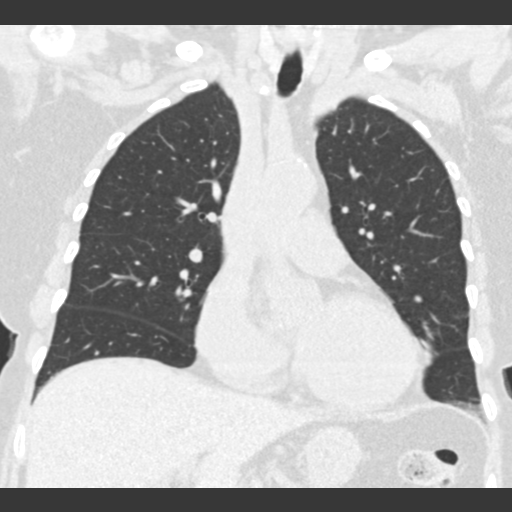
[im 70/117  lung]
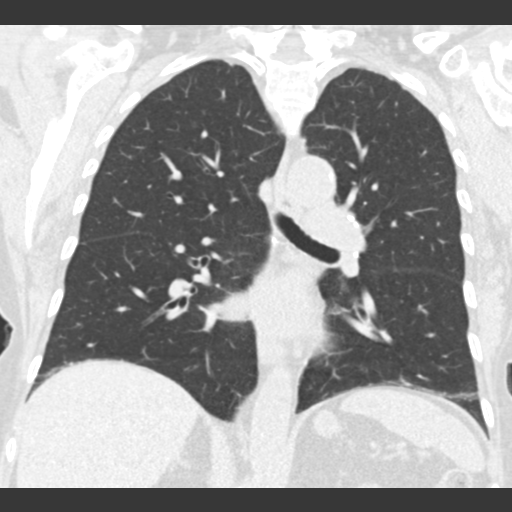

[15 of 36 positions shown; findings below may reference images not displayed]

FINDINGS: Cardiovascular: Heart size is normal. No pericardial fluid. No
visible coronary artery calcification. Mild aortic atherosclerotic
calcification.

Mediastinum/Nodes: No mediastinal or hilar mass or lymphadenopathy.
Small calcified nodes in the left hilum in subcarinal region.

Lungs/Pleura: Stable appearing 7 x 9 mm nodule in the anterior right
upper lobe, axial image 65. No sign of growth or contour change.
Calcified granuloma in the superior segment of the left lower lobe,
unchanged. No new pulmonary finding. Minimal linear scarring in the
lingula and at the lung bases.

Upper Abdomen: Normal

Musculoskeletal: Mild spinal curvature and degenerative change.
IMPRESSION: Stable examination. 7 x 9 mm nodule in the anterior right upper lobe
is stable in size and contour. Repeat scanning is recommended at 2
year intervals until 5 years of stability has been established. This
recommendation follows the consensus statement: Guidelines for
Management of Incidental Pulmonary Nodules Detected on CT Images:

Aortic Atherosclerosis ([id number].Y).

## 2022-12-06 ENCOUNTER — Telehealth: Payer: Self-pay | Admitting: Internal Medicine

## 2022-12-06 LAB — ALDOSTERONE + RENIN ACTIVITY W/ RATIO
ALDO / PRA Ratio: 53.3 {ratio} — ABNORMAL HIGH (ref 0.9–28.9)
Aldosterone: 8 ng/dL
Renin Activity: 0.15 ng/mL/h — ABNORMAL LOW (ref 0.25–5.82)

## 2022-12-06 NOTE — Telephone Encounter (Signed)
Patient called and does not feel like her blood pressure medication is working.  She is still experiencing numbers with the top number around 200, bottom number around 60-80.  Please call patient and advise.

## 2022-12-06 NOTE — Telephone Encounter (Signed)
Left detail message twice for pt about the need to increase her amlodipine from 5 mg to 10 mg and if she experience any of the symptoms from Sarah's note to call 911

## 2022-12-09 NOTE — Telephone Encounter (Signed)
LVM for pt to please call the clinic for instructions on medication.

## 2022-12-16 NOTE — Telephone Encounter (Signed)
Pt called asking do she need to go back to taking  1 or 2 pills for her blood pressure she also stated her Bp is being doing okay. Please advise

## 2022-12-20 NOTE — Telephone Encounter (Signed)
Patient said she is doing better taking two pills, but is now running out of medication. She is unable to get a refill since the pharmacy said it is too early. Patient would like a call back at [phone].

## 2022-12-23 MED ORDER — AMLODIPINE BESYLATE 10 MG PO TABS: 10.0000 mg | ORAL_TABLET | Freq: Every day | ORAL | 3 refills | Status: DC

## 2022-12-23 NOTE — Addendum Note (Signed)
Addended by: Tresa Garter on: 12/23/2022 07:53 AM   Modules accepted: Orders

## 2022-12-23 NOTE — Telephone Encounter (Signed)
I emailed a new prescription for amlodipine 10 mg.  Thank you

## 2023-01-27 ENCOUNTER — Other Ambulatory Visit: Payer: Self-pay | Admitting: Internal Medicine

## 2023-02-28 ENCOUNTER — Other Ambulatory Visit: Payer: Self-pay | Admitting: Internal Medicine

## 2023-03-05 ENCOUNTER — Ambulatory Visit: Payer: Medicare Other

## 2023-03-05 VITALS — Ht 61.0 in | Wt 123.0 lb

## 2023-03-05 DIAGNOSIS — Z0000 Encounter for general adult medical examination without abnormal findings: Secondary | ICD-10-CM

## 2023-03-05 NOTE — Progress Notes (Addendum)
Subjective:   Whitney Benson is a 89 y.o. female who presents for Medicare Annual (Subsequent) preventive examination.  Visit Complete: Virtual I connected with  [name] on 03/05/23 by a audio enabled telemedicine application and verified that I am speaking with the correct person using two identifiers.  Patient Location: Home  Provider Location: Office/Clinic  I discussed the limitations of evaluation and management by telemedicine. The patient expressed understanding and agreed to proceed.  Vital Signs: Because this visit was a virtual/telehealth visit, some criteria may be missing or patient reported. Any vitals not documented were not able to be obtained and vitals that have been documented are patient reported.   Cardiac Risk Factors include: advanced age (>37men, >87 women);hypertension;Other (see comment);dyslipidemia, Risk factor comments: Atherosclerosis of aorta, Osteopenia     Objective:    Today's Vitals   03/05/23 1544  Weight: 123 lb (55.8 kg)  Height: 5\' 1"  (1.549 m)   Body mass index is 23.24 kg/m.     03/05/2023    3:49 PM 03/06/2022    3:59 PM 01/02/2022   11:24 AM 09/26/2020    1:35 PM 12/01/2017    9:59 AM  Advanced Directives  Does Patient Have a Medical Advance Directive? Yes Yes No Yes No  Type of Estate agent of [place];Living will Healthcare Power of [place];Living will  Living will;Healthcare Power of Attorney   Does patient want to make changes to medical advance directive?    No - Patient declined   Copy of Healthcare Power of Attorney in Chart? No - copy requested No - copy requested  No - copy requested   Would patient like information on creating a medical advance directive?   No - Patient declined  Yes (ED - Information included in AVS)    Current Medications (verified) Outpatient Encounter Medications as of 03/05/2023  Medication Sig   amLODipine (NORVASC) 10 MG tablet Take 1 tablet (10 mg total) by mouth daily.    aspirin 81 MG tablet Take 81 mg by mouth daily.   atorvastatin (LIPITOR) 20 MG tablet TAKE 1 TABLET BY MOUTH EVERY OTHER DAY   cholecalciferol (VITAMIN D) 1000 units tablet Take 1,000 Units by mouth daily.   diazepam (VALIUM) 5 MG tablet TAKE 1 TABLET BY MOUTH EVERY 6 HOURS AS NEEDED FOR ANXIETY DO NOT TAKE WITH ZOLPIDEM   Glucosamine Sulfate 500 MG TABS Take by oral route.   glucosamine-chondroitin 500-400 MG tablet Take 1 tablet by mouth daily.   metoprolol succinate (TOPROL-XL) 50 MG 24 hr tablet Take 1 tablet (50 mg total) by mouth in the morning and at bedtime. Take with or immediately following a meal.   MULTIPLE VITAMIN PO Take by mouth.   pantoprazole (PROTONIX) 40 MG tablet Take 1 tablet (40 mg total) by mouth daily.   PARoxetine (PAXIL) 10 MG tablet TAKE 1 TABLET BY MOUTH DAILY   valsartan-hydrochlorothiazide (DIOVAN-HCT) 320-25 MG tablet TAKE 1 TABLET BY MOUTH DAILY   zolpidem (AMBIEN) 5 MG tablet TAKE 1 TABLET BY MOUTH AT BEDTIME AS NEEDED FOR SLEEP, CAN REPEAT IN 4 HOURS IF NEEDED   No facility-administered encounter medications on file as of 03/05/2023.    Allergies (verified) Hydrocodone   History: Past Medical History:  Diagnosis Date   Anxiety    Arthritis    DJD (degenerative joint disease)    History of asthma    History of colon polyps    Hyperlipidemia    Hypertension    Insomnia  Past Surgical History:  Procedure Laterality Date   DILATION AND CURETTAGE OF UTERUS     OTHER SURGICAL HISTORY  09/19/2011   mammogram and bone density   POLYPECTOMY     sun spot     from back   TUBAL LIGATION     Family History  Problem Relation Age of Onset   Cancer Mother    Hypertension Mother    Colon cancer Father 76   Stroke Maternal Grandmother    Stroke Paternal Grandmother    Arthritis Daughter    Anxiety disorder Daughter    Arthritis Daughter    Anxiety disorder Daughter    Hypertension Son    Hyperlipidemia Son    Arthritis Son    Anxiety disorder  Son    Esophageal cancer Neg Hx    Stomach cancer Neg Hx    Rectal cancer Neg Hx    Social History   Socioeconomic History   Marital status: Widowed    Spouse name: Not on file   Number of children: 4   Years of education: Not on file   Highest education level: Not on file  Occupational History   Occupation: retired  Tobacco Use   Smoking status: Former    Current packs/day: 0.00    Types: Cigarettes    Quit date: 1977    Years since quitting: 48.0   Smokeless tobacco: Never  Vaping Use   Vaping status: Never Used  Substance and Sexual Activity   Alcohol use: Yes    Comment: wine, 2 per day   Drug use: No   Sexual activity: Not on file  Other Topics Concern   Not on file  Social History Narrative   HSGMarried '612 sons- '69, '78, 2 daughters- '63, '65, 4 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, clerical work, Hospital doctor lives with her-2025   Social Drivers of Health   Financial Resource Strain: Low Risk  (03/05/2023)   Overall Financial Resource Strain (CARDIA)    Difficulty of Paying Living Expenses: Not hard at all  Food Insecurity: No Food Insecurity (03/05/2023)   Hunger Vital Sign    Worried About Running Out of Food in the Last Year: Never true    Ran Out of Food in the Last Year: Never true  Transportation Needs: No Transportation Needs (03/05/2023)   PRAPARE - Administrator, Civil Service (Medical): No    Lack of Transportation (Non-Medical): No  Physical Activity: Inactive (03/05/2023)   Exercise Vital Sign    Days of Exercise per Week: 0 days    Minutes of Exercise per Session: 0 min  Stress: Stress Concern Present (03/05/2023)   [employer] of Occupational Health - Occupational Stress Questionnaire    Feeling of Stress : Rather much  Social Connections: Socially Isolated (03/05/2023)   Social Connection and Isolation Panel [NHANES]    Frequency of Communication with Friends and Family: More than three times a week    Frequency of Social  Gatherings with Friends and Family: More than three times a week    Attends Religious Services: Never    Database administrator or Organizations: No    Attends Banker Meetings: Never    Marital Status: Widowed    Tobacco Counseling Counseling given: Not Answered   Clinical Intake:  Pre-visit preparation completed: Yes  Pain : No/denies pain     Nutritional Risks: None  How often do you need to have someone help you when you read  instructions, pamphlets, or other written materials from your doctor or pharmacy?: 1 - Never  Interpreter Needed?: No  Information entered by :: Cataleya Cristina, RMA   Activities of Daily Living    03/05/2023    3:46 PM 03/06/2022    4:08 PM  In your present state of health, do you have any difficulty performing the following activities:  Hearing? 1 0  Comment has some issues   Vision? 0 0  Difficulty concentrating or making decisions? 0 0  Walking or climbing stairs? 0 0  Dressing or bathing? 0 0  Doing errands, shopping? 1 0  Comment children drives her around   Preparing Food and eating ? N N  Using the Toilet? N N  In the past six months, have you accidently leaked urine? N N  Do you have problems with loss of bowel control? N N  Managing your Medications? N N  Managing your Finances? N N  Housekeeping or managing your Housekeeping? N N    Patient Care Team: Plotnikov, Georgina Quint, MD as PCP - General (Internal Medicine) Richardean Chimera, MD as Consulting Physician (Obstetrics and Gynecology) Jethro Bolus, MD as Consulting Physician (Ophthalmology) Arminda Resides, MD as Consulting Physician (Dermatology)  Indicate any recent Medical Services you may have received from other than [facility] providers in the past year (date may be approximate).     Assessment:   This is a routine wellness examination for [place].  Hearing/Vision screen Hearing Screening - Comments:: Some issues Vision Screening - Comments:: Wears eyeglasses    Goals Addressed               This Visit's Progress     Patient Stated (pt-stated)   On track     To maintain my current health status by continuing to eat healthy, stay physically active and socially active.      Depression Screen    03/05/2023    3:54 PM 11/28/2022    9:26 AM 03/07/2022    3:52 PM 03/06/2022    4:03 PM 01/14/2022    4:02 PM 09/04/2021    1:21 PM 09/26/2020    1:56 PM  PHQ 2/9 Scores  PHQ - 2 Score 0 0 0 0 0 4 0  PHQ- 9 Score 0 0  0 0 9     Fall Risk    03/05/2023    3:49 PM 11/28/2022    9:25 AM 03/07/2022    3:51 PM 03/06/2022    4:03 PM 01/14/2022    4:02 PM  Fall Risk   Falls in the past year? 0 0 0 0 0  Number falls in past yr: 0 0 0 0 0  Injury with Fall? 0 0 0 0 0  Risk for fall due to : No Fall Risks No Fall Risks No Fall Risks No Fall Risks No Fall Risks  Follow up Falls prevention discussed;Falls evaluation completed Falls evaluation completed Falls evaluation completed Falls prevention discussed Falls evaluation completed    MEDICARE RISK AT HOME: Medicare Risk at Home Any stairs in or around the home?: Yes If so, are there any without handrails?: Yes Home free of loose throw rugs in walkways, pet beds, electrical cords, etc?: Yes Adequate lighting in your home to reduce risk of falls?: Yes Life alert?: No Use of a cane, walker or w/c?: No Grab bars in the bathroom?: Yes Shower chair or bench in shower?: Yes Elevated toilet seat or a handicapped toilet?: Yes  TIMED UP AND GO:  Was the test performed?  No    Cognitive Function:        03/05/2023    3:50 PM 03/06/2022    4:10 PM  6CIT Screen  What Year? 0 points 0 points  What month? 0 points 0 points  What time? 0 points 0 points  Count back from 20 0 points 0 points  Months in reverse 2 points 0 points  Repeat phrase 2 points 0 points  Total Score 4 points 0 points    Immunizations Immunization History  Administered Date(s) Administered   Fluad Quad(high Dose 65+)  10/29/2018, 11/15/2019, 11/06/2021, 11/13/2022   H1N1 02/01/2008   Influenza Split 10/29/2011   Influenza Whole 12/08/2006, 11/15/2008, 10/25/2009   Influenza, High Dose Seasonal PF 11/09/2014, 12/09/2016, 10/27/2017, 12/19/2020   Influenza,inj,Quad PF,6+ Mos 10/29/2012, 11/04/2013   Influenza-Unspecified 11/25/2015   Moderna Covid-19 Vaccine Bivalent Booster 55yrs & up 11/06/2021   Moderna Sars-Covid-2 Vaccination 04/02/2019, 04/30/2019, 12/14/2019, 05/29/2020   Pfizer(Comirnaty)Fall Seasonal Vaccine 12 years and older 11/13/2022   Pneumococcal Conjugate-13 10/29/2012   Pneumococcal Polysaccharide-23 12/20/1998, 12/20/2007   Tdap 08/08/2017   Zoster, Live 04/17/2013    TDAP status: Up to date  Flu Vaccine status: Up to date  Pneumococcal vaccine status: Up to date  Covid-19 vaccine status: Completed vaccines  Qualifies for Shingles Vaccine? Yes   Zostavax completed Yes   Shingrix Completed?: No.    Education has been provided regarding the importance of this vaccine. Patient has been advised to call insurance company to determine out of pocket expense if they have not yet received this vaccine. Advised may also receive vaccine at local pharmacy or Health Dept. Verbalized acceptance and understanding.  Screening Tests Health Maintenance  Topic Date Due   Medicare Annual Wellness (AWV)  03/04/2024   DTaP/Tdap/Td (2 - Td or Tdap) 08/09/2027   Pneumonia Vaccine 70+ Years old  Completed   INFLUENZA VACCINE  Completed   DEXA SCAN  Completed   COVID-19 Vaccine  Completed   HPV VACCINES  Aged Out   MAMMOGRAM  Discontinued   Zoster Vaccines- Shingrix  Discontinued    Health Maintenance  There are no preventive care reminders to display for this patient.   Colorectal cancer screening: No longer required.   Mammogram status: No longer required due to age.  Bone Density status: Completed 11/25/2011. Results reflect: Bone density results: OSTEOPENIA. Repeat every 2  years.  Lung Cancer Screening: (Low Dose CT Chest recommended if Age 37-80 years, 20 pack-year currently smoking OR have quit w/in 15years.) does not qualify.   Lung Cancer Screening Referral: N/A  Additional Screening:  Hepatitis C Screening: does not qualify;  Vision Screening: Recommended annual ophthalmology exams for early detection of glaucoma and other disorders of the eye. Is the patient up to date with their annual eye exam?  Yes  Who is the provider or what is the name of the office in which the patient attends annual eye exams? [facility] If pt is not established with a provider, would they like to be referred to a provider to establish care? No .   Dental Screening: Recommended annual dental exams for proper oral hygiene   Community Resource Referral / Chronic Care Management: CRR required this visit?  No   CCM required this visit?  No     Plan:     I have personally reviewed and noted the following in the patient's chart:   Medical and social history Use of alcohol, tobacco or illicit drugs  Current  medications and supplements including opioid prescriptions. Patient is not currently taking opioid prescriptions. Functional ability and status Nutritional status Physical activity Advanced directives List of other physicians Hospitalizations, surgeries, and ER visits in previous 12 months Vitals Screenings to include cognitive, depression, and falls Referrals and appointments  In addition, I have reviewed and discussed with patient certain preventive protocols, quality metrics, and best practice recommendations. A written personalized care plan for preventive services as well as general preventive health recommendations were provided to patient.     Carol Theys L Alioune Hodgkin, CMA   03/05/2023   After Visit Summary: (Mail) Due to this being a telephonic visit, the after visit summary with patients personalized plan was offered to patient via mail   Nurse Notes: Patient is  up to date on all health maintenance with no concerns to address today.   Medical screening examination/treatment/procedure(s) were performed by non-physician practitioner and as supervising physician I was immediately available for consultation/collaboration.  I agree with above. Jacinta Shoe, MD

## 2023-03-05 NOTE — Patient Instructions (Signed)
 Ms. Whitney Benson , Thank you for taking time to come for your Medicare Wellness Visit. I appreciate your ongoing commitment to your health goals. Please review the following plan we discussed and let me know if I can assist you in the future.   Referrals/Orders/Follow-Ups/Clinician Recommendations: It was nice talking to you today.  Each day, aim for 6 glasses of water, plenty of protein in your diet and try to get up and walk/ stretch every hour for 5-10 minutes at a time.    This is a list of the screening recommended for you and due dates:  Health Maintenance  Topic Date Due   Medicare Annual Wellness Visit  03/04/2024   DTaP/Tdap/Td vaccine (2 - Td or Tdap) 08/09/2027   Pneumonia Vaccine  Completed   Flu Shot  Completed   DEXA scan (bone density measurement)  Completed   COVID-19 Vaccine  Completed   HPV Vaccine  Aged Out   Mammogram  Discontinued   Zoster (Shingles) Vaccine  Discontinued    Advanced directives: (Copy Requested) Please bring a copy of your health care power of attorney and living will to the office to be added to your chart at your convenience.  Next Medicare Annual Wellness Visit scheduled for next year: Yes

## 2023-03-07 ENCOUNTER — Other Ambulatory Visit: Payer: Self-pay | Admitting: Internal Medicine

## 2023-04-09 DIAGNOSIS — Z6824 Body mass index (BMI) 24.0-24.9, adult: Secondary | ICD-10-CM | POA: Diagnosis not present

## 2023-04-09 DIAGNOSIS — Z1231 Encounter for screening mammogram for malignant neoplasm of breast: Secondary | ICD-10-CM | POA: Diagnosis not present

## 2023-04-17 ENCOUNTER — Encounter: Payer: Self-pay | Admitting: Internal Medicine

## 2023-04-17 ENCOUNTER — Ambulatory Visit (INDEPENDENT_AMBULATORY_CARE_PROVIDER_SITE_OTHER): Payer: Medicare Other | Admitting: Internal Medicine

## 2023-04-17 ENCOUNTER — Ambulatory Visit: Payer: Medicare Other

## 2023-04-17 VITALS — BP 132/66 | HR 56 | Temp 98.1°F | Ht 61.0 in | Wt 121.6 lb

## 2023-04-17 DIAGNOSIS — I1A0 Resistant hypertension: Secondary | ICD-10-CM | POA: Diagnosis not present

## 2023-04-17 DIAGNOSIS — I700 Atherosclerosis of aorta: Secondary | ICD-10-CM

## 2023-04-17 DIAGNOSIS — F418 Other specified anxiety disorders: Secondary | ICD-10-CM

## 2023-04-17 DIAGNOSIS — F411 Generalized anxiety disorder: Secondary | ICD-10-CM | POA: Diagnosis not present

## 2023-04-17 DIAGNOSIS — I739 Peripheral vascular disease, unspecified: Secondary | ICD-10-CM

## 2023-04-17 DIAGNOSIS — Z789 Other specified health status: Secondary | ICD-10-CM | POA: Insufficient documentation

## 2023-04-17 LAB — TSH: TSH: 1.61 u[IU]/mL (ref 0.35–5.50)

## 2023-04-17 LAB — CBC WITH DIFFERENTIAL/PLATELET
Basophils Absolute: 0 10*3/uL (ref 0.0–0.1)
Basophils Relative: 0.7 % (ref 0.0–3.0)
Eosinophils Absolute: 0.1 10*3/uL (ref 0.0–0.7)
Eosinophils Relative: 1.8 % (ref 0.0–5.0)
HCT: 38.3 % (ref 36.0–46.0)
Hemoglobin: 13.1 g/dL (ref 12.0–15.0)
Lymphocytes Relative: 35.1 % (ref 12.0–46.0)
Lymphs Abs: 2.3 10*3/uL (ref 0.7–4.0)
MCHC: 34.1 g/dL (ref 30.0–36.0)
MCV: 95.4 fL (ref 78.0–100.0)
Monocytes Absolute: 0.7 10*3/uL (ref 0.1–1.0)
Monocytes Relative: 10.5 % (ref 3.0–12.0)
Neutro Abs: 3.4 10*3/uL (ref 1.4–7.7)
Neutrophils Relative %: 51.9 % (ref 43.0–77.0)
Platelets: 264 10*3/uL (ref 150.0–400.0)
RBC: 4.02 Mil/uL (ref 3.87–5.11)
RDW: 12.5 % (ref 11.5–15.5)
WBC: 6.6 10*3/uL (ref 4.0–10.5)

## 2023-04-17 LAB — COMPREHENSIVE METABOLIC PANEL
ALT: 15 U/L (ref 0–35)
AST: 19 U/L (ref 0–37)
Albumin: 4.2 g/dL (ref 3.5–5.2)
Alkaline Phosphatase: 40 U/L (ref 39–117)
BUN: 15 mg/dL (ref 6–23)
CO2: 32 meq/L (ref 19–32)
Calcium: 9.5 mg/dL (ref 8.4–10.5)
Chloride: 95 meq/L — ABNORMAL LOW (ref 96–112)
Creatinine, Ser: 0.86 mg/dL (ref 0.40–1.20)
GFR: 60.98 mL/min (ref >60.00)
Glucose, Bld: 99 mg/dL (ref 70–99)
Potassium: 3.3 meq/L — ABNORMAL LOW (ref 3.5–5.1)
Sodium: 134 meq/L — ABNORMAL LOW (ref 135–145)
Total Bilirubin: 0.5 mg/dL (ref 0.2–1.2)
Total Protein: 7.1 g/dL (ref 6.0–8.3)

## 2023-04-17 LAB — CK: Total CK: 91 U/L (ref 7–177)

## 2023-04-17 NOTE — Assessment & Plan Note (Signed)
 Better off Lipitor

## 2023-04-17 NOTE — Assessment & Plan Note (Signed)
 Paxil 10 mg/d  Continue with diazepam 5 mg twice daily as needed anxiety  Potential benefits of a long term benzodiazepines  use as well as potential risks  and complications were explained to the patient and were aknowledged. Consider Remeron

## 2023-04-17 NOTE — Assessment & Plan Note (Signed)
 Continue w/Metoprolol -  50 mg bid, Valsartan HCT 320/25 qd. Cut back on salt On Amlodipine 5 mg/d

## 2023-04-17 NOTE — Progress Notes (Signed)
 Subjective:  Patient ID: Whitney Benson, female    DOB: 1934/03/22  Age: 89 y.o. MRN: [id number]  CC: No chief complaint on file.   HPI ARIANNE KLINGE presents for HTN, dyslipidemia, anxiety, OA  Outpatient Medications Prior to Visit  Medication Sig Dispense Refill   amLODipine (NORVASC) 10 MG tablet Take 1 tablet (10 mg total) by mouth daily. 90 tablet 3   aspirin 81 MG tablet Take 81 mg by mouth daily.     cholecalciferol (VITAMIN D) 1000 units tablet Take 1,000 Units by mouth daily.     diazepam (VALIUM) 5 MG tablet TAKE 1 TABLET BY MOUTH EVERY 6 HOURS AS NEEDED FOR ANXIETY DO NOT TAKE WITH ZOLPIDEM 30 tablet 3   Glucosamine Sulfate 500 MG TABS Take by oral route.     glucosamine-chondroitin 500-400 MG tablet Take 1 tablet by mouth daily.     metoprolol succinate (TOPROL-XL) 50 MG 24 hr tablet Take 1 tablet (50 mg total) by mouth in the morning and at bedtime. Take with or immediately following a meal. 180 tablet 3   MULTIPLE VITAMIN PO Take by mouth.     pantoprazole (PROTONIX) 40 MG tablet Take 1 tablet (40 mg total) by mouth daily. 90 tablet 3   PARoxetine (PAXIL) 10 MG tablet TAKE 1 TABLET BY MOUTH DAILY (Patient taking differently: 1/2 tablet) 90 tablet 1   valsartan-hydrochlorothiazide (DIOVAN-HCT) 320-25 MG tablet TAKE 1 TABLET BY MOUTH DAILY 90 tablet 2   zolpidem (AMBIEN) 5 MG tablet TAKE 1 TABLET BY MOUTH AT BEDTIME AS NEEDED FOR SLEEP, CAN REPEAT IN 4 HOURS IF NEEDED 180 tablet 0   atorvastatin (LIPITOR) 20 MG tablet TAKE 1 TABLET BY MOUTH EVERY OTHER DAY 90 tablet 3   No facility-administered medications prior to visit.    ROS: Review of Systems  Constitutional:  Negative for activity change, appetite change, chills, fatigue and unexpected weight change.  HENT:  Negative for congestion, mouth sores and sinus pressure.   Eyes:  Negative for visual disturbance.  Respiratory:  Negative for cough and chest tightness.   Gastrointestinal:  Negative for abdominal pain and  nausea.  Genitourinary:  Negative for difficulty urinating, frequency and vaginal pain.  Musculoskeletal:  Negative for back pain and gait problem.  Skin:  Negative for pallor and rash.  Neurological:  Negative for dizziness, tremors, weakness, numbness and headaches.  Psychiatric/Behavioral:  Negative for confusion, sleep disturbance and suicidal ideas. The patient is nervous/anxious.     Objective:  BP 132/66 (BP Location: Left Arm, Patient Position: Sitting)   Pulse (!) 56   Temp 98.1 F (36.7 C) (Temporal)   Ht 5\' 1"  (1.549 m)   Wt 121 lb 9.6 oz (55.2 kg)   SpO2 98%   BMI 22.98 kg/m   BP Readings from Last 3 Encounters:  04/17/23 132/66  11/28/22 (!) 180/78  05/27/22 (!) 174/76    Wt Readings from Last 3 Encounters:  04/17/23 121 lb 9.6 oz (55.2 kg)  03/05/23 123 lb (55.8 kg)  11/28/22 118 lb (53.5 kg)    Physical Exam Constitutional:      General: She is not in acute distress.    Appearance: Normal appearance. She is well-developed.  HENT:     Head: Normocephalic.     Right Ear: External ear normal.     Left Ear: External ear normal.     Nose: Nose normal.  Eyes:     General:        Right eye:  No discharge.        Left eye: No discharge.     Conjunctiva/sclera: Conjunctivae normal.     Pupils: Pupils are equal, round, and reactive to light.  Neck:     Thyroid: No thyromegaly.     Vascular: No JVD.     Trachea: No tracheal deviation.  Cardiovascular:     Rate and Rhythm: Normal rate and regular rhythm.     Heart sounds: Normal heart sounds.  Pulmonary:     Effort: No respiratory distress.     Breath sounds: No stridor. No wheezing.  Abdominal:     General: Bowel sounds are normal. There is no distension.     Palpations: Abdomen is soft. There is no mass.     Tenderness: There is no abdominal tenderness. There is no guarding or rebound.  Musculoskeletal:        General: No tenderness.     Cervical back: Normal range of motion and neck supple. No  rigidity.  Lymphadenopathy:     Cervical: No cervical adenopathy.  Skin:    Findings: No erythema or rash.  Neurological:     Cranial Nerves: No cranial nerve deficit.     Motor: No abnormal muscle tone.     Coordination: Coordination normal.     Deep Tendon Reflexes: Reflexes normal.  Psychiatric:        Behavior: Behavior normal.        Thought Content: Thought content normal.        Judgment: Judgment normal.     Lab Results  Component Value Date   WBC 7.4 11/28/2022   HGB 13.3 11/28/2022   HCT 39.9 11/28/2022   PLT 283.0 11/28/2022   GLUCOSE 104 (H) 11/28/2022   CHOL 192 11/28/2022   TRIG 148.0 11/28/2022   HDL 68.30 11/28/2022   LDLDIRECT 84.1 07/20/2012   LDLCALC 94 11/28/2022   ALT 12 11/28/2022   AST 18 11/28/2022   NA 132 (L) 11/28/2022   K 4.0 11/28/2022   CL 94 (L) 11/28/2022   CREATININE 0.78 11/28/2022   BUN 18 11/28/2022   CO2 30 11/28/2022   TSH 1.33 11/28/2022    CT Head Wo Contrast Result Date: 01/02/2022 CLINICAL DATA:  New onset headache EXAM: CT HEAD WITHOUT CONTRAST TECHNIQUE: Contiguous axial images were obtained from the base of the skull through the vertex without intravenous contrast. RADIATION DOSE REDUCTION: This exam was performed according to the departmental dose-optimization program which includes automated exposure control, adjustment of the mA and/or kV according to patient size and/or use of iterative reconstruction technique. COMPARISON:  Head CT December 01, 2017 FINDINGS: Brain: Mild diffuse age-related global parenchymal volume loss. No evidence of acute infarction, hemorrhage, hydrocephalus, extra-axial collection or mass lesion/mass effect. Vascular: No hyperdense vessel or unexpected calcification. Skull: Normal. Negative for fracture or focal lesion. Sinuses/Orbits: Visualized portions of the paranasal sinuses and ethmoid air cells are predominantly clear. Orbits are grossly unremarkable. Other: Mastoid air cells are predominantly  clear. IMPRESSION: No acute intracranial abnormality. Electronically Signed   By: Maudry Mayhew M.D.   On: 01/02/2022 12:55    Assessment & Plan:   Problem List Items Addressed This Visit     Situational anxiety   On Paxil 10 mg/d for anxiety Diazepam rare use prn  Potential benefits of a long term benzodiazepines  use as well as potential risks  and complications were explained to the patient and were aknowledged. Not driving       Relevant Orders  Comprehensive metabolic panel   CK   CBC with Differential/Platelet   TSH   HTN (hypertension)   Continue w/Metoprolol -  50 mg bid, Valsartan HCT 320/25 qd. Cut back on salt On Amlodipine 5 mg/d      Relevant Orders   Comprehensive metabolic panel   CK   CBC with Differential/Platelet   TSH   Anxiety disorder - Primary   Paxil 10 mg/d  Continue with diazepam 5 mg twice daily as needed anxiety  Potential benefits of a long term benzodiazepines  use as well as potential risks  and complications were explained to the patient and were aknowledged. Consider Remeron      Relevant Orders   Comprehensive metabolic panel   CK   CBC with Differential/Platelet   TSH   Claudication (HCC)   Off Lipitor      Relevant Orders   CBC with Differential/Platelet   Atherosclerosis of aorta (HCC)   Off Lipitor Fish oil      Statin intolerance   Better off Lipitor         No orders of the defined types were placed in this encounter.     Follow-up: Return in about 3 months (around 07/15/2023) for a follow-up visit.  Sonda Primes, MD

## 2023-04-17 NOTE — Assessment & Plan Note (Signed)
 Off Lipitor Fish oil

## 2023-04-17 NOTE — Assessment & Plan Note (Signed)
Off Lipitor 

## 2023-04-17 NOTE — Assessment & Plan Note (Addendum)
 On Paxil 10 mg/d for anxiety Diazepam rare use prn  Potential benefits of a long term benzodiazepines  use as well as potential risks  and complications were explained to the patient and were aknowledged. Not driving

## 2023-04-22 MED ORDER — POTASSIUM CHLORIDE CRYS ER 20 MEQ PO TBCR: 20.0000 meq | EXTENDED_RELEASE_TABLET | Freq: Every day | ORAL | 0 refills | Status: AC

## 2023-04-22 NOTE — Addendum Note (Signed)
 Addended by: Tresa Garter on: 04/22/2023 12:09 AM   Modules accepted: Orders

## 2023-05-28 DIAGNOSIS — L821 Other seborrheic keratosis: Secondary | ICD-10-CM | POA: Diagnosis not present

## 2023-05-28 DIAGNOSIS — Z85828 Personal history of other malignant neoplasm of skin: Secondary | ICD-10-CM | POA: Diagnosis not present

## 2023-05-28 DIAGNOSIS — L570 Actinic keratosis: Secondary | ICD-10-CM | POA: Diagnosis not present

## 2023-06-30 ENCOUNTER — Ambulatory Visit

## 2023-07-10 DIAGNOSIS — M8588 Other specified disorders of bone density and structure, other site: Secondary | ICD-10-CM | POA: Diagnosis not present

## 2023-07-10 DIAGNOSIS — R300 Dysuria: Secondary | ICD-10-CM | POA: Diagnosis not present

## 2023-07-22 ENCOUNTER — Ambulatory Visit (INDEPENDENT_AMBULATORY_CARE_PROVIDER_SITE_OTHER)

## 2023-07-22 ENCOUNTER — Other Ambulatory Visit: Payer: Self-pay | Admitting: Internal Medicine

## 2023-07-22 VITALS — Ht 61.0 in | Wt 121.0 lb

## 2023-07-22 DIAGNOSIS — Z0000 Encounter for general adult medical examination without abnormal findings: Secondary | ICD-10-CM | POA: Diagnosis not present

## 2023-07-22 NOTE — Progress Notes (Addendum)
 Subjective:   Whitney Benson is a 89 y.o. who presents for a Medicare Wellness preventive visit.  As a reminder, Annual Wellness Visits don't include a physical exam, and some assessments may be limited, especially if this visit is performed virtually. We may recommend an in-person follow-up visit with your provider if needed.  Visit Complete: Virtual I connected with  Melodi Sprung on 07/22/23 by a audio enabled telemedicine application and verified that I am speaking with the correct person using two identifiers.  Patient Location: Home  Provider Location: Home Office  I discussed the limitations of evaluation and management by telemedicine. The patient expressed understanding and agreed to proceed.  Vital Signs: Because this visit was a virtual/telehealth visit, some criteria may be missing or patient reported. Any vitals not documented were not able to be obtained and vitals that have been documented are patient reported.  VideoDeclined- This patient declined Librarian, academic. Therefore the visit was completed with audio only.  Persons Participating in Visit: Patient assisted by her son.  AWV Questionnaire: No: Patient Medicare AWV questionnaire was not completed prior to this visit.  Cardiac Risk Factors include: advanced age (>60men, >11 women);hypertension;dyslipidemia;Other (see comment), Risk factor comments: CAP, Atherosclerosis of aorta, Asthma     Objective:     Today's Vitals   07/22/23 1455  Weight: 121 lb (54.9 kg)  Height: 5\' 1"  (1.549 m)   Body mass index is 22.86 kg/m.     07/22/2023    3:09 PM 03/05/2023    3:49 PM 03/06/2022    3:59 PM 01/02/2022   11:24 AM 09/26/2020    1:35 PM 12/01/2017    9:59 AM  Advanced Directives  Does Patient Have a Medical Advance Directive? Yes Yes Yes No Yes No  Type of Estate agent of [place];Living will Healthcare Power of [place];Living will Healthcare Power of  [place];Living will  Living will;Healthcare Power of Attorney   Does patient want to make changes to medical advance directive?     No - Patient declined   Copy of Healthcare Power of Attorney in Chart? No - copy requested No - copy requested No - copy requested  No - copy requested   Would patient like information on creating a medical advance directive?    No - Patient declined  Yes (ED - Information included in AVS)    Current Medications (verified) Outpatient Encounter Medications as of 07/22/2023  Medication Sig   amLODipine  (NORVASC ) 10 MG tablet Take 1 tablet (10 mg total) by mouth daily.   aspirin 81 MG tablet Take 81 mg by mouth daily.   cholecalciferol (VITAMIN D ) 1000 units tablet Take 1,000 Units by mouth daily.   clobetasol ointment (TEMOVATE) 0.05 % SMARTSIG:sparingly Topical Twice Daily   diazepam  (VALIUM ) 5 MG tablet TAKE 1 TABLET BY MOUTH EVERY 6 HOURS AS NEEDED FOR ANXIETY DO NOT TAKE WITH ZOLPIDEM    Glucosamine Sulfate 500 MG TABS Take by oral route.   glucosamine-chondroitin 500-400 MG tablet Take 1 tablet by mouth daily.   metoprolol  succinate (TOPROL -XL) 50 MG 24 hr tablet Take 1 tablet (50 mg total) by mouth in the morning and at bedtime. Take with or immediately following a meal.   MULTIPLE VITAMIN PO Take by mouth.   pantoprazole  (PROTONIX ) 40 MG tablet Take 1 tablet (40 mg total) by mouth daily.   PARoxetine  (PAXIL ) 10 MG tablet TAKE 1 TABLET BY MOUTH DAILY (Patient taking differently: 1/2 tablet)   potassium chloride   SA (KLOR-CON  M) 20 MEQ tablet Take 1 tablet (20 mEq total) by mouth daily.   valsartan -hydrochlorothiazide  (DIOVAN -HCT) 320-25 MG tablet TAKE 1 TABLET BY MOUTH DAILY   zolpidem  (AMBIEN ) 5 MG tablet TAKE 1 TABLET BY MOUTH AT BEDTIME AS NEEDED FOR SLEEP, CAN REPEAT IN 4 HOURS IF NEEDED   No facility-administered encounter medications on file as of 07/22/2023.    Allergies (verified) Hydrocodone  and Lipitor [atorvastatin ]   History: Past Medical  History:  Diagnosis Date   Anxiety    Arthritis    DJD (degenerative joint disease)    History of asthma    History of colon polyps    Hyperlipidemia    Hypertension    Insomnia    Past Surgical History:  Procedure Laterality Date   DILATION AND CURETTAGE OF UTERUS     OTHER SURGICAL HISTORY  09/19/2011   mammogram and bone density   POLYPECTOMY     sun spot     from back   TUBAL LIGATION     Family History  Problem Relation Age of Onset   Cancer Mother    Hypertension Mother    Colon cancer Father 77   Stroke Maternal Grandmother    Stroke Paternal Grandmother    Arthritis Daughter    Anxiety disorder Daughter    Arthritis Daughter    Anxiety disorder Daughter    Hypertension Son    Hyperlipidemia Son    Arthritis Son    Anxiety disorder Son    Esophageal cancer Neg Hx    Stomach cancer Neg Hx    Rectal cancer Neg Hx    Social History   Socioeconomic History   Marital status: Widowed    Spouse name: Not on file   Number of children: 4   Years of education: Not on file   Highest education level: Not on file  Occupational History   Occupation: retired  Tobacco Use   Smoking status: Former    Current packs/day: 0.00    Types: Cigarettes    Quit date: 1977    Years since quitting: 48.4   Smokeless tobacco: Never  Vaping Use   Vaping status: Never Used  Substance and Sexual Activity   Alcohol use: Yes    Comment: wine, 2 per day   Drug use: No   Sexual activity: Not on file  Other Topics Concern   Not on file  Social History Narrative   HSGMarried '612 sons- '69, '78, 2 daughters- '63, '65, 4 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, clerical work, homemaker      Son lives with her-2025   Social Drivers of Health   Financial Resource Strain: Low Risk  (07/22/2023)   Overall Financial Resource Strain (CARDIA)    Difficulty of Paying Living Expenses: Not very hard  Food Insecurity: No Food Insecurity (07/22/2023)   Hunger Vital Sign    Worried About Running Out  of Food in the Last Year: Never true    Ran Out of Food in the Last Year: Never true  Transportation Needs: No Transportation Needs (07/22/2023)   PRAPARE - Administrator, Civil Service (Medical): No    Lack of Transportation (Non-Medical): No  Physical Activity: Inactive (07/22/2023)   Exercise Vital Sign    Days of Exercise per Week: 0 days    Minutes of Exercise per Session: 0 min  Stress: No Stress Concern Present (07/22/2023)   [employer] of Occupational Health - Occupational Stress Questionnaire    Feeling of Stress : Not  at all  Social Connections: Socially Isolated (07/22/2023)   Social Connection and Isolation Panel [NHANES]    Frequency of Communication with Friends and Family: More than three times a week    Frequency of Social Gatherings with Friends and Family: Once a week    Attends Religious Services: Never    Database administrator or Organizations: No    Attends Banker Meetings: Never    Marital Status: Widowed    Tobacco Counseling Counseling given: Not Answered    Clinical Intake:  Pre-visit preparation completed: Yes  Pain : No/denies pain     BMI - recorded: 22.86 Nutritional Status: BMI 25 -29 Overweight Nutritional Risks: None Diabetes: No  No results found for: "HGBA1C"   How often do you need to have someone help you when you read instructions, pamphlets, or other written materials from your doctor or pharmacy?: 1 - Never  Interpreter Needed?: No  Information entered by :: Calandra Madura, RMA   Activities of Daily Living     07/22/2023    2:57 PM 03/05/2023    3:46 PM  In your present state of health, do you have any difficulty performing the following activities:  Hearing? 1 1  Comment has issues with her hearing has some issues  Vision? 0 0  Difficulty concentrating or making decisions? 0 0  Walking or climbing stairs? 0 0  Dressing or bathing? 0 0  Doing errands, shopping? 0 1  Comment Son drives her  around children drives her around  Quarry manager and eating ? N N  Using the Toilet? N N  In the past six months, have you accidently leaked urine? N N  Do you have problems with loss of bowel control? N N  Managing your Medications? N N  Managing your Finances? N N  Housekeeping or managing your Housekeeping? N N    Patient Care Team: Plotnikov, Oakley Bellman, MD as PCP - General (Internal Medicine) Merryl Abraham, MD as Consulting Physician (Obstetrics and Gynecology) Albert Huff, MD as Consulting Physician (Ophthalmology) Drusilla Gerlach, MD as Consulting Physician (Dermatology)  I have updated your Care Teams any recent Medical Services you may have received from other providers in the past year.     Assessment:    This is a routine wellness examination for Marg.  Hearing/Vision screen Hearing Screening - Comments:: has issues with her hearing Vision Screening - Comments:: Denies vision issues./need a recommendation/[facility]    Goals Addressed   None    Depression Screen     07/22/2023    3:14 PM 03/05/2023    3:54 PM 11/28/2022    9:26 AM 03/07/2022    3:52 PM 03/06/2022    4:03 PM 01/14/2022    4:02 PM 09/04/2021    1:21 PM  PHQ 2/9 Scores  PHQ - 2 Score 0 0 0 0 0 0 4  PHQ- 9 Score 0 0 0  0 0 9    Fall Risk     07/22/2023    3:11 PM 03/05/2023    3:49 PM 11/28/2022    9:25 AM 03/07/2022    3:51 PM 03/06/2022    4:03 PM  Fall Risk   Falls in the past year? 0 0 0 0 0  Number falls in past yr: 0 0 0 0 0  Injury with Fall? 0 0 0 0 0  Risk for fall due to : No Fall Risks No Fall Risks No Fall Risks No Fall Risks No Fall  Risks  Follow up Falls evaluation completed Falls prevention discussed;Falls evaluation completed Falls evaluation completed Falls evaluation completed Falls prevention discussed    MEDICARE RISK AT HOME:  Medicare Risk at Home Any stairs in or around the home?: Yes (has a 2 story home) If so, are there any without handrails?: Yes Home free of loose  throw rugs in walkways, pet beds, electrical cords, etc?: Yes Adequate lighting in your home to reduce risk of falls?: Yes Life alert?: No Use of a cane, walker or w/c?: No Grab bars in the bathroom?: Yes Shower chair or bench in shower?: Yes Elevated toilet seat or a handicapped toilet?: Yes  TIMED UP AND GO:  Was the test performed?  No  Cognitive Function: 6CIT completed        03/05/2023    3:50 PM 03/06/2022    4:10 PM  6CIT Screen  What Year? 0 points 0 points  What month? 0 points 0 points  What time? 0 points 0 points  Count back from 20 0 points 0 points  Months in reverse 2 points 0 points  Repeat phrase 2 points 0 points  Total Score 4 points 0 points    Immunizations Immunization History  Administered Date(s) Administered   Fluad Quad(high Dose 65+) 10/29/2018, 11/15/2019, 11/06/2021, 11/13/2022   H1N1 02/01/2008   Influenza Split 10/29/2011   Influenza Whole 12/08/2006, 11/15/2008, 10/25/2009   Influenza, High Dose Seasonal PF 11/09/2014, 12/09/2016, 10/27/2017, 12/19/2020   Influenza,inj,Quad PF,6+ Mos 10/29/2012, 11/04/2013   Influenza-Unspecified 11/25/2015   Moderna Covid-19 Vaccine Bivalent Booster 43yrs & up 11/06/2021   Moderna Sars-Covid-2 Vaccination 04/02/2019, 04/30/2019, 12/14/2019, 05/29/2020   Pfizer(Comirnaty)Fall Seasonal Vaccine 12 years and older 11/13/2022   Pneumococcal Conjugate-13 10/29/2012   Pneumococcal Polysaccharide-23 12/20/1998, 12/20/2007   Tdap 08/08/2017   Zoster, Live 04/17/2013    Screening Tests Health Maintenance  Topic Date Due   COVID-19 Vaccine (7 - 2024-25 season) 05/13/2023   INFLUENZA VACCINE  09/19/2023   Medicare Annual Wellness (AWV)  07/21/2024   DTaP/Tdap/Td (2 - Td or Tdap) 08/09/2027   Pneumonia Vaccine 5+ Years old  Completed   DEXA SCAN  Completed   HPV VACCINES  Aged Out   Meningococcal B Vaccine  Aged Out   MAMMOGRAM  Discontinued   Zoster Vaccines- Shingrix  Discontinued    Health  Maintenance  Health Maintenance Due  Topic Date Due   COVID-19 Vaccine (7 - 2024-25 season) 05/13/2023   Health Maintenance Items Addressed: See Nurse Notes at the end of this note  Additional Screening:  Vision Screening: Recommended annual ophthalmology exams for early detection of glaucoma and other disorders of the eye. Would you like a referral to an eye doctor? Yes    Dental Screening: Recommended annual dental exams for proper oral hygiene  Community Resource Referral / Chronic Care Management: CRR required this visit?  No   CCM required this visit?  No   Plan:    I have personally reviewed and noted the following in the patient's chart:   Medical and social history Use of alcohol, tobacco or illicit drugs  Current medications and supplements including opioid prescriptions. Patient is not currently taking opioid prescriptions. Functional ability and status Nutritional status Physical activity Advanced directives List of other physicians Hospitalizations, surgeries, and ER visits in previous 12 months Vitals Screenings to include cognitive, depression, and falls Referrals and appointments  In addition, I have reviewed and discussed with patient certain preventive protocols, quality metrics, and best practice recommendations. A  written personalized care plan for preventive services as well as general preventive health recommendations were provided to patient.   Estelle Greenleaf L Raju Coppolino, CMA   07/22/2023   After Visit Summary: (Mail) Due to this being a telephonic visit, the after visit summary with patients personalized plan was offered to patient via mail   Notes: Please refer to Routing Comments.  Medical screening examination/treatment/procedure(s) were performed by non-physician practitioner and as supervising physician I was immediately available for consultation/collaboration.  I agree with above. Adelaide Holy, MD

## 2023-07-22 NOTE — Patient Instructions (Signed)
 Whitney Benson , Thank you for taking time out of your busy schedule to complete your Annual Wellness Visit with me. I enjoyed our conversation and look forward to speaking with you again next year. I, as well as your care team,  appreciate your ongoing commitment to your health goals. Please review the following plan we discussed and let me know if I can assist you in the future. Your Game plan/ To Do List   Follow up Visits: Next Medicare AWV with our clinical staff: 07/22/2024.   Have you seen your provider in the last 6 months (3 months if uncontrolled diabetes)? No Next Office Visit with your provider: Last office visit on 04/17/2023.  Patient stated that she will call office to schedule her 6 month follow up visit.  Clinician Recommendations:  Aim for 30 minutes of exercise or brisk walking, 6-8 glasses of water, and 5 servings of fruits and vegetables each day. Keep up the good work.      This is a list of the screening recommended for you and due dates:  Health Maintenance  Topic Date Due   COVID-19 Vaccine (7 - 2024-25 season) 05/13/2023   Flu Shot  09/19/2023   Medicare Annual Wellness Visit  07/21/2024   DTaP/Tdap/Td vaccine (2 - Td or Tdap) 08/09/2027   Pneumonia Vaccine  Completed   DEXA scan (bone density measurement)  Completed   HPV Vaccine  Aged Out   Meningitis B Vaccine  Aged Out   Mammogram  Discontinued   Zoster (Shingles) Vaccine  Discontinued    Advanced directives: (Copy Requested) Please bring a copy of your health care power of attorney and living will to the office to be added to your chart at your convenience. You can mail to [facility] [address] [place], [place] [address] or email to [email or web address] Advance Care Planning is important because it:  [x]  Makes sure you receive the medical care that is consistent with your values, goals, and preferences  [x]  It provides guidance to your family and loved ones and reduces their  decisional burden about whether or not they are making the right decisions based on your wishes.  Follow the link provided in your after visit summary or read over the paperwork we have mailed to you to help you started getting your Advance Directives in place. If you need assistance in completing these, please reach out to us  so that we can help you!  See attachments for Preventive Care and Fall Prevention Tips.

## 2023-09-19 ENCOUNTER — Encounter: Payer: Self-pay | Admitting: Internal Medicine

## 2023-09-19 ENCOUNTER — Ambulatory Visit (INDEPENDENT_AMBULATORY_CARE_PROVIDER_SITE_OTHER): Admitting: Internal Medicine

## 2023-09-19 VITALS — BP 130/80 | HR 51 | Temp 97.7°F | Ht 61.0 in | Wt 124.0 lb

## 2023-09-19 DIAGNOSIS — J4520 Mild intermittent asthma, uncomplicated: Secondary | ICD-10-CM

## 2023-09-19 DIAGNOSIS — I739 Peripheral vascular disease, unspecified: Secondary | ICD-10-CM | POA: Diagnosis not present

## 2023-09-19 DIAGNOSIS — I1A0 Resistant hypertension: Secondary | ICD-10-CM | POA: Diagnosis not present

## 2023-09-19 DIAGNOSIS — F411 Generalized anxiety disorder: Secondary | ICD-10-CM

## 2023-09-19 DIAGNOSIS — I700 Atherosclerosis of aorta: Secondary | ICD-10-CM | POA: Diagnosis not present

## 2023-09-19 LAB — COMPREHENSIVE METABOLIC PANEL WITH GFR
ALT: 12 U/L (ref 0–35)
AST: 17 U/L (ref 0–37)
Albumin: 4.2 g/dL (ref 3.5–5.2)
Alkaline Phosphatase: 40 U/L (ref 39–117)
BUN: 16 mg/dL (ref 6–23)
CO2: 30 meq/L (ref 19–32)
Calcium: 9.3 mg/dL (ref 8.4–10.5)
Chloride: 94 meq/L — ABNORMAL LOW (ref 96–112)
Creatinine, Ser: 0.75 mg/dL (ref 0.40–1.20)
GFR: 71.65 mL/min (ref >60.00)
Glucose, Bld: 102 mg/dL — ABNORMAL HIGH (ref 70–99)
Potassium: 4 meq/L (ref 3.5–5.1)
Sodium: 132 meq/L — ABNORMAL LOW (ref 135–145)
Total Bilirubin: 0.5 mg/dL (ref 0.2–1.2)
Total Protein: 7 g/dL (ref 6.0–8.3)

## 2023-09-19 LAB — TSH: TSH: 1.52 u[IU]/mL (ref 0.35–5.50)

## 2023-09-19 NOTE — Progress Notes (Signed)
 Subjective:  Patient ID: Whitney Benson, female    DOB: 13-Mar-1934  Age: 89 y.o. MRN: [id number]  CC: Medical Management of Chronic Issues (6 mnth f/u )   HPI Whitney Benson presents for HTN, dyslipidemia, insomnia  Outpatient Medications Prior to Visit  Medication Sig Dispense Refill   amLODipine  (NORVASC ) 10 MG tablet Take 1 tablet (10 mg total) by mouth daily. 90 tablet 3   aspirin 81 MG tablet Take 81 mg by mouth daily.     atorvastatin  (LIPITOR) 20 MG tablet Take 20 mg by mouth every other day.     cholecalciferol (VITAMIN D ) 1000 units tablet Take 1,000 Units by mouth daily.     clobetasol ointment (TEMOVATE) 0.05 % SMARTSIG:sparingly Topical Twice Daily     diazepam  (VALIUM ) 5 MG tablet TAKE 1 TABLET BY MOUTH EVERY 6 HOURS AS NEEDED FOR ANXIETY DO NOT TAKE WITH ZOLPIDEM  30 tablet 3   Glucosamine Sulfate 500 MG TABS Take by oral route.     glucosamine-chondroitin 500-400 MG tablet Take 1 tablet by mouth daily.     metoprolol  succinate (TOPROL -XL) 50 MG 24 hr tablet Take 1 tablet (50 mg total) by mouth in the morning and at bedtime. Take with or immediately following a meal. 180 tablet 3   MULTIPLE VITAMIN PO Take by mouth.     pantoprazole  (PROTONIX ) 40 MG tablet Take 1 tablet (40 mg total) by mouth daily. 90 tablet 3   PARoxetine  (PAXIL ) 10 MG tablet TAKE 1 TABLET BY MOUTH DAILY (Patient taking differently: 1/2 tablet) 90 tablet 1   potassium chloride  SA (KLOR-CON  M) 20 MEQ tablet Take 1 tablet (20 mEq total) by mouth daily. 30 tablet 0   valsartan -hydrochlorothiazide  (DIOVAN -HCT) 320-25 MG tablet TAKE 1 TABLET BY MOUTH DAILY 90 tablet 2   zolpidem  (AMBIEN ) 5 MG tablet TAKE 1 TABLET BY MOUTH AT BEDTIME AS NEEDED FOR SLEEP, CAN REPEAT IN 4 HOURS IF NEEDED 180 tablet 0   No facility-administered medications prior to visit.    ROS: Review of Systems  Constitutional:  Negative for activity change, appetite change, chills, fatigue and unexpected weight change.  HENT:  Negative for  congestion, mouth sores and sinus pressure.   Eyes:  Negative for visual disturbance.  Respiratory:  Negative for cough and chest tightness.   Gastrointestinal:  Negative for abdominal pain and nausea.  Genitourinary:  Negative for difficulty urinating, frequency and vaginal pain.  Musculoskeletal:  Negative for back pain and gait problem.  Skin:  Negative for pallor and rash.  Neurological:  Negative for dizziness, tremors, weakness, numbness and headaches.  Psychiatric/Behavioral:  Negative for confusion and sleep disturbance.     Objective:  BP 130/80   Pulse (!) 51   Temp 97.7 F (36.5 C) (Oral)   Ht 5' 1 (1.549 m)   Wt 124 lb (56.2 kg)   SpO2 96%   BMI 23.43 kg/m   BP Readings from Last 3 Encounters:  09/19/23 130/80  04/17/23 132/66  11/28/22 (!) 180/78    Wt Readings from Last 3 Encounters:  09/19/23 124 lb (56.2 kg)  07/22/23 121 lb (54.9 kg)  04/17/23 121 lb 9.6 oz (55.2 kg)    Physical Exam Constitutional:      General: She is not in acute distress.    Appearance: Normal appearance. She is well-developed.  HENT:     Head: Normocephalic.     Right Ear: External ear normal.     Left Ear: External ear normal.  Nose: Nose normal.  Eyes:     General:        Right eye: No discharge.        Left eye: No discharge.     Conjunctiva/sclera: Conjunctivae normal.     Pupils: Pupils are equal, round, and reactive to light.  Neck:     Thyroid : No thyromegaly.     Vascular: No JVD.     Trachea: No tracheal deviation.  Cardiovascular:     Rate and Rhythm: Normal rate and regular rhythm.     Heart sounds: Normal heart sounds.  Pulmonary:     Effort: No respiratory distress.     Breath sounds: No stridor. No wheezing.  Abdominal:     General: Bowel sounds are normal. There is no distension.     Palpations: Abdomen is soft. There is no mass.     Tenderness: There is no abdominal tenderness. There is no guarding or rebound.  Musculoskeletal:        General:  No tenderness.     Cervical back: Normal range of motion and neck supple. No rigidity.  Lymphadenopathy:     Cervical: No cervical adenopathy.  Skin:    Findings: No erythema or rash.  Neurological:     Cranial Nerves: No cranial nerve deficit.     Motor: No abnormal muscle tone.     Coordination: Coordination normal.     Deep Tendon Reflexes: Reflexes normal.  Psychiatric:        Behavior: Behavior normal.        Thought Content: Thought content normal.        Judgment: Judgment normal.     Lab Results  Component Value Date   WBC 6.6 04/17/2023   HGB 13.1 04/17/2023   HCT 38.3 04/17/2023   PLT 264.0 04/17/2023   GLUCOSE 102 (H) 09/19/2023   CHOL 192 11/28/2022   TRIG 148.0 11/28/2022   HDL 68.30 11/28/2022   LDLDIRECT 84.1 07/20/2012   LDLCALC 94 11/28/2022   ALT 12 09/19/2023   AST 17 09/19/2023   NA 132 (L) 09/19/2023   K 4.0 09/19/2023   CL 94 (L) 09/19/2023   CREATININE 0.75 09/19/2023   BUN 16 09/19/2023   CO2 30 09/19/2023   TSH 1.52 09/19/2023    CT Head Wo Contrast Result Date: 01/02/2022 CLINICAL DATA:  New onset headache EXAM: CT HEAD WITHOUT CONTRAST TECHNIQUE: Contiguous axial images were obtained from the base of the skull through the vertex without intravenous contrast. RADIATION DOSE REDUCTION: This exam was performed according to the departmental dose-optimization program which includes automated exposure control, adjustment of the mA and/or kV according to patient size and/or use of iterative reconstruction technique. COMPARISON:  Head CT December 01, 2017 FINDINGS: Brain: Mild diffuse age-related global parenchymal volume loss. No evidence of acute infarction, hemorrhage, hydrocephalus, extra-axial collection or mass lesion/mass effect. Vascular: No hyperdense vessel or unexpected calcification. Skull: Normal. Negative for fracture or focal lesion. Sinuses/Orbits: Visualized portions of the paranasal sinuses and ethmoid air cells are predominantly clear.  Orbits are grossly unremarkable. Other: Mastoid air cells are predominantly clear. IMPRESSION: No acute intracranial abnormality. Electronically Signed   By: Reyes Holder M.D.   On: 01/02/2022 12:55    Assessment & Plan:   Problem List Items Addressed This Visit     Anxiety disorder - Primary   Paxil  10 mg/d  Continue with diazepam  5 mg twice daily as needed anxiety  Potential benefits of a long term benzodiazepines  use as  well as potential risks  and complications were explained to the patient and were aknowledged. Consider Remeron      Relevant Orders   TSH (Completed)   Asthma   No wheezing lately       Atherosclerosis of aorta (HCC)   Off Lipitor Fish oil      Relevant Medications   atorvastatin  (LIPITOR) 20 MG tablet   Claudication (HCC)   Relevant Orders   Comprehensive metabolic panel with GFR (Completed)   TSH (Completed)   HTN (hypertension)   Continue w/Metoprolol  -  50 mg bid, Valsartan  HCT 320/25 qd. On Amlodipine  5 mg/d      Relevant Medications   atorvastatin  (LIPITOR) 20 MG tablet   Other Relevant Orders   Comprehensive metabolic panel with GFR (Completed)   TSH (Completed)      No orders of the defined types were placed in this encounter.     Follow-up: Return in about 6 months (around 03/21/2024) for Wellness Exam.  Marolyn Noel, MD

## 2023-09-22 ENCOUNTER — Ambulatory Visit: Payer: Self-pay | Admitting: Internal Medicine

## 2023-09-28 NOTE — Assessment & Plan Note (Signed)
 Continue w/Metoprolol  -  50 mg bid, Valsartan  HCT 320/25 qd. On Amlodipine  5 mg/d

## 2023-09-28 NOTE — Assessment & Plan Note (Signed)
 Paxil 10 mg/d  Continue with diazepam 5 mg twice daily as needed anxiety  Potential benefits of a long term benzodiazepines  use as well as potential risks  and complications were explained to the patient and were aknowledged. Consider Remeron

## 2023-09-28 NOTE — Assessment & Plan Note (Signed)
 Off Lipitor Fish oil

## 2023-09-28 NOTE — Assessment & Plan Note (Signed)
 No wheezing lately

## 2023-11-24 ENCOUNTER — Ambulatory Visit (INDEPENDENT_AMBULATORY_CARE_PROVIDER_SITE_OTHER): Admitting: Internal Medicine

## 2023-11-24 ENCOUNTER — Encounter: Payer: Self-pay | Admitting: Internal Medicine

## 2023-11-24 VITALS — BP 124/84 | HR 56 | Temp 97.6°F | Ht 61.0 in | Wt 123.0 lb

## 2023-11-24 DIAGNOSIS — H6123 Impacted cerumen, bilateral: Secondary | ICD-10-CM

## 2023-11-24 DIAGNOSIS — H9193 Unspecified hearing loss, bilateral: Secondary | ICD-10-CM

## 2023-11-25 ENCOUNTER — Encounter: Payer: Self-pay | Admitting: Internal Medicine

## 2023-11-25 DIAGNOSIS — H6120 Impacted cerumen, unspecified ear: Secondary | ICD-10-CM | POA: Insufficient documentation

## 2023-11-25 DIAGNOSIS — H9190 Unspecified hearing loss, unspecified ear: Secondary | ICD-10-CM | POA: Insufficient documentation

## 2023-11-25 NOTE — Assessment & Plan Note (Signed)
 Getting hearing aids at [facility]

## 2023-11-25 NOTE — Assessment & Plan Note (Signed)
 See procedure Use Debrox once every 6 months prophylactically

## 2023-11-25 NOTE — Progress Notes (Signed)
 Subjective:  Patient ID: Whitney Benson, female    DOB: 07-18-1934  Age: 89 y.o. MRN: [id number]  CC: Ear Irrigation   HPI Whitney Benson presents for hearing loss.  She is here with her daughter Whitney Benson.  [facility] audiology advised him to have ear irrigation  Outpatient Medications Prior to Visit  Medication Sig Dispense Refill   amLODipine  (NORVASC ) 10 MG tablet Take 1 tablet (10 mg total) by mouth daily. 90 tablet 3   aspirin 81 MG tablet Take 81 mg by mouth daily.     atorvastatin  (LIPITOR) 20 MG tablet Take 20 mg by mouth every other day.     cholecalciferol (VITAMIN D ) 1000 units tablet Take 1,000 Units by mouth daily.     clobetasol ointment (TEMOVATE) 0.05 % SMARTSIG:sparingly Topical Twice Daily     diazepam  (VALIUM ) 5 MG tablet TAKE 1 TABLET BY MOUTH EVERY 6 HOURS AS NEEDED FOR ANXIETY DO NOT TAKE WITH ZOLPIDEM  30 tablet 3   Glucosamine Sulfate 500 MG TABS Take by oral route.     glucosamine-chondroitin 500-400 MG tablet Take 1 tablet by mouth daily.     metoprolol  succinate (TOPROL -XL) 50 MG 24 hr tablet Take 1 tablet (50 mg total) by mouth in the morning and at bedtime. Take with or immediately following a meal. 180 tablet 3   MULTIPLE VITAMIN PO Take by mouth.     pantoprazole  (PROTONIX ) 40 MG tablet Take 1 tablet (40 mg total) by mouth daily. 90 tablet 3   PARoxetine  (PAXIL ) 10 MG tablet TAKE 1 TABLET BY MOUTH DAILY (Patient taking differently: 1/2 tablet) 90 tablet 1   potassium chloride  SA (KLOR-CON  M) 20 MEQ tablet Take 1 tablet (20 mEq total) by mouth daily. 30 tablet 0   valsartan -hydrochlorothiazide  (DIOVAN -HCT) 320-25 MG tablet TAKE 1 TABLET BY MOUTH DAILY 90 tablet 2   zolpidem  (AMBIEN ) 5 MG tablet TAKE 1 TABLET BY MOUTH AT BEDTIME AS NEEDED FOR SLEEP, CAN REPEAT IN 4 HOURS IF NEEDED 180 tablet 0   No facility-administered medications prior to visit.    ROS: Review of Systems  HENT:  Positive for hearing loss. Negative for dental problem, ear discharge, ear pain and  voice change.     Objective:  BP 124/84 (BP Location: Left Arm, Patient Position: Sitting, Cuff Size: Normal)   Pulse (!) 56   Temp 97.6 F (36.4 C) (Oral)   Ht 5' 1 (1.549 m)   Wt 123 lb (55.8 kg)   SpO2 96%   BMI 23.24 kg/m   BP Readings from Last 3 Encounters:  11/24/23 124/84  09/19/23 130/80  04/17/23 132/66    Wt Readings from Last 3 Encounters:  11/24/23 123 lb (55.8 kg)  09/19/23 124 lb (56.2 kg)  07/22/23 121 lb (54.9 kg)    Physical Exam Constitutional:      Appearance: Normal appearance.  HENT:     Right Ear: Tympanic membrane, ear canal and external ear normal. There is impacted cerumen.     Left Ear: Tympanic membrane, ear canal and external ear normal. There is impacted cerumen.     Mouth/Throat:     Mouth: Mucous membranes are moist.     Pharynx: No oropharyngeal exudate or posterior oropharyngeal erythema.   Partial impaction bilaterally   Procedure Note :     Procedure :  Ear irrigation right and left ears   Indication:  Cerumen impaction right and left ears   Risks, including pain, dizziness, eardrum perforation, bleeding, infection and others  as well as benefits were explained to the patient in detail. Verbal consent was obtained and the patient agreed to proceed.    We used [employer] Ear Irrigation Device filled with lukewarm water for irrigation. A large amount wax was recovered from both ears. Procedure has also required manual wax removal/instrumentation with an ear wax curette and ear forceps on the right and left ears.   Tolerated well. Complications: None.   Postprocedure instructions :  Call if problems.   Lab Results  Component Value Date   WBC 6.6 04/17/2023   HGB 13.1 04/17/2023   HCT 38.3 04/17/2023   PLT 264.0 04/17/2023   GLUCOSE 102 (H) 09/19/2023   CHOL 192 11/28/2022   TRIG 148.0 11/28/2022   HDL 68.30 11/28/2022   LDLDIRECT 84.1 07/20/2012   LDLCALC 94 11/28/2022   ALT 12 09/19/2023   AST 17 09/19/2023   NA  132 (L) 09/19/2023   K 4.0 09/19/2023   CL 94 (L) 09/19/2023   CREATININE 0.75 09/19/2023   BUN 16 09/19/2023   CO2 30 09/19/2023   TSH 1.52 09/19/2023    CT Head Wo Contrast Result Date: 01/02/2022 CLINICAL DATA:  New onset headache EXAM: CT HEAD WITHOUT CONTRAST TECHNIQUE: Contiguous axial images were obtained from the base of the skull through the vertex without intravenous contrast. RADIATION DOSE REDUCTION: This exam was performed according to the departmental dose-optimization program which includes automated exposure control, adjustment of the mA and/or kV according to patient size and/or use of iterative reconstruction technique. COMPARISON:  Head CT December 01, 2017 FINDINGS: Brain: Mild diffuse age-related global parenchymal volume loss. No evidence of acute infarction, hemorrhage, hydrocephalus, extra-axial collection or mass lesion/mass effect. Vascular: No hyperdense vessel or unexpected calcification. Skull: Normal. Negative for fracture or focal lesion. Sinuses/Orbits: Visualized portions of the paranasal sinuses and ethmoid air cells are predominantly clear. Orbits are grossly unremarkable. Other: Mastoid air cells are predominantly clear. IMPRESSION: No acute intracranial abnormality. Electronically Signed   By: Whitney Benson M.D.   On: 01/02/2022 12:55    Assessment & Plan:   Problem List Items Addressed This Visit     Cerumen impaction   See procedure Use Debrox once every 6 months prophylactically      Hearing loss - Primary   Getting hearing aids at [facility]         No orders of the defined types were placed in this encounter.     Follow-up: No follow-ups on file.  Marolyn Noel, MD

## 2023-12-01 ENCOUNTER — Other Ambulatory Visit: Payer: Self-pay | Admitting: Internal Medicine

## 2023-12-10 ENCOUNTER — Other Ambulatory Visit: Payer: Self-pay | Admitting: Internal Medicine

## 2023-12-13 ENCOUNTER — Other Ambulatory Visit: Payer: Self-pay | Admitting: Internal Medicine

## 2024-01-19 ENCOUNTER — Other Ambulatory Visit: Payer: Self-pay | Admitting: Internal Medicine

## 2024-02-10 ENCOUNTER — Other Ambulatory Visit: Payer: Self-pay | Admitting: Internal Medicine

## 2024-03-17 ENCOUNTER — Other Ambulatory Visit: Payer: Self-pay | Admitting: Internal Medicine

## 2024-03-19 ENCOUNTER — Other Ambulatory Visit (HOSPITAL_COMMUNITY): Payer: Self-pay

## 2024-03-19 ENCOUNTER — Telehealth: Payer: Self-pay

## 2024-03-19 NOTE — Telephone Encounter (Signed)
 Pharmacy Patient Advocate Encounter   Received notification from [facility] KEY that prior authorization for Diazepam  5 mg is required/requested.   Insurance verification completed.   The patient is insured through [facility].   Per test claim: PA required; PA submitted to above mentioned insurance via Latent Key/confirmation #/EOC [id number] Status is pending

## 2024-03-22 ENCOUNTER — Other Ambulatory Visit (HOSPITAL_COMMUNITY): Payer: Self-pay

## 2024-03-22 NOTE — Telephone Encounter (Signed)
 Pharmacy Patient Advocate Encounter  Received notification from [facility] MEDIC[employer] that Prior Authorization for Diazepam  5 mg has been APPROVED from 03/19/2024 to 02/17/2025   PA #/Case ID/Reference #: [id number]

## 2024-04-06 ENCOUNTER — Ambulatory Visit: Admitting: Internal Medicine

## 2024-07-22 ENCOUNTER — Ambulatory Visit
